# Patient Record
Sex: Female | Born: 1935 | Race: Black or African American | Hispanic: No | Smoking: Former smoker
Health system: Southern US, Community
[De-identification: ages and names within clinical notes are randomized; demographics above are authoritative.]

## PROBLEM LIST (undated history)

## (undated) DIAGNOSIS — E785 Hyperlipidemia, unspecified: Secondary | ICD-10-CM

## (undated) DIAGNOSIS — R0603 Acute respiratory distress: Secondary | ICD-10-CM

## (undated) DIAGNOSIS — Z87891 Personal history of nicotine dependence: Secondary | ICD-10-CM

## (undated) DIAGNOSIS — C50311 Malignant neoplasm of lower-inner quadrant of right female breast: Secondary | ICD-10-CM

## (undated) DIAGNOSIS — I340 Nonrheumatic mitral (valve) insufficiency: Secondary | ICD-10-CM

## (undated) DIAGNOSIS — I10 Essential (primary) hypertension: Secondary | ICD-10-CM

## (undated) DIAGNOSIS — E119 Type 2 diabetes mellitus without complications: Secondary | ICD-10-CM

## (undated) DIAGNOSIS — N189 Chronic kidney disease, unspecified: Secondary | ICD-10-CM

## (undated) DIAGNOSIS — I5042 Chronic combined systolic (congestive) and diastolic (congestive) heart failure: Principal | ICD-10-CM

## (undated) DIAGNOSIS — I272 Pulmonary hypertension, unspecified: Secondary | ICD-10-CM

## (undated) DIAGNOSIS — Z170 Estrogen receptor positive status [ER+]: Secondary | ICD-10-CM

## (undated) DIAGNOSIS — I219 Acute myocardial infarction, unspecified: Secondary | ICD-10-CM

## (undated) DIAGNOSIS — I2510 Atherosclerotic heart disease of native coronary artery without angina pectoris: Secondary | ICD-10-CM

## (undated) DIAGNOSIS — D649 Anemia, unspecified: Secondary | ICD-10-CM

## (undated) HISTORY — DX: Atherosclerotic heart disease of native coronary artery without angina pectoris: I25.10

## (undated) HISTORY — DX: Acute myocardial infarction, unspecified: I21.9

## (undated) HISTORY — PX: COLONOSCOPY: SHX5424

## (undated) HISTORY — DX: Pulmonary hypertension, unspecified: I27.20

## (undated) HISTORY — DX: Chronic combined systolic (congestive) and diastolic (congestive) heart failure: I50.42

## (undated) HISTORY — PX: EYE SURGERY: SHX253

## (undated) HISTORY — DX: Estrogen receptor positive status (ER+): Z17.0

## (undated) HISTORY — DX: Nonrheumatic mitral (valve) insufficiency: I34.0

## (undated) HISTORY — DX: Malignant neoplasm of lower-inner quadrant of right female breast: C50.311

---

## 1998-06-22 ENCOUNTER — Other Ambulatory Visit: Admission: RE | Admit: 1998-06-22 | Discharge: 1998-06-22 | Payer: Self-pay | Admitting: Gastroenterology

## 2000-02-11 ENCOUNTER — Encounter: Admission: RE | Admit: 2000-02-11 | Discharge: 2000-05-11 | Payer: Self-pay | Admitting: Orthopedic Surgery

## 2000-05-18 ENCOUNTER — Encounter: Admission: RE | Admit: 2000-05-18 | Discharge: 2000-08-16 | Payer: Self-pay | Admitting: Orthopedic Surgery

## 2000-08-25 ENCOUNTER — Encounter: Admission: RE | Admit: 2000-08-25 | Discharge: 2000-11-23 | Payer: Self-pay | Admitting: Orthopedic Surgery

## 2000-12-15 ENCOUNTER — Encounter: Admission: RE | Admit: 2000-12-15 | Discharge: 2000-12-21 | Payer: Self-pay | Admitting: Orthopedic Surgery

## 2001-03-23 ENCOUNTER — Encounter: Admission: RE | Admit: 2001-03-23 | Discharge: 2001-03-29 | Payer: Self-pay | Admitting: Orthopedic Surgery

## 2001-07-13 ENCOUNTER — Encounter: Admission: RE | Admit: 2001-07-13 | Discharge: 2001-07-19 | Payer: Self-pay | Admitting: Orthopedic Surgery

## 2001-10-19 ENCOUNTER — Encounter: Admission: RE | Admit: 2001-10-19 | Discharge: 2001-10-25 | Payer: Self-pay | Admitting: Orthopedic Surgery

## 2002-02-06 ENCOUNTER — Encounter: Admission: RE | Admit: 2002-02-06 | Discharge: 2002-02-11 | Payer: Self-pay | Admitting: Orthopedic Surgery

## 2002-05-23 ENCOUNTER — Encounter (HOSPITAL_BASED_OUTPATIENT_CLINIC_OR_DEPARTMENT_OTHER): Admission: RE | Admit: 2002-05-23 | Discharge: 2002-05-27 | Payer: Self-pay | Admitting: Orthopedic Surgery

## 2002-09-05 ENCOUNTER — Encounter (HOSPITAL_BASED_OUTPATIENT_CLINIC_OR_DEPARTMENT_OTHER): Admission: RE | Admit: 2002-09-05 | Discharge: 2002-12-04 | Payer: Self-pay | Admitting: Internal Medicine

## 2002-12-09 ENCOUNTER — Encounter (HOSPITAL_BASED_OUTPATIENT_CLINIC_OR_DEPARTMENT_OTHER): Admission: RE | Admit: 2002-12-09 | Discharge: 2003-03-09 | Payer: Self-pay | Admitting: Internal Medicine

## 2003-06-15 ENCOUNTER — Encounter (HOSPITAL_BASED_OUTPATIENT_CLINIC_OR_DEPARTMENT_OTHER): Admission: RE | Admit: 2003-06-15 | Discharge: 2003-06-20 | Payer: Self-pay | Admitting: Internal Medicine

## 2003-09-21 ENCOUNTER — Encounter (HOSPITAL_BASED_OUTPATIENT_CLINIC_OR_DEPARTMENT_OTHER): Admission: RE | Admit: 2003-09-21 | Discharge: 2003-10-03 | Payer: Self-pay | Admitting: Internal Medicine

## 2004-01-04 ENCOUNTER — Encounter (HOSPITAL_BASED_OUTPATIENT_CLINIC_OR_DEPARTMENT_OTHER): Admission: RE | Admit: 2004-01-04 | Discharge: 2004-01-15 | Payer: Self-pay | Admitting: Internal Medicine

## 2004-04-10 ENCOUNTER — Encounter (HOSPITAL_BASED_OUTPATIENT_CLINIC_OR_DEPARTMENT_OTHER): Admission: RE | Admit: 2004-04-10 | Discharge: 2004-04-24 | Payer: Self-pay | Admitting: Internal Medicine

## 2004-07-17 ENCOUNTER — Encounter (HOSPITAL_BASED_OUTPATIENT_CLINIC_OR_DEPARTMENT_OTHER): Admission: RE | Admit: 2004-07-17 | Discharge: 2004-07-26 | Payer: Self-pay | Admitting: Internal Medicine

## 2004-10-23 ENCOUNTER — Encounter (HOSPITAL_BASED_OUTPATIENT_CLINIC_OR_DEPARTMENT_OTHER): Admission: RE | Admit: 2004-10-23 | Discharge: 2004-11-08 | Payer: Self-pay | Admitting: Internal Medicine

## 2005-05-29 ENCOUNTER — Ambulatory Visit: Payer: Self-pay | Admitting: Gastroenterology

## 2005-06-11 ENCOUNTER — Ambulatory Visit: Payer: Self-pay | Admitting: Gastroenterology

## 2005-06-11 ENCOUNTER — Encounter (INDEPENDENT_AMBULATORY_CARE_PROVIDER_SITE_OTHER): Payer: Self-pay | Admitting: *Deleted

## 2010-02-26 ENCOUNTER — Ambulatory Visit: Payer: Self-pay | Admitting: Vascular Surgery

## 2010-02-26 ENCOUNTER — Encounter (INDEPENDENT_AMBULATORY_CARE_PROVIDER_SITE_OTHER): Payer: Self-pay | Admitting: Internal Medicine

## 2010-02-26 ENCOUNTER — Ambulatory Visit (HOSPITAL_COMMUNITY): Admission: RE | Admit: 2010-02-26 | Discharge: 2010-02-26 | Payer: Self-pay | Admitting: Internal Medicine

## 2012-11-06 ENCOUNTER — Other Ambulatory Visit: Payer: Self-pay

## 2012-11-06 ENCOUNTER — Inpatient Hospital Stay (HOSPITAL_COMMUNITY): Payer: Medicare Other

## 2012-11-06 ENCOUNTER — Inpatient Hospital Stay (HOSPITAL_COMMUNITY)
Admission: EM | Admit: 2012-11-06 | Discharge: 2012-11-12 | DRG: 280 | Disposition: A | Discharge status: Home Health | Payer: Medicare Other | Attending: Internal Medicine | Admitting: Internal Medicine

## 2012-11-06 ENCOUNTER — Emergency Department (HOSPITAL_COMMUNITY): Payer: Medicare Other

## 2012-11-06 ENCOUNTER — Encounter (HOSPITAL_COMMUNITY): Payer: Self-pay | Admitting: *Deleted

## 2012-11-06 DIAGNOSIS — R079 Chest pain, unspecified: Secondary | ICD-10-CM

## 2012-11-06 DIAGNOSIS — I959 Hypotension, unspecified: Secondary | ICD-10-CM | POA: Diagnosis present

## 2012-11-06 DIAGNOSIS — J9600 Acute respiratory failure, unspecified whether with hypoxia or hypercapnia: Secondary | ICD-10-CM | POA: Diagnosis present

## 2012-11-06 DIAGNOSIS — K5900 Constipation, unspecified: Secondary | ICD-10-CM | POA: Diagnosis not present

## 2012-11-06 DIAGNOSIS — I272 Pulmonary hypertension, unspecified: Secondary | ICD-10-CM

## 2012-11-06 DIAGNOSIS — N179 Acute kidney failure, unspecified: Secondary | ICD-10-CM | POA: Diagnosis present

## 2012-11-06 DIAGNOSIS — I130 Hypertensive heart and chronic kidney disease with heart failure and stage 1 through stage 4 chronic kidney disease, or unspecified chronic kidney disease: Secondary | ICD-10-CM | POA: Diagnosis present

## 2012-11-06 DIAGNOSIS — I2510 Atherosclerotic heart disease of native coronary artery without angina pectoris: Secondary | ICD-10-CM | POA: Diagnosis present

## 2012-11-06 DIAGNOSIS — I2589 Other forms of chronic ischemic heart disease: Secondary | ICD-10-CM | POA: Diagnosis present

## 2012-11-06 DIAGNOSIS — N189 Chronic kidney disease, unspecified: Secondary | ICD-10-CM | POA: Diagnosis present

## 2012-11-06 DIAGNOSIS — E785 Hyperlipidemia, unspecified: Secondary | ICD-10-CM | POA: Diagnosis present

## 2012-11-06 DIAGNOSIS — J810 Acute pulmonary edema: Secondary | ICD-10-CM

## 2012-11-06 DIAGNOSIS — I359 Nonrheumatic aortic valve disorder, unspecified: Secondary | ICD-10-CM | POA: Diagnosis present

## 2012-11-06 DIAGNOSIS — D509 Iron deficiency anemia, unspecified: Secondary | ICD-10-CM | POA: Diagnosis present

## 2012-11-06 DIAGNOSIS — E876 Hypokalemia: Secondary | ICD-10-CM | POA: Diagnosis not present

## 2012-11-06 DIAGNOSIS — J811 Chronic pulmonary edema: Secondary | ICD-10-CM

## 2012-11-06 DIAGNOSIS — E119 Type 2 diabetes mellitus without complications: Secondary | ICD-10-CM

## 2012-11-06 DIAGNOSIS — I509 Heart failure, unspecified: Secondary | ICD-10-CM

## 2012-11-06 DIAGNOSIS — I340 Nonrheumatic mitral (valve) insufficiency: Secondary | ICD-10-CM

## 2012-11-06 DIAGNOSIS — I10 Essential (primary) hypertension: Secondary | ICD-10-CM

## 2012-11-06 DIAGNOSIS — I214 Non-ST elevation (NSTEMI) myocardial infarction: Secondary | ICD-10-CM | POA: Diagnosis present

## 2012-11-06 DIAGNOSIS — I5042 Chronic combined systolic (congestive) and diastolic (congestive) heart failure: Secondary | ICD-10-CM | POA: Diagnosis present

## 2012-11-06 DIAGNOSIS — I5031 Acute diastolic (congestive) heart failure: Secondary | ICD-10-CM

## 2012-11-06 DIAGNOSIS — I129 Hypertensive chronic kidney disease with stage 1 through stage 4 chronic kidney disease, or unspecified chronic kidney disease: Secondary | ICD-10-CM | POA: Diagnosis present

## 2012-11-06 DIAGNOSIS — I5023 Acute on chronic systolic (congestive) heart failure: Principal | ICD-10-CM | POA: Diagnosis present

## 2012-11-06 DIAGNOSIS — Z79899 Other long term (current) drug therapy: Secondary | ICD-10-CM

## 2012-11-06 DIAGNOSIS — N289 Disorder of kidney and ureter, unspecified: Secondary | ICD-10-CM

## 2012-11-06 DIAGNOSIS — N184 Chronic kidney disease, stage 4 (severe): Secondary | ICD-10-CM | POA: Diagnosis present

## 2012-11-06 DIAGNOSIS — I5021 Acute systolic (congestive) heart failure: Secondary | ICD-10-CM

## 2012-11-06 DIAGNOSIS — I309 Acute pericarditis, unspecified: Secondary | ICD-10-CM | POA: Diagnosis present

## 2012-11-06 HISTORY — DX: Type 2 diabetes mellitus without complications: E11.9

## 2012-11-06 HISTORY — DX: Anemia, unspecified: D64.9

## 2012-11-06 HISTORY — DX: Hyperlipidemia, unspecified: E78.5

## 2012-11-06 HISTORY — DX: Essential (primary) hypertension: I10

## 2012-11-06 HISTORY — DX: Personal history of nicotine dependence: Z87.891

## 2012-11-06 HISTORY — DX: Chronic kidney disease, unspecified: N18.9

## 2012-11-06 LAB — MRSA PCR SCREENING: MRSA by PCR: NEGATIVE

## 2012-11-06 LAB — POCT I-STAT, CHEM 8
BUN: 43 mg/dL — ABNORMAL HIGH (ref 6–23)
Creatinine, Ser: 1.7 mg/dL — ABNORMAL HIGH (ref 0.50–1.10)
Hemoglobin: 8.5 g/dL — ABNORMAL LOW (ref 12.0–15.0)
Potassium: 5.3 mEq/L — ABNORMAL HIGH (ref 3.5–5.1)
Sodium: 141 mEq/L (ref 135–145)

## 2012-11-06 LAB — CBC WITH DIFFERENTIAL/PLATELET
Basophils Relative: 0 % (ref 0–1)
Hemoglobin: 7 g/dL — ABNORMAL LOW (ref 12.0–15.0)
Lymphocytes Relative: 18 % (ref 12–46)
Lymphs Abs: 1 10*3/uL (ref 0.7–4.0)
Monocytes Relative: 9 % (ref 3–12)
Neutro Abs: 3.8 10*3/uL (ref 1.7–7.7)
Neutrophils Relative %: 71 % (ref 43–77)
RBC: 2.81 MIL/uL — ABNORMAL LOW (ref 3.87–5.11)
WBC: 5.3 10*3/uL (ref 4.0–10.5)

## 2012-11-06 LAB — POCT I-STAT 3, VENOUS BLOOD GAS (G3P V)
O2 Saturation: 49 %
pCO2, Ven: 59 mmHg — ABNORMAL HIGH (ref 45.0–50.0)
pO2, Ven: 30 mmHg (ref 30.0–45.0)

## 2012-11-06 LAB — CREATININE, SERUM
Creatinine, Ser: 1.67 mg/dL — ABNORMAL HIGH (ref 0.50–1.10)
GFR calc non Af Amer: 28 mL/min — ABNORMAL LOW (ref >90)

## 2012-11-06 LAB — CBC
MCH: 24.7 pg — ABNORMAL LOW (ref 26.0–34.0)
MCHC: 31.1 g/dL (ref 30.0–36.0)
Platelets: 172 10*3/uL (ref 150–400)
RBC: 2.95 MIL/uL — ABNORMAL LOW (ref 3.87–5.11)

## 2012-11-06 LAB — GLUCOSE, CAPILLARY
Glucose-Capillary: 157 mg/dL — ABNORMAL HIGH (ref 70–99)
Glucose-Capillary: 148 mg/dL — ABNORMAL HIGH (ref 70–99)

## 2012-11-06 LAB — POCT I-STAT TROPONIN I: Troponin i, poc: 0.05 ng/mL (ref 0.00–0.08)

## 2012-11-06 MED ORDER — ASPIRIN 81 MG PO CHEW
81.0000 mg | CHEWABLE_TABLET | Freq: Every day | ORAL | Status: DC
  Administered 2012-11-06 – 2012-11-09 (×4): 81 mg via ORAL
  Filled 2012-11-06: qty 4
  Filled 2012-11-06 (×4): qty 1

## 2012-11-06 MED ORDER — CHLORTHALIDONE 25 MG PO TABS
25.0000 mg | ORAL_TABLET | Freq: Every day | ORAL | Status: DC
  Administered 2012-11-06 – 2012-11-07 (×2): 25 mg via ORAL
  Filled 2012-11-06 (×2): qty 1

## 2012-11-06 MED ORDER — HEPARIN SODIUM (PORCINE) 5000 UNIT/ML IJ SOLN
5000.0000 [IU] | Freq: Three times a day (TID) | INTRAMUSCULAR | Status: DC
  Filled 2012-11-06 (×6): qty 1

## 2012-11-06 MED ORDER — MORPHINE SULFATE 2 MG/ML IJ SOLN: 1.0000 mg | INTRAMUSCULAR | Status: DC | PRN

## 2012-11-06 MED ORDER — FUROSEMIDE 10 MG/ML IJ SOLN
40.0000 mg | Freq: Once | INTRAMUSCULAR | Status: AC
  Administered 2012-11-06: 40 mg via INTRAVENOUS
  Filled 2012-11-06: qty 4

## 2012-11-06 MED ORDER — SODIUM CHLORIDE 0.9 % IJ SOLN: 3.0000 mL | INTRAMUSCULAR | Status: DC | PRN

## 2012-11-06 MED ORDER — GABAPENTIN 300 MG PO CAPS
300.0000 mg | ORAL_CAPSULE | Freq: Three times a day (TID) | ORAL | Status: DC
  Administered 2012-11-06 – 2012-11-12 (×20): 300 mg via ORAL
  Filled 2012-11-06 (×24): qty 1

## 2012-11-06 MED ORDER — AMLODIPINE BESYLATE 10 MG PO TABS
10.0000 mg | ORAL_TABLET | Freq: Every day | ORAL | Status: DC
  Administered 2012-11-06 – 2012-11-07 (×2): 10 mg via ORAL
  Filled 2012-11-06 (×2): qty 1

## 2012-11-06 MED ORDER — LEVOTHYROXINE SODIUM 50 MCG PO TABS
50.0000 ug | ORAL_TABLET | Freq: Every day | ORAL | Status: DC
  Administered 2012-11-06 – 2012-11-12 (×7): 50 ug via ORAL
  Filled 2012-11-06 (×8): qty 1

## 2012-11-06 MED ORDER — SIMVASTATIN 20 MG PO TABS
20.0000 mg | ORAL_TABLET | Freq: Every evening | ORAL | Status: DC
  Administered 2012-11-06 – 2012-11-07 (×2): 20 mg via ORAL
  Filled 2012-11-06 (×4): qty 1

## 2012-11-06 MED ORDER — OXYCODONE HCL 5 MG PO TABS
5.0000 mg | ORAL_TABLET | ORAL | Status: DC | PRN
  Administered 2012-11-10: 10 mg via ORAL
  Filled 2012-11-06: qty 1
  Filled 2012-11-06: qty 2

## 2012-11-06 MED ORDER — INSULIN ASPART 100 UNIT/ML ~~LOC~~ SOLN
0.0000 [IU] | Freq: Three times a day (TID) | SUBCUTANEOUS | Status: DC
  Administered 2012-11-06: 2 [IU] via SUBCUTANEOUS
  Administered 2012-11-08: 5 [IU] via SUBCUTANEOUS
  Administered 2012-11-09 – 2012-11-10 (×2): 2 [IU] via SUBCUTANEOUS
  Administered 2012-11-10: 3 [IU] via SUBCUTANEOUS
  Administered 2012-11-11: 2 [IU] via SUBCUTANEOUS
  Administered 2012-11-12: 1 [IU] via SUBCUTANEOUS
  Administered 2012-11-12: 2 [IU] via SUBCUTANEOUS

## 2012-11-06 MED ORDER — FUROSEMIDE 10 MG/ML IJ SOLN
60.0000 mg | Freq: Two times a day (BID) | INTRAMUSCULAR | Status: DC
  Administered 2012-11-06 – 2012-11-07 (×2): 60 mg via INTRAVENOUS
  Filled 2012-11-06 (×3): qty 6

## 2012-11-06 MED ORDER — DOXAZOSIN MESYLATE 4 MG PO TABS
4.0000 mg | ORAL_TABLET | Freq: Every day | ORAL | Status: DC
  Administered 2012-11-06: 4 mg via ORAL
  Filled 2012-11-06 (×2): qty 1

## 2012-11-06 MED ORDER — METFORMIN HCL 500 MG PO TABS: 1000.0000 mg | ORAL_TABLET | Freq: Two times a day (BID) | ORAL | Status: DC

## 2012-11-06 MED ORDER — ENOXAPARIN SODIUM 80 MG/0.8ML ~~LOC~~ SOLN
1.0000 mg/kg | SUBCUTANEOUS | Status: DC
  Administered 2012-11-06 – 2012-11-08 (×3): 65 mg via SUBCUTANEOUS
  Filled 2012-11-06 (×3): qty 0.8

## 2012-11-06 MED ORDER — ACETAMINOPHEN 325 MG PO TABS
650.0000 mg | ORAL_TABLET | Freq: Four times a day (QID) | ORAL | Status: DC | PRN
  Filled 2012-11-06: qty 2

## 2012-11-06 MED ORDER — SODIUM CHLORIDE 0.9 % IV SOLN: 250.0000 mL | INTRAVENOUS | Status: DC | PRN

## 2012-11-06 MED ORDER — LISINOPRIL 20 MG PO TABS: 40.0000 mg | ORAL_TABLET | Freq: Every day | ORAL | Status: DC

## 2012-11-06 MED ORDER — SODIUM CHLORIDE 0.9 % IJ SOLN
3.0000 mL | Freq: Two times a day (BID) | INTRAMUSCULAR | Status: DC
  Administered 2012-11-06 – 2012-11-08 (×5): 3 mL via INTRAVENOUS

## 2012-11-06 MED ORDER — SODIUM CHLORIDE 0.9 % IJ SOLN: 3.0000 mL | Freq: Two times a day (BID) | INTRAMUSCULAR | Status: DC

## 2012-11-06 MED ORDER — ATENOLOL 50 MG PO TABS
50.0000 mg | ORAL_TABLET | Freq: Every day | ORAL | Status: DC
  Administered 2012-11-06 – 2012-11-08 (×3): 50 mg via ORAL
  Filled 2012-11-06 (×4): qty 1

## 2012-11-06 NOTE — Progress Notes (Signed)
[facility] Progress Note [place] TEAM 1 - Stepdown/ICU TEAM   Mandy Larson [id number] DOB: [birth year] DOA: 11/06/2012 PCP: No primary provider on file.  Brief narrative: 78 y.o. female who presented with complaint of shortness of breath (SOB) onset over the past month. Her SOB was worse with exertion and better at rest, associated with L sided chest pain that was worse with exertion and better at rest. SOB also worse while lying down and better sitting up. Despite her family urging her for the past month to see a doctor she resisted until they called EMS who found patient in her house in the bathroom struggling to breath. Her initial O2 sat was 70% so they initated CPAP which brought her O2 to 99%. They gave her 1 sublingual nitro.  In the ED they gave her 40 of lasix twice with only minimal to moderate urine output. Her breathing did improve significantly on the BIPAP, but when they tried to wean it off she desaturated back down into the 80s on a NRB.   Assessment/Plan:  Pulmonary edema  Acute hypoxic resp failure  Troponin elevation  Chest pain Resolved at this time  Renal insufficiency  HTN  DM  Code Status: FULL Family Communication: No family present Disposition Plan: Step down unit  Consultants: None  Procedures: None  Antibiotics: None  DVT prophylaxis: Subcutaneous heparin  HPI/Subjective: Patient seen for followup visit   Objective: Blood pressure 113/66, pulse 65, temperature 98.6 F (37 C), temperature source Axillary, resp. rate 17, height 5\' 3"  (1.6 m), weight 64 kg (141 lb 1.5 oz), SpO2 99.00%.  Intake/Output Summary (Last 24 hours) at 11/06/12 1506 Last data filed at 11/06/12 1300  Gross per 24 hour  Intake    310 ml  Output   1450 ml  Net  -1140 ml     Exam: Followup exam completed  Data Reviewed: Basic Metabolic Panel:  Lab [id number] 0607 11/06/12 0205  NA -- 141  K -- 5.3*  CL -- 106  CO2 -- --  GLUCOSE -- 146*    BUN -- 43*  CREATININE 1.67* 1.70*  CALCIUM -- --  MG -- --  PHOS -- --   CBC:  Lab 11/06/12 0607 11/06/12 0205 11/06/12 0203  WBC 4.3 -- 5.3  NEUTROABS -- -- 3.8  HGB 7.3* 8.5* 7.0*  HCT 23.5* 25.0* 22.6*  MCV 79.7 -- 80.4  PLT 172 -- 185   Cardiac Enzymes:  Lab 11/06/12 0607  CKTOTAL --  CKMB --  CKMBINDEX --  TROPONINI 1.10*   BNP (last 3 results)  Basename 11/06/12 0203  PROBNP 6428.0*   CBG:  Lab 11/06/12 1130 11/06/12 0740  GLUCAP 160* 157*    Recent Results (from the past 240 hour(s))  MRSA PCR SCREENING     Status: Normal   Collection Time   11/06/12  8:04 AM      Component Value Range Status Comment   MRSA by PCR NEGATIVE  NEGATIVE Final      Studies:  Recent x-ray studies have been reviewed in detail by the Attending Physician  Scheduled Meds:  Reviewed in detail by the Attending Physician   Cherene Altes, MD [facility] Office  [phone] Pager [phone]  On-Call/Text Page:      [email or web address]      password [email or web address]  If 7PM-7AM, please contact night-coverage [email or web address] Password [email or web address] 11/06/2012, 3:06 PM   LOS: 0 days

## 2012-11-06 NOTE — ED Notes (Signed)
Respiratory at bedside for CPAP

## 2012-11-06 NOTE — ED Notes (Signed)
Admitting MD at bedside.

## 2012-11-06 NOTE — ED Notes (Addendum)
Per EMS: arrived to pt hourse and pt was in the bathroom in tripod position.  SOB x 2 hours.  Diminished in right lower quadrants, clear in left lungs.  O2 70% on nebulizer tx, with CPAP sats 99%. C/o CP, given 1 SL nitro.  RBBB found on EKG.  Pt received 125 mg Solumedrol, 5 mg Albuterol.

## 2012-11-06 NOTE — H&P (Addendum)
[facility] History and Physical  Mandy Larson [id number] DOB: [birth year] DOA: 11/06/2012  Referring physician: ED PCP: No primary provider on file.  Specialists: None  Chief Complaint: Shortness of breath  HPI: Mandy Larson is a 78 y.o. female who presents with complaint of shortness of breath (SOB) onset over the past month.  Her SOB is worse with exertion and better at rest.  Is associated with L sided chest pain that is worse with exertion and better at rest.  SOB is also worse while lying down and better sitting up.  Despite her family urging her for the past month to go see a doctor she resisted until earlier this evening they called EMS who found patient in her house in the bathroom struggling to breath.  Her initial O2 sat was 70% on a nebulizer treatment so they initated CPAP which brought her O2 to 99%.  They gave her 1 sublingual nitro no improvement.  In the ED they gave her 40 of lasix twice with only minimal to moderate urine output.  Her breathing did improve significantly on the BIPAP, but when they tried to wean it off she desaturated back down into the 80s on a NRB.  Hospitalist has been asked to admit for pulmonary edema.  Review of Systems: Patient denies: recent fever, chills, cough, coughing up sputum, body aches, muscle aches, flu like symptoms, admits to: chest pain, dyspnea on exertion, orthopnea, 12 systems reviewed and otherwise negative.  Past Medical History  Diagnosis Date  . Diabetes mellitus without complication    No past surgical history on file. Social History:  does not have a smoking history on file. She does not have any smokeless tobacco history on file. Her alcohol and drug histories not on file. Patient lives at home and performs all ADLs.   Allergies not on file  No family history on file. Husband is alive and well and in the room.  Prior to Admission medications   Medication Sig Start Date End Date Taking? Authorizing Provider    atenolol (TENORMIN) 50 MG tablet Take 50 mg by mouth daily.   Yes Historical Provider, MD  chlorthalidone (HYGROTON) 25 MG tablet Take 25 mg by mouth daily.   Yes Historical Provider, MD  doxazosin (CARDURA) 4 MG tablet Take 4 mg by mouth at bedtime.   Yes Historical Provider, MD  gabapentin (NEURONTIN) 300 MG capsule Take 300 mg by mouth 3 (three) times daily.   Yes Historical Provider, MD  levothyroxine (SYNTHROID, LEVOTHROID) 50 MCG tablet Take 50 mcg by mouth daily.   Yes Historical Provider, MD  lisinopril (PRINIVIL,ZESTRIL) 40 MG tablet Take 40 mg by mouth daily.   Yes Historical Provider, MD  metFORMIN (GLUCOPHAGE) 500 MG tablet Take 1,000 mg by mouth 2 (two) times daily with a meal.   Yes Historical Provider, MD  simvastatin (ZOCOR) 20 MG tablet Take 20 mg by mouth every evening.   Yes Historical Provider, MD  amLODipine (NORVASC) 10 MG tablet Take 10 mg by mouth daily.    Historical Provider, MD   Physical Exam: Filed Vitals:   11/06/12 0215 11/06/12 0245 11/06/12 0315 11/06/12 0330  BP: 129/64 126/62 126/64 126/81  Pulse: 73 72 67 68  Temp:      TempSrc:      Resp: 17 23 13 22   SpO2: 98% 95% 95% 97%     General:  NAD, resting comfortably in bed  Eyes: PEERLA EOMI  ENT: mucous membranes moist  Neck: supple w/o JVD  Cardiovascular: RRR w/o MRG  Respiratory: CTA B  Abdomen: soft, nt, nd, bs+  Skin: no rash nor lesion  Musculoskeletal: MAE, full ROM all 4 extremities  Psychiatric: normal tone and affect  Neurologic: AAOx3, grossly non-focal   Labs on Admission:  Basic Metabolic Panel:  Lab [id number] 0205  NA 141  K 5.3*  CL 106  CO2 --  GLUCOSE 146*  BUN 43*  CREATININE 1.70*  CALCIUM --  MG --  PHOS --   Liver Function Tests: No results found for this basename: AST:5,ALT:5,ALKPHOS:5,BILITOT:5,PROT:5,ALBUMIN:5 in the last 168 hours No results found for this basename: LIPASE:5,AMYLASE:5 in the last 168 hours No results found for this basename:  AMMONIA:5 in the last 168 hours CBC:  Lab 11/06/12 0205 11/06/12 0203  WBC -- 5.3  NEUTROABS -- 3.8  HGB 8.5* 7.0*  HCT 25.0* 22.6*  MCV -- 80.4  PLT -- 185   Cardiac Enzymes: No results found for this basename: CKTOTAL:5,CKMB:5,CKMBINDEX:5,TROPONINI:5 in the last 168 hours  BNP (last 3 results)  Basename 11/06/12 0203  PROBNP 6428.0*   CBG: No results found for this basename: GLUCAP:5 in the last 168 hours  Radiological Exams on Admission: Dg Chest Portable 1 View  11/06/2012  *RADIOLOGY REPORT*  Clinical Data: Respiratory distress.  PORTABLE CHEST - 1 VIEW  Comparison: None.  Findings: The lungs are relatively well expanded.  Bibasilar airspace opacities may reflect pneumonia or possibly pulmonary edema, given underlying vascular congestion and small bilateral pleural effusions.  No pneumothorax is seen.  The cardiomediastinal silhouette is enlarged.  Calcification is noted in the aortic arch.  No acute osseous abnormalities are identified.  IMPRESSION: Bibasilar airspace opacities may reflect pneumonia or possibly pulmonary edema, given underlying vascular congestion, cardiomegaly, and small bilateral pleural effusions.   Original Report Authenticated By: Santa Lighter, M.D.     EKG: Independently reviewed. Demonstrates a RBBB, no prior EKG available to compare with.  Assessment/Plan Principal Problem:  *Pulmonary edema Active Problems:  Chest pain  HTN (hypertension)  DM2 (diabetes mellitus, type 2)  Renal insufficiency   1. Pulmonary edema - new in the last month with episodes of crushing chest pain worse on exertion and better at rest on the left side of her chest.  I am very suspicious the patient may have CAD and even had MIs in the past few weeks since she had no prior history of CHF previously, this could alternatively be a hypertensive cardiomyopathy but it does sound rather sudden in onset by history.  Probably will end up needing to see a cardiologist / get a  heart cath (if they feel the latter is appropriate and indicated).  Lasix 60 q 12h IV ordered.  If BP rises may also benefit from Nitro Gtt 2. Chest pain - as above, continuing to check troponins, dont think she is having an active MI at this time but do think she probably did have one or several in the past couple of weeks. 3. Renal insufficiency - hold lisinopril, hold metformin, unclear wether this is acute or chronic in this patient, she is on metformin at baseline, strict I's and O's, patient is clearly volume overloaded so do not feel that this is due to volume depletion.  Could also be some element of cardio-renal syndrome if the patient does indeed have low cardiac output.  Also evaluating for RAS with renal ultrasound given the fact that she takes 5 HTN meds at home. 4. HTN - patient on multiple home medications, will continue these  as her BP is in the 120s in the ED at this time except for lisinopril as above. 5. DM2 - hold metformin and put on SSI.  No consults called.  Code Status: Full Code (must indicate code status--if unknown or must be presumed, indicate so) Family Communication: Spoke with husband at bedside (indicate person spoken with, if applicable, with phone number if by telephone) Disposition Plan: Admit to stepdown, inpatient (indicate anticipated LOS)  Time spent: 70 min  GARDNER, JARED M. [facility] Pager [phone]  If 7PM-7AM, please contact night-coverage [email or web address] Password [facility] 11/06/2012, 6:14 AM

## 2012-11-06 NOTE — Plan of Care (Signed)
Problem: Phase I Progression Outcomes Goal: Other Phase I Outcomes/Goals Outcome: Progressing Sats maintained >90% off bipap. Tolerating light diet. Pt c/o LE intermittent LE cramping. During cramp event pt found OOB standing to relieve pain. Bed alarm on. Family updated on POC.

## 2012-11-06 NOTE — Progress Notes (Signed)
PT BROUGHT IN ON CPAP AND PLACE4D ON OUR MACHINE AT THIS TIME ON ABOVE SETTINGS.

## 2012-11-06 NOTE — Progress Notes (Signed)
ANTICOAGULATION CONSULT NOTE - Initial Consult  Pharmacy Consult for lovenox Indication: chest pain/ACS  No Known Allergies  Patient Measurements: Height: 5\' 3"  (160 cm) Weight: 141 lb 1.5 oz (64 kg) IBW/kg (Calculated) : 52.4   Vital Signs: Temp: 98.6 F (37 C) (12/28 1230) Temp src: Axillary (12/28 1128) BP: 113/66 mmHg (12/28 1230) Pulse Rate: 65  (12/28 1230)  Labs:  Basename 11/06/12 1216 11/06/12 0607 11/06/12 0205 11/06/12 0203  HGB -- 7.3* 8.5* --  HCT -- 23.5* 25.0* 22.6*  PLT -- 172 -- 185  APTT -- -- -- --  LABPROT -- -- -- --  INR -- -- -- --  HEPARINUNFRC -- -- -- --  CREATININE -- 1.67* 1.70* --  CKTOTAL -- -- -- --  CKMB -- -- -- --  TROPONINI 0.92* 1.10* -- --    Estimated Creatinine Clearance: 24.2 ml/min (by C-G formula based on Cr of 1.67).   Medical History: Past Medical History  Diagnosis Date  . Diabetes mellitus without complication     Medications:  Scheduled:    . amLODipine  10 mg Oral Daily  . aspirin  81 mg Oral Daily  . atenolol  50 mg Oral Daily  . chlorthalidone  25 mg Oral Daily  . doxazosin  4 mg Oral QHS  . [COMPLETED] furosemide  40 mg Intravenous Once  . [COMPLETED] furosemide  40 mg Intravenous Once  . furosemide  60 mg Intravenous Q12H  . gabapentin  300 mg Oral TID  . insulin aspart  0-9 Units Subcutaneous TID WC  . levothyroxine  50 mcg Oral QAC breakfast  . simvastatin  20 mg Oral QPM  . sodium chloride  3 mL Intravenous Q12H  . [DISCONTINUED] heparin  5,000 Units Subcutaneous Q8H  . [DISCONTINUED] lisinopril  40 mg Oral Daily  . [DISCONTINUED] metFORMIN  1,000 mg Oral BID WC  . [DISCONTINUED] sodium chloride  3 mL Intravenous Q12H    Assessment: 78 yo who was admitted for CP. Has an elevated troponin. Lovenox has been ordered to r/o MI.  Goal of Therapy:  Anti-Xa level 0.6-1.2 units/ml 4hrs after LMWH dose given Monitor platelets by anticoagulation protocol: Yes   Plan:  Lovenox 65mg  SQ q24 CBC  q3days  Onnie Boer [place] 11/06/2012,3:40 PM

## 2012-11-06 NOTE — ED Provider Notes (Addendum)
History     CSN: [id number]  Arrival date & time 11/06/12  0146   None     Chief Complaint  Patient presents with  . Respiratory Distress    (Consider location/radiation/quality/duration/timing/severity/associated sxs/prior treatment) HPI Level 5 Caveat: on CPAP, respiratory distress. This is an 78 year old female with about a one-month history of shortness of breath. She had not sought medical attention for this. She is here with an acute worsening of the shortness of breath that began about 2 hours ago. The symptoms were severe. EMS found her to be in the bathroom sitting in a tripod position in obvious distress. Her oxygen saturation was 76% on room air. It came up to 85% on a nonrebreather. Due to continued hypoxia and distress she was placed on CPAP. On CPAP her oxygen came up to 99%. Her dyspnea has improved although she is still tachypneic. She did have some chest pain earlier and was given one sublingual nitroglycerin. She longer has chest pain. She was given Solu-Medrol 125 mg IV and started on an albuterol neb treatment prior to arrival. EKG done prior to arrival showed a right bundle branch block.  Past Medical History  Diagnosis Date  . Diabetes mellitus without complication     No past surgical history on file.  No family history on file.  History  Substance Use Topics  . Smoking status: Not on file  . Smokeless tobacco: Not on file  . Alcohol Use:     OB History    Grav Para Term Preterm Abortions TAB SAB Ect Mult Living                  Review of Systems  Unable to perform ROS   Allergies  Review of patient's allergies indicates not on file.  Home Medications  No current outpatient prescriptions on file.  BP 129/64  Pulse 73  Temp 96.7 F (35.9 C) (Rectal)  Resp 17  SpO2 98%  Physical Exam General: Well-developed, well-nourished female in mild distress; appearance consistent with age of record HENT: normocephalic, atraumatic Eyes: pupils  equal round and reactive to light; extraocular muscles intact; arcus senilis bilaterally Neck: supple Heart: regular rate and rhythm Lungs: On CPAP; diminished sounds on the right, rales left base; tachypnea Abdomen: soft; nondistended; nontender; bowel sounds present Extremities: No deformity; full range of motion; pulses normal Neurologic: Awake, alert; motor function intact in all extremities and symmetric; no facial droop Skin: Warm and dry    ED Course  Procedures (including critical care time)  CRITICAL CARE Performed by: Kiri Hinderliter L   Total critical care time: 30 minutes  Critical care time was exclusive of separately billable procedures and treating other patients.  Critical care was necessary to treat or prevent imminent or life-threatening deterioration.  Critical care was time spent personally by me on the following activities: development of treatment plan with patient and/or surrogate as well as nursing, discussions with consultants, evaluation of patient's response to treatment, examination of patient, obtaining history from patient or surrogate, ordering and performing treatments and interventions, ordering and review of laboratory studies, ordering and review of radiographic studies, pulse oximetry and re-evaluation of patient's condition.    MDM   Nursing notes and vitals signs, including pulse oximetry, reviewed.  Summary of this visit's results, reviewed by myself:  Labs:  Results for orders placed during the hospital encounter of 11/06/12 (from the past 24 hour(s))  CBC WITH DIFFERENTIAL     Status: Abnormal   Collection Time  11/06/12  2:03 AM      Component Value Range   WBC 5.3  4.0 - 10.5 K/uL   RBC 2.81 (*) 3.87 - 5.11 MIL/uL   Hemoglobin 7.0 (*) 12.0 - 15.0 g/dL   HCT 22.6 (*) 36.0 - 46.0 %   MCV 80.4  78.0 - 100.0 fL   MCH 24.9 (*) 26.0 - 34.0 pg   MCHC 31.0  30.0 - 36.0 g/dL   RDW 16.6 (*) 11.5 - 15.5 %   Platelets 185  150 - 400 K/uL    Neutrophils Relative 71  43 - 77 %   Neutro Abs 3.8  1.7 - 7.7 K/uL   Lymphocytes Relative 18  12 - 46 %   Lymphs Abs 1.0  0.7 - 4.0 K/uL   Monocytes Relative 9  3 - 12 %   Monocytes Absolute 0.5  0.1 - 1.0 K/uL   Eosinophils Relative 2  0 - 5 %   Eosinophils Absolute 0.1  0.0 - 0.7 K/uL   Basophils Relative 0  0 - 1 %   Basophils Absolute 0.0  0.0 - 0.1 K/uL  PRO B NATRIURETIC PEPTIDE     Status: Abnormal   Collection Time   11/06/12  2:03 AM      Component Value Range   Pro B Natriuretic peptide (BNP) 6428.0 (*) 0 - 450 pg/mL  POCT I-STAT, CHEM 8     Status: Abnormal   Collection Time   11/06/12  2:05 AM      Component Value Range   Sodium 141  135 - 145 mEq/L   Potassium 5.3 (*) 3.5 - 5.1 mEq/L   Chloride 106  96 - 112 mEq/L   BUN 43 (*) 6 - 23 mg/dL   Creatinine, Ser 1.70 (*) 0.50 - 1.10 mg/dL   Glucose, Bld 146 (*) 70 - 99 mg/dL   Calcium, Ion 1.19  1.13 - 1.30 mmol/L   TCO2 29  0 - 100 mmol/L   Hemoglobin 8.5 (*) 12.0 - 15.0 g/dL   HCT 25.0 (*) 36.0 - 46.0 %  POCT I-STAT 3, BLOOD GAS (G3P V)     Status: Abnormal   Collection Time   11/06/12  2:20 AM      Component Value Range   pH, Ven 7.296  7.250 - 7.300   pCO2, Ven 59.0 (*) 45.0 - 50.0 mmHg   pO2, Ven 30.0  30.0 - 45.0 mmHg   Bicarbonate 28.8 (*) 20.0 - 24.0 mEq/L   TCO2 31  0 - 100 mmol/L   O2 Saturation 49.0     Acid-Base Excess 2.0  0.0 - 2.0 mmol/L   Sample type VENOUS     Comment NOTIFIED PHYSICIAN    POCT I-STAT TROPONIN I     Status: Normal   Collection Time   11/06/12  2:32 AM      Component Value Range   Troponin i, poc 0.05  0.00 - 0.08 ng/mL   Comment 3             Imaging Studies: Dg Chest Portable 1 View  11/06/2012  *RADIOLOGY REPORT*  Clinical Data: Respiratory distress.  PORTABLE CHEST - 1 VIEW  Comparison: None.  Findings: The lungs are relatively well expanded.  Bibasilar airspace opacities may reflect pneumonia or possibly pulmonary edema, given underlying vascular congestion and small  bilateral pleural effusions.  No pneumothorax is seen.  The cardiomediastinal silhouette is enlarged.  Calcification is noted in the aortic arch.  No  acute osseous abnormalities are identified.  IMPRESSION: Bibasilar airspace opacities may reflect pneumonia or possibly pulmonary edema, given underlying vascular congestion, cardiomegaly, and small bilateral pleural effusions.   Original Report Authenticated By: Santa Lighter, M.D.       EKG Interpretation:  Date & Time: 11/06/2012 1:53 AM  Rate: 77  Rhythm: normal sinus rhythm  QRS Axis: normal  Intervals: normal  ST/T Wave abnormalities: normal  Conduction Disutrbances:right bundle branch block  Narrative Interpretation:   Old EKG Reviewed: none available  2:10 AM Patient placed on BiPAP on arrival. Patient has no history of COPD or congestive heart failure. Her chest x-ray shows changes consistent with asymmetric pulmonary edema. Lasix 40 mg IV has been ordered.  3:23 AM No urine output despite Lasix 40 mg IV. Will repeat Lasix. Patient failed trial off BiPAP. We will have her admitted.  5:00 AM Urine output about 375 mL after second dose of IV Lasix.        Wynetta Fines, MD 11/06/12 [id number]  Wynetta Fines, MD 11/06/12 [place], MD 11/06/12 0500

## 2012-11-06 NOTE — Progress Notes (Signed)
Trial on [place] at 6 lpm but sats dropped to 88%, replaced bipap at this time.

## 2012-11-07 DIAGNOSIS — I10 Essential (primary) hypertension: Secondary | ICD-10-CM

## 2012-11-07 LAB — BASIC METABOLIC PANEL
BUN: 43 mg/dL — ABNORMAL HIGH (ref 6–23)
GFR calc Af Amer: 32 mL/min — ABNORMAL LOW (ref >90)
GFR calc non Af Amer: 27 mL/min — ABNORMAL LOW (ref >90)
Potassium: 3.7 mEq/L (ref 3.5–5.1)

## 2012-11-07 LAB — CBC
MCH: 25.2 pg — ABNORMAL LOW (ref 26.0–34.0)
MCHC: 32 g/dL (ref 30.0–36.0)
Platelets: 167 10*3/uL (ref 150–400)
RBC: 2.86 MIL/uL — ABNORMAL LOW (ref 3.87–5.11)

## 2012-11-07 MED ORDER — NITROGLYCERIN 2 % TD OINT
0.5000 [in_us] | TOPICAL_OINTMENT | Freq: Four times a day (QID) | TRANSDERMAL | Status: DC
  Administered 2012-11-07 – 2012-11-08 (×3): 0.5 [in_us] via TOPICAL
  Filled 2012-11-07 (×2): qty 30

## 2012-11-07 MED ORDER — DOXAZOSIN MESYLATE 2 MG PO TABS
2.0000 mg | ORAL_TABLET | Freq: Every day | ORAL | Status: DC
  Administered 2012-11-07: 2 mg via ORAL
  Filled 2012-11-07 (×2): qty 1

## 2012-11-07 MED ORDER — FUROSEMIDE 10 MG/ML IJ SOLN
40.0000 mg | Freq: Two times a day (BID) | INTRAMUSCULAR | Status: DC
  Administered 2012-11-07 – 2012-11-09 (×4): 40 mg via INTRAVENOUS
  Filled 2012-11-07 (×5): qty 4

## 2012-11-07 NOTE — Plan of Care (Signed)
Problem: Phase II Progression Outcomes Goal: IV changed to normal saline lock Outcome: Not Applicable Date Met:  0000000 Pt tolerated OOB in chair for 4 hours; assisted with ADLs. C/o left chest pain, resolved with repositioning back to bed. Will monitor.

## 2012-11-07 NOTE — Progress Notes (Signed)
  Echocardiogram 2D Echocardiogram has been performed.  Esker Dever FRANCES 11/07/2012, 5:10 PM

## 2012-11-07 NOTE — Progress Notes (Signed)
[facility] Progress Note [place] TEAM 1 - Stepdown/ICU TEAM   Maize Lynk [id number] DOB: Feb 09, 1930 DOA: 11/06/2012 PCP: No primary provider on file.  Brief narrative: 78 y.o. female who presented with complaint of shortness of breath (SOB) onset over the past month. Her SOB was worse with exertion and better at rest, associated with L sided chest pain that was worse with exertion and better at rest. SOB also worse while lying down and better sitting up. Despite her family urging her for the past month to see a doctor she resisted until they called EMS who found patient in her house in the bathroom struggling to breath. Her initial O2 sat was 70% so they initated CPAP which brought her O2 to 99%. They gave her 1 sublingual nitro.  In the ED they gave her 40 of lasix twice with only minimal to moderate urine output. Her breathing did improve significantly on the BIPAP, but when they tried to wean it off she desaturated back down into the 80s on a NRB.   Assessment/Plan:  Pulmonary edema Likely the result of newly diagnosed congestive heart failure - clinically patient is stabilizing though slowly - further evaluation will be determined based upon the results of the pending echocardiogram - patient is negative net 3 L thus far  Acute hypoxic resp failure Due to above - patient required BiPAP again last night - we'll attempt to wean to nasal cannula today - continue diuresis  Troponin elevation Mild - is likely due to CHF exacerbation but actual NSTEMI is certainly a possibility - risk stratification for coronary artery disease will likely be indicated if systolic congestive heart failure noted on echocardiogram - empiric medical therapy to continue  Chest pain Resolved at this time - troponin only mildly elevated - EKG without acute ST segment changes - see discussion above concerning eventual risk stratification  Renal insufficiency Creatinine is stable in face of diuresis -  continue to follow  Normocytic anemia No clear evidence of blood loss - guaiac stools - check anemia panel - will need to consider transfusion to keep hemoglobin greater than 8.0 if patient proven to have coronary artery disease  HTN Well-controlled at present time with borderline hypotension  DM Reasonably well controlled at present - no change in treatment plan today  Code Status: FULL Family Communication: No family present Disposition Plan: Step down unit  Consultants: None  Procedures: None  Antibiotics: None  DVT prophylaxis: Lovenox  HPI/Subjective: The patient is alert and conversant.  She complains of intermittent left-sided chest pain but describes as more of a pleuritic sensation which is positional and cough related.  She denies severe abdominal pain nausea or vomiting.  She feels that her shortness of breath is slowly improving.   Objective: Blood pressure 92/53, pulse 57, temperature 98.6 F (37 C), temperature source Axillary, resp. rate 13, height 5\' 3"  (1.6 m), weight 62 kg (136 lb 11 oz), SpO2 98.00%.  Intake/Output Summary (Last 24 hours) at 11/07/12 1356 Last data filed at 11/07/12 1338  Gross per 24 hour  Intake    742 ml  Output   2675 ml  Net  -1933 ml     Exam: General: No acute respiratory distress at rest but still requiring Venturi mask for hypoxia Lungs: Distant breath sounds throughout all fields with mild fine crackles no wheeze Cardiovascular: Regular rate and rhythm with prominent S2 appreciable Abdomen: Nontender, nondistended, soft, bowel sounds positive, no rebound, no ascites, no appreciable mass Extremities: No significant  cyanosis, clubbing, or edema bilateral lower extremities  Data Reviewed: Basic Metabolic Panel:  Lab 0000000 0419 11/06/12 0607 11/06/12 0205  NA 143 -- 141  K 3.7 -- 5.3*  CL 102 -- 106  CO2 29 -- --  GLUCOSE 106* -- 146*  BUN 43* -- 43*  CREATININE 1.70* 1.67* 1.70*  CALCIUM 9.0 -- --  MG -- --  --  PHOS -- -- --   CBC:  Lab 11/07/12 0419 11/06/12 0607 11/06/12 0205 11/06/12 0203  WBC 4.2 4.3 -- 5.3  NEUTROABS -- -- -- 3.8  HGB 7.2* 7.3* 8.5* 7.0*  HCT 22.5* 23.5* 25.0* 22.6*  MCV 78.7 79.7 -- 80.4  PLT 167 172 -- 185   Cardiac Enzymes:  Lab 11/07/12 0419 11/06/12 1750 11/06/12 1216 11/06/12 0607  CKTOTAL -- -- -- --  CKMB -- -- -- --  CKMBINDEX -- -- -- --  TROPONINI 0.71* 0.84* 0.92* 1.10*   BNP (last 3 results)  Basename 11/06/12 0203  PROBNP 6428.0*   CBG:  Lab 11/07/12 1151 11/06/12 2127 11/06/12 1607 11/06/12 1130 11/06/12 0740  GLUCAP 140* 121* 148* 160* 157*    Recent Results (from the past 240 hour(s))  MRSA PCR SCREENING     Status: Normal   Collection Time   11/06/12  8:04 AM      Component Value Range Status Comment   MRSA by PCR NEGATIVE  NEGATIVE Final      Studies:  Recent x-ray studies have been reviewed in detail by the Attending Physician  Scheduled Meds:  Reviewed in detail by the Attending Physician   Cherene Altes, MD [facility] Office  [phone] Pager [phone]  On-Call/Text Page:      [email or web address]      password [email or web address]  If 7PM-7AM, please contact night-coverage [email or web address] Password [email or web address] 11/07/2012, 1:56 PM   LOS: 1 day

## 2012-11-08 ENCOUNTER — Inpatient Hospital Stay (HOSPITAL_COMMUNITY): Payer: Medicare Other

## 2012-11-08 ENCOUNTER — Encounter (HOSPITAL_COMMUNITY): Payer: Self-pay | Admitting: Internal Medicine

## 2012-11-08 DIAGNOSIS — I272 Pulmonary hypertension, unspecified: Secondary | ICD-10-CM

## 2012-11-08 DIAGNOSIS — I5021 Acute systolic (congestive) heart failure: Secondary | ICD-10-CM

## 2012-11-08 DIAGNOSIS — I509 Heart failure, unspecified: Secondary | ICD-10-CM

## 2012-11-08 DIAGNOSIS — I340 Nonrheumatic mitral (valve) insufficiency: Secondary | ICD-10-CM | POA: Diagnosis present

## 2012-11-08 DIAGNOSIS — N289 Disorder of kidney and ureter, unspecified: Secondary | ICD-10-CM

## 2012-11-08 DIAGNOSIS — I309 Acute pericarditis, unspecified: Secondary | ICD-10-CM | POA: Diagnosis present

## 2012-11-08 DIAGNOSIS — I5042 Chronic combined systolic (congestive) and diastolic (congestive) heart failure: Secondary | ICD-10-CM | POA: Diagnosis present

## 2012-11-08 DIAGNOSIS — R079 Chest pain, unspecified: Secondary | ICD-10-CM

## 2012-11-08 DIAGNOSIS — I5031 Acute diastolic (congestive) heart failure: Secondary | ICD-10-CM | POA: Diagnosis present

## 2012-11-08 HISTORY — DX: Nonrheumatic mitral (valve) insufficiency: I34.0

## 2012-11-08 HISTORY — DX: Pulmonary hypertension, unspecified: I27.20

## 2012-11-08 HISTORY — DX: Chronic combined systolic (congestive) and diastolic (congestive) heart failure: I50.42

## 2012-11-08 LAB — GLUCOSE, CAPILLARY
Glucose-Capillary: 108 mg/dL — ABNORMAL HIGH (ref 70–99)
Glucose-Capillary: 79 mg/dL (ref 70–99)
Glucose-Capillary: 161 mg/dL — ABNORMAL HIGH (ref 70–99)

## 2012-11-08 LAB — IRON AND TIBC
Iron: 31 ug/dL — ABNORMAL LOW (ref 42–135)
Saturation Ratios: 10 % — ABNORMAL LOW (ref 20–55)
UIBC: 290 ug/dL (ref 125–400)

## 2012-11-08 LAB — FOLATE: Folate: >20 ng/mL

## 2012-11-08 LAB — BASIC METABOLIC PANEL
CO2: 37 mEq/L — ABNORMAL HIGH (ref 19–32)
Calcium: 9.4 mg/dL (ref 8.4–10.5)
Glucose, Bld: 103 mg/dL — ABNORMAL HIGH (ref 70–99)
Sodium: 143 mEq/L (ref 135–145)

## 2012-11-08 LAB — PREPARE RBC (CROSSMATCH)

## 2012-11-08 LAB — RETICULOCYTES: RBC.: 3.35 MIL/uL — ABNORMAL LOW (ref 3.87–5.11)

## 2012-11-08 MED ORDER — ENOXAPARIN SODIUM 30 MG/0.3ML ~~LOC~~ SOLN
30.0000 mg | SUBCUTANEOUS | Status: DC
  Filled 2012-11-08: qty 0.3

## 2012-11-08 MED ORDER — HYDRALAZINE HCL 10 MG PO TABS
10.0000 mg | ORAL_TABLET | Freq: Three times a day (TID) | ORAL | Status: DC
  Administered 2012-11-08 – 2012-11-09 (×3): 10 mg via ORAL
  Filled 2012-11-08 (×6): qty 1

## 2012-11-08 MED ORDER — ATORVASTATIN CALCIUM 40 MG PO TABS
40.0000 mg | ORAL_TABLET | Freq: Every day | ORAL | Status: DC
  Administered 2012-11-08 – 2012-11-12 (×5): 40 mg via ORAL
  Filled 2012-11-08 (×5): qty 1

## 2012-11-08 MED ORDER — POTASSIUM CHLORIDE CRYS ER 20 MEQ PO TBCR
40.0000 meq | EXTENDED_RELEASE_TABLET | Freq: Once | ORAL | Status: AC
  Administered 2012-11-08: 40 meq via ORAL
  Filled 2012-11-08: qty 2

## 2012-11-08 MED ORDER — CYANOCOBALAMIN 1000 MCG/ML IJ SOLN
1000.0000 ug | Freq: Every day | INTRAMUSCULAR | Status: DC
  Administered 2012-11-08 – 2012-11-12 (×5): 1000 ug via SUBCUTANEOUS
  Filled 2012-11-08 (×5): qty 1

## 2012-11-08 MED ORDER — POTASSIUM CHLORIDE CRYS ER 20 MEQ PO TBCR
20.0000 meq | EXTENDED_RELEASE_TABLET | Freq: Two times a day (BID) | ORAL | Status: DC
  Administered 2012-11-08 – 2012-11-11 (×6): 20 meq via ORAL
  Filled 2012-11-08 (×7): qty 1

## 2012-11-08 MED ORDER — CARVEDILOL 6.25 MG PO TABS
6.2500 mg | ORAL_TABLET | Freq: Two times a day (BID) | ORAL | Status: DC
  Administered 2012-11-09 – 2012-11-11 (×3): 6.25 mg via ORAL
  Filled 2012-11-08 (×7): qty 1

## 2012-11-08 MED ORDER — ISOSORBIDE MONONITRATE ER 30 MG PO TB24
30.0000 mg | ORAL_TABLET | Freq: Every day | ORAL | Status: DC
  Administered 2012-11-08 – 2012-11-12 (×5): 30 mg via ORAL
  Filled 2012-11-08 (×5): qty 1

## 2012-11-08 NOTE — Progress Notes (Signed)
Report called to [facility] 4700 RN.

## 2012-11-08 NOTE — Progress Notes (Addendum)
1500 Transferred from 2600 per wc alert oriented Placed on tele NSR 98.1 57 20 95/46. 1730 labs being drawn for type and screen to receive 1 unit prbc's when ready.

## 2012-11-08 NOTE — Progress Notes (Signed)
Pt transferred to 4710 via wheelchair. No IV, O2 at 2L. Meds in chart. VS stable upon transfer. Family updated on new room number. No current question or complaints at this time. Mandy Larson

## 2012-11-08 NOTE — Clinical Documentation Improvement (Signed)
CHF DOCUMENTATION CLARIFICATION QUERY  THIS DOCUMENT IS NOT A PERMANENT PART OF THE MEDICAL RECORD  TO RESPOND TO THE THIS QUERY, FOLLOW THE INSTRUCTIONS BELOW:  1. If needed, update documentation for the patient's encounter via the notes activity.  2. Access this query again and click edit on the [facility] [employer].  3. After updating, or not, click F2 to complete all highlighted (required) fields concerning your review. Select "additional documentation in the medical record" OR "no additional documentation provided".  4. Click Sign note button.  5. The deficiency will fall out of your In Basket *Please let us know if you are not able to complete this workflow by phone or e-mail (listed below).  Please update your documentation within the medical record to reflect your response to this query.                                                                                     11/08/12  Dear Dr. [facility],  In a better effort to capture your patient's severity of illness, reflect appropriate length of stay and utilization of resources, a review of the patient medical record has revealed the following indicators the diagnosis of Heart Failure.    Based on your clinical judgment, please clarify and document in a progress note and/or discharge summary the clinical condition associated with the following supporting information:  In responding to this query please exercise your independent judgment.  The fact that a query is asked, does not imply that any particular answer is desired or expected.  Possible Clinical Conditions?  Chronic Systolic Congestive Heart Failure Chronic Diastolic Congestive Heart Failure Chronic Systolic & Diastolic Congestive Heart Failure Acute Systolic Congestive Heart Failure Acute Diastolic Congestive Heart Failure Acute Systolic & Diastolic Congestive Heart Failure Acute on Chronic Systolic Congestive Heart Failure Acute on Chronic Diastolic  Congestive Heart Failure Acute on Chronic Systolic & Diastolic  Congestive Heart Failure Other Condition________________________________________ Cannot Clinically Determine  Supporting Information:  Risk Factors: (As per notes) "Pt has CHF" & "Acute respiratory failure"  Signs & Symptoms:(As per notes) "Troponin elevation Mild - is likely due to CHF exacerbation but actual NSTEMI is certainly a possibility - risk stratification for coronary artery disease will likely be indicated if systolic congestive heart failure noted on echocardiogram - empiric medical therapy to continue" "In the ED they gave her 40 of lasix twice with only minimal to moderate urine output. Her breathing did improve significantly on the BIPAP, but when they tried to wean it off she desaturated back down into the 80s on a NRB."     Diagnostics:11-08-12  BNP=7944.0 (H)   Treatment: MEDS:  furosemide (LASIX) injection 40 mg [id number]     Ordered Dose: 40 mg  Route: Intravenous  Frequency: Every 12 hours      Reviewed: additional documentation in the medical record  Thank You,  Wallie Char Delk RN, BSN, CCDS Clinical Documentation Specialist: [phone] [place]

## 2012-11-08 NOTE — Progress Notes (Signed)
[facility] Progress Note Mandy Larson   Mandy Larson [id number] DOB: Apr 04, 1930 DOA: 11/06/2012 PCP: No primary provider on file.  Brief narrative: 78 y.o. female who presented with complaint of shortness of breath (SOB) onset over the past month. Her SOB was worse with exertion and better at rest, associated with L sided chest pain that was worse with exertion and better at rest. SOB also worse while lying down and better sitting up. Despite her family urging her for the past month to see a doctor she resisted until they called EMS who found patient in her house in the bathroom struggling to breath. Her initial O2 sat was 70% so they initated CPAP which brought her O2 to 99%. They gave her 1 sublingual nitro.  In the ED they gave her 40 of lasix twice with only minimal to moderate urine output. Her breathing did improve significantly on the BIPAP, but when they tried to wean it off she desaturated back down into the 80s on a NRB.   Assessment/Plan:  Pulmonary edema DUE TO: A) acute systolic heart failure EF 35-40% B) grade 2 diastolic dysfunction with acute exacerbation Echocardiogram this admission reveals new finding of reduced systolic function in setting of inferior and septal wall motion abnormalities. Cardiology is concerned this may be ischemic in etiology. With renal insufficiency not an appropriate candidate for cardiac catheterization at this time but Myoview planned. In addition she has grade 2 diastolic dysfunction, severe mitral regurgitation and moderate pulmonary hypertension. She also has a very small free-flowing pericardial effusion without features of cardiac tamponade  Acute hypoxic resp failure Due to above - stable on low oxygen Ventimask and likely can transition to nasal cannula today noting patient was sleeping soundly on her back when we entered the room for evaluation and she was not having any respiratory symptoms  Troponin  elevation Mild - is likely due to CHF exacerbation but NSTEMI is certainly a possibility - risk stratification for coronary artery disease planned - empiric medical therapy to continue  Chest pain Resolved at this time - troponin only mildly elevated - EKG without acute ST segment changes - see discussion above concerning risk stratification  Renal insufficiency Creatinine is stable in face of diuresis - continue to follow  Hypokalemia Likely secondary to ongoing use of IV Lasix - oral replete and follow electrolytes  Normocytic anemia No clear evidence of blood loss - guaiac stools - check anemia panel - will need to consider transfusion to keep hemoglobin greater than 8.0 if patient proven to have coronary artery disease  HTN Well-controlled at present time with borderline hypotension  DM Reasonably well controlled at present - no change in treatment plan today  Code Status: FULL Family Communication: No family present Disposition Plan: Transfer to telemetry  Consultants: Cardiology - [place]   Antibiotics: None  DVT prophylaxis: Lovenox  HPI/Subjective: The patient is alert and conversant. States feels much better and is currently denying any chest pain or shortness of breath.   Objective: Blood pressure 109/60, pulse 60, temperature 97.8 F (36.6 C), temperature source Axillary, resp. rate 19, height 5\' 3"  (1.6 m), weight 62 kg (136 lb 11 oz), SpO2 100.00%.  Intake/Output Summary (Last 24 hours) at 11/08/12 1237 Last data filed at 11/08/12 1100  Gross per 24 hour  Intake    363 ml  Output   2975 ml  Net  -2612 ml     Exam: General: No acute respiratory distress at rest  Lungs: Distant breath sounds throughout all fields -previous fine crackles at bases have resolved -no wheeze-30% Ventimask with saturations 100% Cardiovascular: Regular rate and rhythm with prominent S2 appreciable, no peripheral edema, no JVD Abdomen: Nontender, nondistended, soft, bowel  sounds positive, no rebound, no ascites, no appreciable mass Musculoskeletal: No significant cyanosis, clubbing of bilateral lower extremities Neurological: Alert and oriented x3, moves all extremities x4, cranial nerves II through XII grossly intact, exam nonfocal  Data Reviewed: Basic Metabolic Panel:  Lab [id number] 0540 11/07/12 0419 11/06/12 0607 11/06/12 0205  NA 143 143 -- 141  K 3.1* 3.7 -- 5.3*  CL 96 102 -- 106  CO2 37* 29 -- --  GLUCOSE 103* 106* -- 146*  BUN 43* 43* -- 43*  CREATININE 1.66* 1.70* 1.67* 1.70*  CALCIUM 9.4 9.0 -- --  MG -- -- -- --  PHOS -- -- -- --   CBC:  Lab 11/07/12 0419 11/06/12 0607 11/06/12 0205 11/06/12 0203  WBC 4.2 4.3 -- 5.3  NEUTROABS -- -- -- 3.8  HGB 7.2* 7.3* 8.5* 7.0*  HCT 22.5* 23.5* 25.0* 22.6*  MCV 78.7 79.7 -- 80.4  PLT 167 172 -- 185   Cardiac Enzymes:  Lab 11/07/12 0419 11/06/12 1750 11/06/12 1216 11/06/12 0607  CKTOTAL -- -- -- --  CKMB -- -- -- --  CKMBINDEX -- -- -- --  TROPONINI 0.71* 0.84* 0.92* 1.10*   BNP (last 3 results)  Basename 11/08/12 0540 11/06/12 0203  PROBNP 7944.0* 6428.0*   CBG:  Lab 11/08/12 1230 11/08/12 0739 11/07/12 2119 11/07/12 1151 11/06/12 2127  GLUCAP 296* 108* 154* 140* 121*    Recent Results (from the past 240 hour(s))  MRSA PCR SCREENING     Status: Normal   Collection Time   11/06/12  8:04 AM      Component Value Range Status Comment   MRSA by PCR NEGATIVE  NEGATIVE Final      Studies:  Recent x-ray studies have been reviewed in detail by the Attending Physician  Scheduled Meds:  Reviewed in detail by the Attending Physician   Erin Hearing, ANP [facility] Office  [phone] Pager [phone]  On-Call/Text Page:      [email or web address]      password [email or web address]  If 7PM-7AM, please contact night-coverage [email or web address] Password [email or web address] 11/08/2012, 12:37 PM   LOS: 2 days   I have personally examined this patient and reviewed the entire database. I have reviewed the above  note, made any necessary editorial changes, and agree with its content.  Cherene Altes, MD [facility]

## 2012-11-08 NOTE — Consult Note (Signed)
CARDIOLOGY CONSULT NOTE  Patient ID: Mandy Larson MRN: [id number] DOB/AGE: 1929-12-13 78 y.o.  Admit date: 11/06/2012 Primary Physician: [name] Primary Cardiologist: New Reason for Consultation: CHF  HPI: 78 yo with history of HTN, DM2, and CKD presented on 12/28 with dyspnea and chest pain, she was found to be in acute pulmonary edema.  Cardiac enzymes were mildly elevated.  Patient reports that for about a month, she has had dyspnea + chest tightness with minimal exertion.  She has been taking care of her ill husband so had not mentioned this to family.  On 12/28, she developed dyspnea at rest as well as chest tightness so was brought to the ER.  Oxygen saturation was 70% and she was briefly on CPAP.  She had pulmonary edema on CXR.  She was diuresed with IV Lasix and has been doing better.  Now she is on nasal cannula.  She is not short of breath at rest and has not had chest pain since admission day.  Troponin peaked at 1.10 on the day of admission.  She has had stably elevated creatinine since admission.  I do not know her baseline.  She also has had stable normocytic anemia since admission, I do not know her baseline for this either. She apparently has been anemic in the past. Echo showed EF 35-40% and was suggestive of an inferior MI.   Review of systems complete and found to be negative unless listed above in HPI  Past Medical History: 1. HTN 2. DM2 3. Hyperlipidemia 4. CKD 5. Cardiomyopathy: Presumed ischemic.  Admitted 11/06/12 with acute pulmonary edema and chest pain.  Echo (12/13) showed EF 35-40% with inferior and inferoseptal akinesis, severe mitral regurgitation, grade II diastolic dysfunction, mildly dilated RV with normal systolic function, PA systolic pressure 50 mmHg.  6. CAD: ACS at admission 11/06/12.  7. Mitral regurgitation: Severe on 12/13 echo, suspect infarct-related MR.  8. Anemia  FH: No premature CAD that she knows of.  History   Social History    . Marital Status: Married    Spouse Name: N/A    Number of Children: N/A  . Years of Education: N/A   Occupational History  . Not on file.   Social History Main Topics  . Smoking status: Former Smoker    Types: Cigarettes    Quit date: 11/09/1999  . Smokeless tobacco: Not on file  . Alcohol Use: No  . Drug Use: No  . Sexually Active: No   Other Topics Concern  . Not on file   Social History Narrative  . No narrative on file       . aspirin  81 mg Oral Daily  . atorvastatin  40 mg Oral q1800  . carvedilol  6.25 mg Oral BID WC  . enoxaparin (LOVENOX) injection  1 mg/kg Subcutaneous Q24H  . furosemide  40 mg Intravenous Q12H  . gabapentin  300 mg Oral TID  . hydrALAZINE  10 mg Oral Q8H  . insulin aspart  0-9 Units Subcutaneous TID WC  . isosorbide mononitrate  30 mg Oral Daily  . levothyroxine  50 mcg Oral QAC breakfast  . potassium chloride  20 mEq Oral BID  . sodium chloride  3 mL Intravenous Q12H     Physical exam Blood pressure 109/60, pulse 60, temperature 97.8 F (36.6 C), temperature source Axillary, resp. rate 19, height 5\' 3"  (1.6 m), weight 136 lb 11 oz (62 kg), SpO2 100.00%. General: NAD Neck: No JVD, no thyromegaly  or thyroid nodule.  Lungs: Clear to auscultation bilaterally with normal respiratory effort. CV: Lateral PMI.  Heart regular S1/S2, no S3/S4, 2/6 HSM at apex.  No peripheral edema.  No carotid bruit.   Abdomen: Soft, nontender, no hepatosplenomegaly, no distention.  Skin: Intact without lesions or rashes.  Neurologic: Alert and oriented x 3.  Psych: Normal affect. Extremities: No clubbing or cyanosis.  HEENT: Normal.   Labs:   Lab Results  Component Value Date   WBC 4.2 11/07/2012   HGB 7.2* 11/07/2012   HCT 22.5* 11/07/2012   MCV 78.7 11/07/2012   PLT 167 11/07/2012    Lab 11/08/12 0540  NA 143  K 3.1*  CL 96  CO2 37*  BUN 43*  CREATININE 1.66*  CALCIUM 9.4  PROT --  BILITOT --  ALKPHOS --  ALT --  AST --  GLUCOSE  103*   Lab Results  Component Value Date   TROPONINI 0.71* 11/07/2012     Radiology: - CXR (12/29): Improved edema.  EKG: - NSR, RBBB, LAFB  ASSESSMENT AND PLAN:  78 yo with DM, CKD, and HTN presented with acute pulmonary edema and chest pain.  Cardiac enzymes mildly elevated and creatinine high.  Also with significant anemia.  1. CHF: Acute systolic CHF with pulmonary edema at admission.  Suspect ischemic CMP.  She has been diuresed with IV Lasix and is feeling better.  JVP not elevated today and no peripheral edema.  EF 35-40% with evidence for inferior MI as well as severe MR.  - Continue IV Lasix today, probably transition to po tomorrow.  - Stop atenolol (especially with abnormal renal function).  Start her on Coreg beginning tomorrow.  - Stop doxazosin.  Will use hydralazine 10 mg tid and Imdur 30 mg daily.  Hold off on ACEI for now with GFR around 30.  2. CAD: Patient has a likely ischemic CMP.  Presentation was more consistent with CHF exacerbation than with ACS.  Troponin was elevated to 1.1, but she apparently has had an inferior MI in the past based on echo.  She is not having chest pain now and troponin has been trending down from 1.1.  She has GFR around 30 at this time.  Would hold off on cath for now with CKD and severe anemia but arrange for Myoview prior to discharge to assess for ischemia.  I suspect that we will see an inferior infarction.  No cath unless post-discharge myoview shows a large area of ischemia.  Continue ASA 81, change statin to atorvastatin 40 mg daily.  3. Renal: Stably elevated creatinine with GFR around 30.  ? Baseline.  As above, holding off on cath for now unless pre-discharge myoview shows significant ischemia.  4. Anemia: Needs full workup.  Stable.  No FOBT yet.  Would aim for hgb >/= 8 with recent ACS.  Would transfuse 1 unit slowly around the time she gets her pm Lasix.   5. Mitral regurgitation: Severe, likely infarction-related MR (inferior MI).   Afterload reduction with hydralazine/nitrates.    Signed: Loralie Champagne 11/08/2012, 1:52 PM

## 2012-11-09 ENCOUNTER — Encounter (HOSPITAL_COMMUNITY): Payer: Self-pay | Admitting: *Deleted

## 2012-11-09 DIAGNOSIS — I059 Rheumatic mitral valve disease, unspecified: Secondary | ICD-10-CM

## 2012-11-09 LAB — TROPONIN I
Troponin I: <0.3 ng/mL (ref <0.30)
Troponin I: 0.36 ng/mL (ref <0.30)

## 2012-11-09 LAB — CK TOTAL AND CKMB (NOT AT ARMC)
CK, MB: 1.6 ng/mL (ref 0.3–4.0)
Total CK: 51 U/L (ref 7–177)

## 2012-11-09 LAB — CBC
Hemoglobin: 8.9 g/dL — ABNORMAL LOW (ref 12.0–15.0)
MCH: 25.4 pg — ABNORMAL LOW (ref 26.0–34.0)
MCHC: 31.9 g/dL (ref 30.0–36.0)
RDW: 15.9 % — ABNORMAL HIGH (ref 11.5–15.5)

## 2012-11-09 LAB — GLUCOSE, CAPILLARY
Glucose-Capillary: 91 mg/dL (ref 70–99)
Glucose-Capillary: 264 mg/dL — ABNORMAL HIGH (ref 70–99)

## 2012-11-09 LAB — TYPE AND SCREEN

## 2012-11-09 LAB — BASIC METABOLIC PANEL
BUN: 49 mg/dL — ABNORMAL HIGH (ref 6–23)
Creatinine, Ser: 1.81 mg/dL — ABNORMAL HIGH (ref 0.50–1.10)
GFR calc Af Amer: 29 mL/min — ABNORMAL LOW (ref >90)
GFR calc non Af Amer: 25 mL/min — ABNORMAL LOW (ref >90)
Glucose, Bld: 91 mg/dL (ref 70–99)

## 2012-11-09 MED ORDER — SODIUM CHLORIDE 0.9 % IV BOLUS (SEPSIS): 250.0000 mL | Freq: Once | INTRAVENOUS | Status: AC

## 2012-11-09 MED ORDER — ASPIRIN 81 MG PO CHEW
324.0000 mg | CHEWABLE_TABLET | Freq: Every day | ORAL | Status: DC
  Administered 2012-11-09 – 2012-11-11 (×3): 324 mg via ORAL
  Filled 2012-11-09 (×2): qty 4

## 2012-11-09 MED ORDER — NITROGLYCERIN 0.4 MG SL SUBL
SUBLINGUAL_TABLET | SUBLINGUAL | Status: AC
  Filled 2012-11-09: qty 25

## 2012-11-09 MED ORDER — HEPARIN (PORCINE) IN NACL 100-0.45 UNIT/ML-% IJ SOLN
800.0000 [IU]/h | INTRAMUSCULAR | Status: DC
  Administered 2012-11-09: 850 [IU]/h via INTRAVENOUS
  Administered 2012-11-10: 800 [IU]/h via INTRAVENOUS
  Filled 2012-11-09 (×2): qty 250

## 2012-11-09 MED ORDER — SODIUM CHLORIDE 0.9 % IV BOLUS (SEPSIS)
250.0000 mL | Freq: Once | INTRAVENOUS | Status: AC
  Administered 2012-11-09: 250 mL via INTRAVENOUS

## 2012-11-09 NOTE — Progress Notes (Signed)
ANTICOAGULATION CONSULT NOTE - Initial Consult  Pharmacy Consult for Heparin Indication: chest pain/ACS  No Known Allergies  Patient Measurements: Height: 5\' 3"  (160 cm) Weight: 130 lb 3.2 oz (59.058 kg) (a) IBW/kg (Calculated) : 52.4   Labs:  Basename 11/09/12 0530 11/08/12 0540 11/07/12 0419 11/06/12 1750  HGB 8.9* -- 7.2* --  HCT 27.9* -- 22.5* --  PLT 198 -- 167 --  APTT -- -- -- --  LABPROT -- -- -- --  INR -- -- -- --  HEPARINUNFRC -- -- -- --  CREATININE 1.81* 1.66* 1.70* --  CKTOTAL -- -- -- --  CKMB -- -- -- --  TROPONINI -- -- 0.71* 0.84*    Estimated Creatinine Clearance: 20.5 ml/min (by C-G formula based on Cr of 1.81).   Medical History: Past Medical History  Diagnosis Date  . Diabetes mellitus without complication   . Anginal pain   . Hypertension   . Shortness of breath   . Anemia   . Acute systolic congestive heart failure, NYHA class 2 11/08/2012   Assessment: Restarting full dose anti-coagulation for chest pain.  Last dose of treatment Lovenox 12/30 at 5:30 pm  Goal of Therapy:  Heparin level 0.3-0.7 units/ml Monitor platelets by anticoagulation protocol: Yes   Plan:  1) No heparin bolus 2) Begin heparin drip at 850 units / hr 3) Heparin level 8 hours after heparin begins 4) Daily heparin level, CBC  Thank you. Anette Guarneri, PharmD [phone]  11/09/2012,12:59 PM

## 2012-11-09 NOTE — Progress Notes (Signed)
Pt transferred to room 2917. Report given to receiving nurse.

## 2012-11-09 NOTE — Progress Notes (Signed)
Advanced Heart Failure Rounding Note   Subjective:   Mandy Larson is an 78 yo with history of HTN, DM2, and CKD presented on 12/28 with dyspnea and chest pain, she was found to be in acute pulmonary edema. Cardiac enzymes were mildly elevated.   Patient reports that for about a month, she has had dyspnea + chest tightness with minimal exertion. She has been taking care of her ill husband so had not mentioned this to family. On 12/28, she developed dyspnea at rest as well as chest tightness so was brought to the ER. Oxygen saturation was 70% and she was briefly on CPAP. She had pulmonary edema on CXR. Initially diuresed with IV lasix. Troponin peaked at 1.10 on the day of admission.   12 -29-13 Echo showed EF 35-40% and was suggestive of an inferior MI.   Yesterday she was started on hydralazine 10 mg tid and IMDUR 30 mg daily. Overall weight down 11 pounds.   Complaining of constant chest pain and heaviness.  Pain 6/10.         Objective:   Weight Range:  Vital Signs:   Temp:  [98 F (36.7 C)-98.9 F (37.2 C)] 98.1 F (36.7 C) (12/31 0515) Pulse Rate:  [50-60] 50  (12/31 1157) Resp:  [16-22] 16  (12/31 0515) BP: (78-111)/(46-54) 102/54 mmHg (12/31 1321) SpO2:  [97 %-100 %] 98 % (12/31 1157) Weight:  [130 lb 3.2 oz (59.058 kg)-130 lb 11.7 oz (59.3 kg)] 130 lb 3.2 oz (59.058 kg) (12/31 0515) Last BM Date: 11/08/12  Weight change: Filed Weights   11/08/12 0021 11/08/12 1507 11/09/12 0515  Weight: 136 lb 11 oz (62 kg) 130 lb 11.7 oz (59.3 kg) 130 lb 3.2 oz (59.058 kg)    Intake/Output:   Intake/Output Summary (Last 24 hours) at 11/09/12 1330 Last data filed at 11/09/12 0900  Gross per 24 hour  Intake    600 ml  Output   1551 ml  Net   -951 ml     Physical Exam: General:  Chronically ill  No resp difficulty HEENT: normal Neck: supple. JVP flat. Carotids 2+ bilat; no bruits. No lymphadenopathy or thryomegaly appreciated. Cor: PMI nondisplaced. Regular rate & rhythm. No  rubs, gallops or murmurs. Lungs: clear 2 liters [place] Abdomen: soft, nontender, nondistended. No hepatosplenomegaly. No bruits or masses. Good bowel sounds. Extremities: no cyanosis, clubbing, rash, edema Neuro: alert & orientedx3, cranial nerves grossly intact. moves all 4 extremities w/o difficulty. Affect pleasant  Telemetry: Sinus Rhythm   Labs: Basic Metabolic Panel:  Lab [id number] 0530 11/08/12 0540 11/07/12 0419 11/06/12 0607 11/06/12 0205  NA 141 143 143 -- 141  K 3.7 3.1* 3.7 -- 5.3*  CL 95* 96 102 -- 106  CO2 37* 37* 29 -- --  GLUCOSE 91 103* 106* -- 146*  BUN 49* 43* 43* -- 43*  CREATININE 1.81* 1.66* 1.70* 1.67* 1.70*  CALCIUM 9.4 9.4 9.0 -- --  MG -- -- -- -- --  PHOS -- -- -- -- --    Liver Function Tests: No results found for this basename: AST:5,ALT:5,ALKPHOS:5,BILITOT:5,PROT:5,ALBUMIN:5 in the last 168 hours No results found for this basename: LIPASE:5,AMYLASE:5 in the last 168 hours No results found for this basename: AMMONIA:3 in the last 168 hours  CBC:  Lab 11/09/12 0530 11/07/12 0419 11/06/12 0607 11/06/12 0205 11/06/12 0203  WBC 4.7 4.2 4.3 -- 5.3  NEUTROABS -- -- -- -- 3.8  HGB 8.9* 7.2* 7.3* 8.5* 7.0*  HCT 27.9* 22.5* 23.5* 25.0* 22.6*  MCV 79.7 78.7 79.7 -- 80.4  PLT 198 167 172 -- 185    Cardiac Enzymes:  Lab 11/09/12 1211 11/07/12 0419 11/06/12 1750 11/06/12 1216 11/06/12 0607  CKTOTAL 51 -- -- -- --  CKMB 1.6 -- -- -- --  CKMBINDEX -- -- -- -- --  TROPONINI -- 0.71* 0.84* 0.92* 1.10*    BNP: BNP (last 3 results)  Basename 11/08/12 0540 11/06/12 0203  PROBNP 7944.0* 6428.0*     Other results:  EKG:   Imaging: Dg Chest Port 1 View  11/08/2012  *RADIOLOGY REPORT*  Clinical Data: Follow-up edema  PORTABLE CHEST - 1 VIEW  Comparison: 11/06/2012  Findings: Cardiac enlargement is again identified and remains unchanged in degree. Mediastinal contours are unchanged with aortic calcification again noted.  There has been interval  improvement in the interstitial edema pattern noted on the prior exam. There is a right pleural effusion identified which is slightly larger than on the previous exam. The left pleural effusion has resolved.  Improved bibasilar density is noted with persistent alveolar infiltrate seen at the right lung base.  This may represent residual basilar alveolar edema but an area of pneumonia is not excluded. No new areas of infiltrate are seen.  Linear density in the left midlung zone may represent scarring and underlying prominence of the interstitial markings is also likely chronic.  No new bony findings are suggested.  IMPRESSION: Improved interstitial edema pattern with a slightly increased right pleural effusion.  Persistent but improved right basilar density may represent residual alveolar edema with pneumonia not excluded.   Original Report Authenticated By: Ponciano Ort, M.D.      Medications:     Scheduled Medications:    . aspirin  324 mg Oral Daily  . atorvastatin  40 mg Oral q1800  . carvedilol  6.25 mg Oral BID WC  . cyanocobalamin  1,000 mcg Subcutaneous Q1400  . gabapentin  300 mg Oral TID  . insulin aspart  0-9 Units Subcutaneous TID WC  . isosorbide mononitrate  30 mg Oral Daily  . levothyroxine  50 mcg Oral QAC breakfast  . nitroGLYCERIN      . potassium chloride  20 mEq Oral BID    Infusions:    . heparin      PRN Medications: acetaminophen, morphine injection, oxyCODONE, sodium chloride Assessment/Plan 78 yo with DM, CKD, and HTN presented with acute pulmonary edema and chest pain. Cardiac enzymes mildly elevated and creatinine high. Also with significant anemia.   1. A/C systolic heart failure  EF 35-40% with evidence for inferior MI as well as severe MR. Appears euvolemic to dry today. Pulmonary edema resolved.  - Hold lasix. Give 250 cc bolus - Hold hydralazine - Continue Imdur and this evening's Coreg dose if SBP >/= 100.    2. CAD -  Chest pain again today in  setting of SBP in 70s.  Will hold BP active meds and Lasix and given some IV fluid as above.  Repeated ECG in setting of hypotension shows more prominent inferolateral T wave inversions.  Will continue ASA, start heparin gtt.  If hemoglobin stays stable, would treat with Plavix as well.  Will repeat cardiac enzymes, if significant rise and ongoing CP despite improved BP, may need to go to cath lab.  However, as I suspect a prior inferior MI based on echo, lack of marked troponin bump this admission, and low GFR (around 25), would try to manage conservatively.    3. Acute Renal Failure- Creatinine bump  noted . Hold diuretics and give a 250 cc bolus.   4. Anemia - Post transfusion hemoglobin 8.9. Will need full work up, unsure of etiology but does not seem to be actively bleeding.    5. Mitral Regurgitation-Severe, likely infarction-related MR (inferior MI).    Length of Stay: 3   Aalayah Riles Aundra Dubin 11/09/2012, 1:30 PM

## 2012-11-09 NOTE — Progress Notes (Signed)
PROGRESS NOTE  Mandy Larson [id number] DOB: February 17, 1930 DOA: 11/06/2012 PCP: No primary provider on file.  Brief narrative: 78 yr old [name] admitted with SOB + L sided CP-sats 70% initially, improved on Bipap, did not keep sats up on NRB, Nitro didn't ease pain-Troponin peaked at 1.10, latest 0.7, BNP 7944 12.30, Her Echo showed Ef35-40% c ?Inf MI-Creat has climbed to 1.8 c diuresis Hb=7.1 12.29-s/p 1U PRBC 12.30   Past medical history-As per Problem list Chart reviewed as below- None really  Consultants:  Cardiology-Dr. Marigene Ehlers  Procedures:  See below  Antibiotics:  none   Subjective  Having CP but also smiling and eating sandwich No n/v States pain in L chest , non reporducible   Objective    Interim History: nad  Telemetry: Sinus brady, no reds 50-60 rate.  Objective: Filed Vitals:   11/08/12 2129 11/08/12 2207 11/09/12 0515 11/09/12 1157  BP: 105/52 111/52 107/51 78/50  Pulse: 59 52 58 50  Temp: 98 F (36.7 C) 98.9 F (37.2 C) 98.1 F (36.7 C)   TempSrc: Oral Oral Oral   Resp: 17 17 16    Height:      Weight:   59.058 kg (130 lb 3.2 oz)   SpO2: 98% 98% 100% 98%    Intake/Output Summary (Last 24 hours) at 11/09/12 1217 Last data filed at 11/09/12 0900  Gross per 24 hour  Intake    720 ml  Output   1551 ml  Net   -831 ml    Exam:  General: alert asthenic [name]> Cardiovascular: s1 s2 no m Respiratory: clear Abdomen: soft nt/nd Skin no le pitting edema Neurointact  Data Reviewed: Basic Metabolic Panel:  Lab [id number] 0530 11/08/12 0540 11/07/12 0419 11/06/12 0607 11/06/12 0205  NA 141 143 143 -- 141  K 3.7 3.1* -- -- --  CL 95* 96 102 -- 106  CO2 37* 37* 29 -- --  GLUCOSE 91 103* 106* -- 146*  BUN 49* 43* 43* -- 43*  CREATININE 1.81* 1.66* 1.70* 1.67* 1.70*  CALCIUM 9.4 9.4 9.0 -- --  MG -- -- -- -- --  PHOS -- -- -- -- --   Liver Function Tests: No results found for this basename: AST:5,ALT:5,ALKPHOS:5,BILITOT:5,PROT:5,ALBUMIN:5 in  the last 168 hours No results found for this basename: LIPASE:5,AMYLASE:5 in the last 168 hours No results found for this basename: AMMONIA:5 in the last 168 hours CBC:  Lab 11/09/12 0530 11/07/12 0419 11/06/12 0607 11/06/12 0205 11/06/12 0203  WBC 4.7 4.2 4.3 -- 5.3  NEUTROABS -- -- -- -- 3.8  HGB 8.9* 7.2* 7.3* 8.5* 7.0*  HCT 27.9* 22.5* 23.5* 25.0* 22.6*  MCV 79.7 78.7 79.7 -- 80.4  PLT 198 167 172 -- 185   Cardiac Enzymes:  Lab 11/07/12 0419 11/06/12 1750 11/06/12 1216 11/06/12 0607  CKTOTAL -- -- -- --  CKMB -- -- -- --  CKMBINDEX -- -- -- --  TROPONINI 0.71* 0.84* 0.92* 1.10*   BNP: No components found with this basename: POCBNP:5 CBG:  Lab 11/09/12 1127 11/09/12 0639 11/08/12 2140 11/08/12 1609 11/08/12 1230  GLUCAP 190* 102* 161* 79 296*    Recent Results (from the past 240 hour(s))  MRSA PCR SCREENING     Status: Normal   Collection Time   11/06/12  8:04 AM      Component Value Range Status Comment   MRSA by PCR NEGATIVE  NEGATIVE Final      Studies:  All Imaging reviewed and is as per above notation   Scheduled Meds:   . aspirin  81 mg Oral Daily  . atorvastatin  40 mg Oral q1800  . carvedilol  6.25 mg Oral BID WC  . cyanocobalamin  1,000 mcg Subcutaneous Q1400  . enoxaparin (LOVENOX) injection  30 mg Subcutaneous Q24H  . gabapentin  300 mg Oral TID  . insulin aspart  0-9 Units Subcutaneous TID WC  . isosorbide mononitrate  30 mg Oral Daily  . levothyroxine  50 mcg Oral QAC breakfast  . nitroGLYCERIN      . potassium chloride  20 mEq Oral BID  . sodium chloride  250 mL Intravenous Once   Continuous Infusions:    Assessment/Plan:   1. Pulm edema-secondary to acute systolic heart failure, severe mitral regurg-almost none mouth he noted on echo, poor catheterization candidate, unclear if would benefit from CVTS input as significant mitral regurgitation-currently hydralazine on hold Lasix on hold 2. NSTEMI, Inf MI?- EKg shows sinus  bradycardia PR 0.08 QRS axis left anterior fascicular block at -80, incomplete right bundle branch T wave inversions in contiguous V4 through V6 meets as well as T wave inversions in lead 2 and 3- still experiencing the same-2/2 to new infarct ? Inferior + Ant Apical event??vs exacerbation by iatrogenic hypotension  [ccd/diuretic/nitrate]-patient will be transferred back to step down unit. She will be heparinized.  Patient should be started on Plavix. Further work-up per Cardiology with known risks eg [AKI etc]-Given ASA 325 po stat 3. Hypotension-probably secondary to above-poor candidate for pressors as this would increase pain. 250 cc bolus ordered for hypotension by cardiologist-further expectant management per them. 4. Renal insufficiency Creatinine worsened secondary to diuresis. See above. This precludes further if she make workup. 5. Hypokalemia resolved 6. Microcytic anemia, MCV 79, Iron 31-AoCD vs ACD-transfused x 1 12/29. Keep Hemoglobin above 8-8.5, given ACS 7. DM-tight control given ? ACs.    Code Status: none Family Communication: briefly explained to husband daughter and grand-daughter in room potential Cardiac issues.  Mentioned will likely need further diagnostic tests and likely transfer back to SDU for monitoring Disposition Plan: Inpatient-transfer back to SDU  Addend-patient seen again-family in room.  States CP better, less winded and eating chips in NAD We will still transfer patient back to SDU and reassess   Verneita Griffes, MD  [facility] Pager [phone] 11/09/2012, 12:17 PM    LOS: 3 days

## 2012-11-09 NOTE — Progress Notes (Signed)
Pt c/o chest pain 6/10, BP 78/50, HR 50.  Notified PA and MD, orders given to give 250 bolus, stat cardiac enzymes, and EKG.  Will carry out MD orders and continue tom monitor.

## 2012-11-09 NOTE — Progress Notes (Signed)
ANTICOAGULATION CONSULT NOTE - Follow Up Consult  Pharmacy Consult for Heparin Indication: chest pain/ACS  No Known Allergies  Patient Measurements: Height: 5\' 3"  (160 cm) Weight: 130 lb 3.2 oz (59.058 kg) (a) IBW/kg (Calculated) : 52.4  Heparin Dosing Weight: 59kg  Vital Signs: Temp: 98.6 F (37 C) (12/31 1936) Temp src: Oral (12/31 1936) BP: 106/55 mmHg (12/31 1936) Pulse Rate: 58  (12/31 1936)  Labs:  Basename 11/09/12 2054 11/09/12 1756 11/09/12 1324 11/09/12 1211 11/09/12 0530 11/08/12 0540 11/07/12 0419  HGB -- -- -- -- 8.9* -- 7.2*  HCT -- -- -- -- 27.9* -- 22.5*  PLT -- -- -- -- 198 -- 167  APTT -- -- -- -- -- -- --  LABPROT -- -- -- -- -- -- --  INR -- -- -- -- -- -- --  HEPARINUNFRC 0.73* -- -- -- -- -- --  CREATININE -- -- -- -- 1.81* 1.66* 1.70*  CKTOTAL -- -- -- 51 -- -- --  CKMB -- -- -- 1.6 -- -- --  TROPONINI -- 0.36* <0.30 -- -- -- 0.71*    Estimated Creatinine Clearance: 20.5 ml/min (by C-G formula based on Cr of 1.81).   Medications:  Heparin 850 units/hr   Assessment: 82yof on heparin for CP/ACS. Heparin level (0.73) is just above goal level. Patient had been on full-dose Lovenox previously (last dose 12/30 PM) which may be contributing to elevated heparin level especially with patient's reduced renal function (CrCl 75ml/min) - will reduce rate slightly and follow-up AM heparin level. - H/H and Plts improving - No significant bleeding reported per RN  Goal of Therapy:  Heparin level 0.3-0.7 units/ml Monitor platelets by anticoagulation protocol: Yes   Plan:  1. Decrease heparin drip to 800 units/hr (8 ml/hr) 2. Follow-up AM heparin level and CBC  Earleen Newport [id number] 11/09/2012,10:16 PM

## 2012-11-10 DIAGNOSIS — I309 Acute pericarditis, unspecified: Secondary | ICD-10-CM

## 2012-11-10 LAB — GLUCOSE, CAPILLARY
Glucose-Capillary: 103 mg/dL — ABNORMAL HIGH (ref 70–99)
Glucose-Capillary: 163 mg/dL — ABNORMAL HIGH (ref 70–99)
Glucose-Capillary: 154 mg/dL — ABNORMAL HIGH (ref 70–99)

## 2012-11-10 LAB — CBC
Hemoglobin: 10.1 g/dL — ABNORMAL LOW (ref 12.0–15.0)
MCH: 25.2 pg — ABNORMAL LOW (ref 26.0–34.0)
Platelets: 204 10*3/uL (ref 150–400)
RBC: 4.01 MIL/uL (ref 3.87–5.11)
WBC: 5.1 10*3/uL (ref 4.0–10.5)

## 2012-11-10 LAB — TROPONIN I: Troponin I: <0.3 ng/mL (ref <0.30)

## 2012-11-10 MED ORDER — HEPARIN (PORCINE) IN NACL 100-0.45 UNIT/ML-% IJ SOLN
650.0000 [IU]/h | INTRAMUSCULAR | Status: DC
  Administered 2012-11-10: 750 [IU]/h via INTRAVENOUS
  Filled 2012-11-10: qty 250

## 2012-11-10 NOTE — Progress Notes (Signed)
ANTICOAGULATION CONSULT NOTE - Follow Up Consult  Pharmacy Consult:  Heparin Indication: chest pain/ACS  No Known Allergies  Patient Measurements: Height: 5\' 3"  (160 cm) Weight: 130 lb 3.2 oz (59.058 kg) (a) IBW/kg (Calculated) : 52.4  Heparin Dosing Weight: 59kg  Vital Signs: Temp: 98.4 F (36.9 C) (01/01 1130) Temp src: Oral (01/01 1130) BP: 94/48 mmHg (01/01 1130) Pulse Rate: 56  (01/01 1130)  Labs:  Basename 11/10/12 1208 11/10/12 0737 11/10/12 0140 11/09/12 2054 11/09/12 1211 11/09/12 0530 11/08/12 0540  HGB -- 10.1* -- -- -- 8.9* --  HCT -- 32.4* -- -- -- 27.9* --  PLT -- 204 -- -- -- 198 --  APTT -- -- -- -- -- -- --  LABPROT -- -- -- -- -- -- --  INR -- -- -- -- -- -- --  HEPARINUNFRC -- 0.68 -- 0.73* -- -- --  CREATININE -- -- -- -- -- 1.81* 1.66*  CKTOTAL -- -- -- -- 51 -- --  CKMB -- -- -- -- 1.6 -- --  TROPONINI <0.30 <0.30 0.32* -- -- -- --    Estimated Creatinine Clearance: 20.5 ml/min (by C-G formula based on Cr of 1.81).      Assessment: 60 YOF admitted with CP to continue on IV heparin for ACS.  Heparin level therapeutic and toward the high end of normal.  No active bleeding per documentation.   Goal of Therapy:  Heparin level 0.3-0.7 units/ml Monitor platelets by anticoagulation protocol: Yes    Plan:  - Decrease heparin gtt slightly to 750 units/hr - Daily HL / CBC     Mandy Larson D. Mina Marble, PharmD, BCPS Pager:  [phone] 11/10/2012, 3:02 PM

## 2012-11-10 NOTE — Progress Notes (Signed)
Advanced Heart Failure Rounding Note   Subjective:   [name] is an 79 yo with history of HTN, DM2, and CKD presented on 12/28 with dyspnea and chest pain, she was found to be in acute pulmonary edema. Cardiac enzymes were mildly elevated.   Patient reports that for about a month, she has had dyspnea + chest tightness with minimal exertion. She has been taking care of her ill husband so had not mentioned this to family. On 12/28, she developed dyspnea at rest as well as chest tightness so was brought to the ER. Oxygen saturation was 70% and she was briefly on CPAP. She had pulmonary edema on CXR. Initially diuresed with IV lasix. Troponin peaked at 1.10 on the day of admission.   12 -29-13 Echo showed EF 35-40% and was suggestive of an inferior MI.    Objective:   Weight Range:  Vital Signs:   Temp:  [97.7 F (36.5 C)-98.9 F (37.2 C)] 97.7 F (36.5 C) (01/01 0730) Pulse Rate:  [50-65] 61  (01/01 0730) Resp:  [16-18] 17  (01/01 0730) BP: (78-111)/(38-59) 106/57 mmHg (01/01 0730) SpO2:  [98 %-100 %] 99 % (01/01 0730) Last BM Date: 11/08/12  Weight change: Filed Weights   11/08/12 0021 11/08/12 1507 11/09/12 0515  Weight: 136 lb 11 oz (62 kg) 130 lb 11.7 oz (59.3 kg) 130 lb 3.2 oz (59.058 kg)    Intake/Output:   Intake/Output Summary (Last 24 hours) at 11/10/12 [id number] Last data filed at 11/10/12 0708  Gross per 24 hour  Intake  832.5 ml  Output    800 ml  Net   32.5 ml     Physical Exam: General:  Chronically ill  No resp difficulty HEENT: normal Neck: supple. JVP flat. Carotids 2+ bilat; no bruits. No lymphadenopathy or thryomegaly appreciated. Cor: PMI nondisplaced. Regular rate & rhythm. 2/6 systolic murmur Lungs: clear 2 liters [place] Abdomen: soft, nontender, nondistended. No hepatosplenomegaly. No bruits or masses. Good bowel sounds. Extremities: no cyanosis, clubbing, rash, edema, slightly decreased skin turgur Neuro: alert & orientedx3, cranial nerves grossly intact.  moves all 4 extremities w/o difficulty. Affect pleasant  Telemetry: Sinus Rhythm   Labs: Basic Metabolic Panel:  Lab [id number] 0530 11/08/12 0540 11/07/12 0419 11/06/12 0607 11/06/12 0205  NA 141 143 143 -- 141  K 3.7 3.1* 3.7 -- 5.3*  CL 95* 96 102 -- 106  CO2 37* 37* 29 -- --  GLUCOSE 91 103* 106* -- 146*  BUN 49* 43* 43* -- 43*  CREATININE 1.81* 1.66* 1.70* 1.67* 1.70*  CALCIUM 9.4 9.4 9.0 -- --  MG -- -- -- -- --  PHOS -- -- -- -- --    Liver Function Tests: No results found for this basename: AST:5,ALT:5,ALKPHOS:5,BILITOT:5,PROT:5,ALBUMIN:5 in the last 168 hours No results found for this basename: LIPASE:5,AMYLASE:5 in the last 168 hours No results found for this basename: AMMONIA:3 in the last 168 hours  CBC:  Lab 11/09/12 0530 11/07/12 0419 11/06/12 0607 11/06/12 0205 11/06/12 0203  WBC 4.7 4.2 4.3 -- 5.3  NEUTROABS -- -- -- -- 3.8  HGB 8.9* 7.2* 7.3* 8.5* 7.0*  HCT 27.9* 22.5* 23.5* 25.0* 22.6*  MCV 79.7 78.7 79.7 -- 80.4  PLT 198 167 172 -- 185    Cardiac Enzymes:  Lab 11/10/12 0140 11/09/12 1756 11/09/12 1324 11/09/12 1211 11/07/12 0419 11/06/12 1750  CKTOTAL -- -- -- 51 -- --  CKMB -- -- -- 1.6 -- --  CKMBINDEX -- -- -- -- -- --  TROPONINI 0.32* 0.36* <0.30 -- 0.71* 0.84*    BNP: BNP (last 3 results)  Basename 11/08/12 0540 11/06/12 0203  PROBNP 7944.0* 6428.0*     Other results:  EKG:   Imaging: No results found.   Medications:     Scheduled Medications:    . aspirin  324 mg Oral Daily  . atorvastatin  40 mg Oral q1800  . carvedilol  6.25 mg Oral BID WC  . cyanocobalamin  1,000 mcg Subcutaneous Q1400  . gabapentin  300 mg Oral TID  . insulin aspart  0-9 Units Subcutaneous TID WC  . isosorbide mononitrate  30 mg Oral Daily  . levothyroxine  50 mcg Oral QAC breakfast  . potassium chloride  20 mEq Oral BID    Infusions:    . heparin 800 Units/hr (11/09/12 2223)    PRN Medications: acetaminophen, morphine injection,  oxyCODONE, sodium chloride Assessment/Plan 79 yo with DM, CKD, and HTN presented with acute pulmonary edema and chest pain. Cardiac enzymes mildly elevated and creatinine high. Also with significant anemia.   1. A/C systolic heart failure  EF 35-40% with evidence for inferior MI as well as severe MR.  I think she is still a bit volume depleted.   Will hold Lasix again today.    2. CAD -  Chest pain again today in setting of SBP in 70s.  Will continue ASA,  heparin gtt.  If hemoglobin stays stable, would treat with Plavix as well.  Will repeat cardiac enzymes, if significant rise and ongoing CP despite improved BP, may need to go to cath lab.  However, as I suspect a prior inferior MI based on echo, lack of marked troponin bump this admission, and low GFR (around 25), would try to manage conservatively.   3. Acute Renal Failure- Creatinine bump noted .   4. Anemia - Post transfusion hemoglobin 8.9. Will need full work up, unsure of etiology but does not seem to be actively bleeding.    5. Mitral Regurgitation-Severe, likely infarction-related MR (inferior MI).    Length of Stay: [facility]. 11/10/2012, 8:12 AM

## 2012-11-10 NOTE — Progress Notes (Signed)
Patient ID: Mandy Larson, female   DOB: 1930-03-24, 79 y.o.   MRN: [id number]  [facility] PROGRESS NOTE  Mandy Larson [id number] DOB: 11-May-1930 DOA: 11/06/2012 PCP: No primary provider on file.  Brief narrative: Pt is 79 yo female with history of HTN, DM2, and CKD presented on 12/28 with one month dyspnea and chest pain with even minimal exertion, she was found to be in acute pulmonary edema. Cardiac enzymes were mildly elevated. In ED, Oxygen saturation was 70% and she was briefly on CPAP. Pulmonary edema on CXR. Initially diuresed with IV lasix. Troponin peaked at 1.10 on the day of admission. 12 -29-13 Echo showed EF 35-40% and was suggestive of an inferior MI.   ASSESSMENT AND PLAN: 1. Pulmonary edema - secondary to acute systolic heart failure EF 35-40% with evidence of inferior MI as well as sever mitral regurgitation. As pt still volume depleted, hold Lasix today as per cardio recommendations.  2. NSTEMI, Inferior MI - in the setting of hypotension. SBP remain sin 100's, continue heparin gtt and ASA. Continuing to trend CE's and if increase noted may need cardiac cath. For now conservative management.  3. Hypotension-probably secondary to above - poor candidate for pressors as this would increase pain. SBP remains in low 100's.  4. Renal insufficiency - Creatinine up from yesterday, hold Lasix today and check BMP in AM 5. Hypokalemia - resolved 6. Microcytic anemia - Hg is stable post transfusion and at pt's baseline. No active bleeding noted.  7. DM - reasonable inpatient control   Consultants:  Cardiology  Procedures/Studies: 12/29 CXR --> Improved interstitial edema pattern with a slightly increased right pleural effusion. Improved right basilar density, ? residual alveolar edema with PNA not excluded.  Antibiotics:  None  Code Status: Full Family Communication: Pt at bedside Disposition Plan: Pt once pt able to participate   HPI/Subjective: No events overnight.    Objective: Filed Vitals:   11/10/12 0700 11/10/12 0730 11/10/12 0800 11/10/12 1130  BP: 106/57 106/57 110/58 94/48  Pulse: 55 61 60 56  Temp:  97.7 F (36.5 C)  98.4 F (36.9 C)  TempSrc:  Oral  Oral  Resp:  17  18  Height:      Weight:      SpO2:  99%  98%    Intake/Output Summary (Last 24 hours) at 11/10/12 1136 Last data filed at 11/10/12 0708  Gross per 24 hour  Intake  592.5 ml  Output    500 ml  Net   92.5 ml    Exam:   General:  Pt is alert, follows commands appropriately, not in acute distress  Cardiovascular: Regular rhythm, bradycardic, S1/S2, SEM 3/6, no rubs, no gallops  Respiratory: Clear to auscultation bilaterally, no wheezing, no crackles, no rhonchi  Abdomen: Soft, non tender, non distended, bowel sounds present, no guarding  Extremities: No edema, pulses DP and PT palpable bilaterally  Neuro: Grossly nonfocal  Data Reviewed: Basic Metabolic Panel:  Lab [id number] 0530 11/08/12 0540 11/07/12 0419 11/06/12 0607 11/06/12 0205  NA 141 143 143 -- 141  K 3.7 3.1* 3.7 -- 5.3*  CL 95* 96 102 -- 106  CO2 37* 37* 29 -- --  GLUCOSE 91 103* 106* -- 146*  BUN 49* 43* 43* -- 43*  CREATININE 1.81* 1.66* 1.70* 1.67* 1.70*  CALCIUM 9.4 9.4 9.0 -- --  MG -- -- -- -- --  PHOS -- -- -- -- --   CBC:  Lab 11/10/12 0737 11/09/12 0530 11/07/12 0419 11/06/12  [id number] 11/06/12 0205 11/06/12 0203  WBC 5.1 4.7 4.2 4.3 -- 5.3  NEUTROABS -- -- -- -- -- 3.8  HGB 10.1* 8.9* 7.2* 7.3* 8.5* --  HCT 32.4* 27.9* 22.5* 23.5* 25.0* --  MCV 80.8 79.7 78.7 79.7 -- 80.4  PLT 204 198 167 172 -- 185   Cardiac Enzymes:  Lab 11/10/12 0737 11/10/12 0140 11/09/12 1756 11/09/12 1324 11/09/12 1211 11/07/12 0419  CKTOTAL -- -- -- -- 51 --  CKMB -- -- -- -- 1.6 --  CKMBINDEX -- -- -- -- -- --  TROPONINI <0.30 0.32* 0.36* <0.30 -- 0.71*   CBG:  Lab 11/10/12 0731 11/09/12 2203 11/09/12 1618 11/09/12 1127 11/09/12 0639  GLUCAP 103* 264* 91 190* 102*    Recent Results (from the  past 240 hour(s))  MRSA PCR SCREENING     Status: Normal   Collection Time   11/06/12  8:04 AM      Component Value Range Status Comment   MRSA by PCR NEGATIVE  NEGATIVE Final      Scheduled Meds:   . aspirin  324 mg Oral Daily  . atorvastatin  40 mg Oral q1800  . carvedilol  6.25 mg Oral BID WC  . cyanocobalamin  1,000 mcg Subcutaneous Q1400  . gabapentin  300 mg Oral TID  . insulin aspart  0-9 Units Subcutaneous TID WC  . isosorbide mononitrate  30 mg Oral Daily  . levothyroxine  50 mcg Oral QAC breakfast  . potassium chloride  20 mEq Oral BID   Continuous Infusions:   . heparin 800 Units/hr (11/09/12 2223)     Faye Ramsay, MD  [facility] Pager [phone]  If 7PM-7AM, please contact night-coverage [email or web address] Password [email or web address] 11/10/2012, 11:36 AM   LOS: 4 days

## 2012-11-11 DIAGNOSIS — I5031 Acute diastolic (congestive) heart failure: Secondary | ICD-10-CM

## 2012-11-11 LAB — BASIC METABOLIC PANEL
BUN: 50 mg/dL — ABNORMAL HIGH (ref 6–23)
CO2: 35 mEq/L — ABNORMAL HIGH (ref 19–32)
Chloride: 96 mEq/L (ref 96–112)
GFR calc Af Amer: 29 mL/min — ABNORMAL LOW (ref >90)
Potassium: 4.6 mEq/L (ref 3.5–5.1)

## 2012-11-11 LAB — CBC
HCT: 28.8 % — ABNORMAL LOW (ref 36.0–46.0)
Platelets: 181 10*3/uL (ref 150–400)
RBC: 3.51 MIL/uL — ABNORMAL LOW (ref 3.87–5.11)
RDW: 16.2 % — ABNORMAL HIGH (ref 11.5–15.5)
WBC: 5 10*3/uL (ref 4.0–10.5)

## 2012-11-11 LAB — GLUCOSE, CAPILLARY
Glucose-Capillary: 114 mg/dL — ABNORMAL HIGH (ref 70–99)
Glucose-Capillary: 180 mg/dL — ABNORMAL HIGH (ref 70–99)

## 2012-11-11 LAB — HEPARIN LEVEL (UNFRACTIONATED): Heparin Unfractionated: 0.59 IU/mL (ref 0.30–0.70)

## 2012-11-11 MED ORDER — CARVEDILOL 3.125 MG PO TABS
3.1250 mg | ORAL_TABLET | Freq: Two times a day (BID) | ORAL | Status: DC
  Administered 2012-11-11 – 2012-11-12 (×3): 3.125 mg via ORAL
  Filled 2012-11-11 (×4): qty 1

## 2012-11-11 MED ORDER — ASPIRIN 81 MG PO CHEW
81.0000 mg | CHEWABLE_TABLET | Freq: Every day | ORAL | Status: DC
  Administered 2012-11-12: 81 mg via ORAL
  Filled 2012-11-11: qty 1

## 2012-11-11 MED ORDER — SENNA 8.6 MG PO TABS
1.0000 | ORAL_TABLET | Freq: Every day | ORAL | Status: DC | PRN
Start: 2012-11-11 — End: 2012-11-12
  Filled 2012-11-11: qty 1

## 2012-11-11 NOTE — Evaluation (Signed)
Physical Therapy Evaluation Patient Details Name: Mandy Larson MRN: [id number] DOB: 1930-07-01 Today's Date: 11/11/2012 Time: [id number] PT Time Calculation (min): 26 min  PT Assessment / Plan / Recommendation Clinical Impression  Mandy Larson is a pleasant 79 y/o female admitted for chest pain and found to have pulmonary edema in the setting of acute systolic heart failure as well as NSTEMI. Presents to PT today with below impairments as well as hypotension limiting her mobility. Will benefit physical therapy in the acute setting to maximize strength and independence for safe d/c home.     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    Equipment Recommendations  None recommended by PT    Recommendations for Other Services     Frequency Min 3X/week    Precautions / Restrictions Restrictions Weight Bearing Restrictions: No   Pertinent Vitals/Pain Supine BP:   100/51 Sitting BP:     91/49 Standing BP: 84/45 (sat patient back down and BP remained 85/49 so laid her back down per RN request, pt with minimal complaints of dizziness)       Mobility  Bed Mobility Bed Mobility: Supine to Sit;Sit to Supine Supine to Sit: 5: Supervision;HOB elevated (30 degrees) Sit to Supine: 5: Supervision;HOB elevated (30 degrees) Details for Bed Mobility Assistance: verbal sequencing cues and assist to negotiate lines Transfers Transfers: Sit to Stand;Stand to Sit Sit to Stand: 4: Min assist;With upper extremity assist Stand to Sit: 4: Min assist;With upper extremity assist Details for Transfer Assistance: min stability assist especially in end stages of standing Ambulation/Gait Ambulation/Gait Assistance: Not tested (comment) (bp too low)      Shoulder Instructions     Exercises General Exercises - Lower Extremity Ankle Circles/Pumps: AROM;Both;20 reps Toe Raises: AROM;Both;20 reps;Seated Heel Raises: AROM;Both;20 reps;Seated   PT Diagnosis: Difficulty walking;Generalized weakness  PT Problem List: Decreased strength;Decreased activity tolerance;Decreased balance;Decreased mobility;Cardiopulmonary status limiting activity PT Treatment Interventions: DME instruction;Gait training;Stair training;Functional mobility training;Therapeutic activities;Therapeutic exercise;Balance training;Neuromuscular re-education;Patient/family education   PT Goals Acute Rehab PT Goals PT Goal Formulation: With patient Time For Goal Achievement: 11/18/12 Potential to Achieve Goals: Good Pt will go Supine/Side to Sit: with modified independence PT Goal: Supine/Side to Sit - Progress: Goal set today Pt will go Sit to Supine/Side: with modified independence PT Goal: Sit to Supine/Side - Progress: Goal set today Pt will go Sit to Stand: with modified independence PT Goal: Sit to Stand - Progress: Goal set today Pt will go Stand to Sit: with modified independence PT Goal: Stand to Sit - Progress: Goal set today Pt will Transfer Bed to Chair/Chair to Bed: with modified independence PT Transfer Goal: Bed to Chair/Chair to Bed - Progress: Goal set today Pt will Ambulate: >150 feet;Independently PT Goal: Ambulate - Progress: Goal set today Pt will Go Up / Down Stairs: Flight;with modified independence;with rail(s) PT Goal: Up/Down Stairs - Progress: Goal set today Pt will Perform Home Exercise Program: Independently PT Goal: Perform Home Exercise Program - Progress: Goal set today  Visit Information  Last PT Received On: 11/11/12 Assistance Needed: +1    Subjective Data  Subjective: I take care of my sick husband.  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 with intermittent help from 2 sons and a granddaughter but all of them are employed Corporate investment banker: No difficulties    Cognition  Overall Cognitive Status: Appears within functional limits for tasks assessed/performed Arousal/Alertness: Awake/alert Orientation Level: Appears intact for tasks assessed Behavior During Session: [facility] for tasks performed    Extremity/Trunk Assessment Right Upper Extremity Assessment RUE ROM/Strength/Tone: [facility] for tasks assessed Left Upper Extremity Assessment LUE ROM/Strength/Tone: [facility] for tasks assessed Right Lower Extremity Assessment RLE ROM/Strength/Tone: [facility] for tasks assessed Left Lower Extremity Assessment LLE ROM/Strength/Tone: WFL for tasks assessed Trunk Assessment Trunk Assessment: Normal   Balance Balance Balance Assessed: Yes Static Standing Balance Static Standing - Balance Support: Right upper extremity supported Static Standing - Level of Assistance: 4: Min assist  End of Session PT - End of Session Equipment Utilized During Treatment: Gait belt Activity Tolerance: Patient tolerated treatment well Patient left: in bed Nurse Communication: Mobility status;Other (comment) (low BP with mobility)  GP     Mandy Larson 11/11/2012, 9:11 AM

## 2012-11-11 NOTE — Progress Notes (Signed)
ANTICOAGULATION CONSULT NOTE - Follow Up Consult  Pharmacy Consult:  Heparin Indication: chest pain/ACS  No Known Allergies  Patient Measurements: Height: 5\' 3"  (160 cm) Weight: 140 lb 10.5 oz (63.8 kg) IBW/kg (Calculated) : 52.4  Heparin Dosing Weight: 59kg  Vital Signs: Temp: 97.8 F (36.6 C) (01/02 0756) Temp src: Oral (01/02 0756) BP: 85/49 mmHg (01/02 0858) Pulse Rate: 61  (01/02 0858)  Labs:  Flo Shanks 11/11/12 0516 11/11/12 0024 11/10/12 1820 11/10/12 0737 11/09/12 2054 11/09/12 1211 11/09/12 0530  HGB 9.0* -- -- 10.1* -- -- --  HCT 28.8* -- -- 32.4* -- -- 27.9*  PLT 181 -- -- 204 -- -- 198  APTT -- -- -- -- -- -- --  LABPROT -- -- -- -- -- -- --  INR -- -- -- -- -- -- --  HEPARINUNFRC 0.59 -- -- 0.68 0.73* -- --  CREATININE 1.84* -- -- -- -- -- 1.81*  CKTOTAL -- -- -- -- -- 51 --  CKMB -- -- -- -- -- 1.6 --  TROPONINI <0.30 <0.30 <0.30 -- -- -- --    Estimated Creatinine Clearance: 21.9 ml/min (by C-G formula based on Cr of 1.84).  Assessment: 55 YOF admitted with CP to continue on IV heparin for ACS.  Heparin level therapeutic at 0.59 IU/ml and rate of 750 units/hr.  No bleeding complications noted.  She has had a drop in H/H (10.1>>9.0).  Her platelets have dropped slightly as well (204 >> 181) but remains wnl.  Will follow closely for s/s of bleeding complications.  Will decrease her rate a little more and strive to keep her between 0.3-0.5 IU/ml.  Her troponin has trended down.  Goal of Therapy:  Heparin level 0.3-0.7 units/ml Monitor platelets by anticoagulation protocol: Yes   Plan:  - Decrease heparin gtt slightly to 650 units/hr - Daily HL / CBC  Rober Minion, PharmD., MS Clinical Pharmacist Pager:  [phone] Thank you for allowing pharmacy to be part of this patients care team. 11/11/2012, 9:06 AM

## 2012-11-11 NOTE — Progress Notes (Signed)
Transferred to 72 by wheelchair, stable, belongings with pt. Report given to RN

## 2012-11-11 NOTE — Progress Notes (Signed)
Report received from nurse Chat RN on 2900, patient has been transported to 4700, stable vital signs; will continue to monitor patient. Ruben Im RN

## 2012-11-11 NOTE — Progress Notes (Signed)
Patient: Mandy Larson / Admit Date: 11/06/2012 / Date of Encounter: 11/11/2012, 9:15 AM   Subjective  79 yo with DM, CKD, and HTN presented with acute pulmonary edema and chest pain. Cardiac enzymes mildly elevated and creatinine high. Also with significant anemia. As OP for 1 month has had dyspnea + chest tightness with minimal exertion. She has been taking care of her ill husband so had not mentioned this to family. On 12/28, she developed dyspnea at rest as well as chest tightness so was brought to the ER. Oxygen saturation was 70% and she was briefly on CPAP. She had pulmonary edema on CXR. Initially diuresed with IV lasix. Troponin peaked at 1.10 on the day of admission. 12 -29-13 Echo showed EF 35-40% and was suggestive of an inferior MI.   TODAY: Denies pain, SOB, CP. Has not been sleeping completely flat. Has not had BM in several days but she ate some prunes this morning. BP dropped to 84/45 this AM when she went to get out of bed.   Objective   Telemetry: sinus brady with occasional PACs Physical Exam: Filed Vitals:   11/11/12 0900  BP: 100/32  Pulse: 62  Temp: 97.8  Resp: 14   General: Well developed thin elderly [name] in no acute distress. Head: Normocephalic, atraumatic, sclera non-icteric, no xanthomas, nares are without discharge. Neck: Negative for carotid bruits. JVD very minimally elevated. Lungs: Coarse BS L>R. No wheezes, rales, or rhonchi. Breathing is unlabored. Heart: RRR S1 S2 with 2/6 SEM best heard at apex. No rubs or gallops.  Abdomen: Soft, non-tender, non-distended with normoactive bowel sounds. No hepatomegaly. No rebound/guarding. No obvious abdominal masses. Msk:  Strength and tone appear normal for age. Extremities: No clubbing or cyanosis. No edema.  Distal pedal pulses are 2+ and equal bilaterally. Neuro: Alert and oriented X 3. Moves all extremities spontaneously. Psych:  Responds to questions appropriately with a normal affect.    Intake/Output  Summary (Last 24 hours) at 11/11/12 0915 Last data filed at 11/11/12 0900  Gross per 24 hour  Intake 807.09 ml  Output    600 ml  Net 207.09 ml    Inpatient Medications:    . aspirin  324 mg Oral Daily  . atorvastatin  40 mg Oral q1800  . carvedilol  6.25 mg Oral BID WC  . cyanocobalamin  1,000 mcg Subcutaneous Q1400  . gabapentin  300 mg Oral TID  . insulin aspart  0-9 Units Subcutaneous TID WC  . isosorbide mononitrate  30 mg Oral Daily  . levothyroxine  50 mcg Oral QAC breakfast  . potassium chloride  20 mEq Oral BID    Labs:  [facility] 11/11/12 0516 11/09/12 0530  NA 137 141  K 4.6 3.7  CL 96 95*  CO2 35* 37*  GLUCOSE 126* 91  BUN 50* 49*  CREATININE 1.84* 1.81*  CALCIUM 8.2* 9.4  MG -- --  PHOS -- --    Basename 11/11/12 0516 11/10/12 0737  WBC 5.0 5.1  NEUTROABS -- --  HGB 9.0* 10.1*  HCT 28.8* 32.4*  MCV 82.1 80.8  PLT 181 204    Basename 11/11/12 0516 11/11/12 0024 11/10/12 1820 11/10/12 1208 11/09/12 1211  CKTOTAL -- -- -- -- 51  CKMB -- -- -- -- 1.6  TROPONINI <0.30 <0.30 <0.30 <0.30 --    Radiology/Studies:  US Renal  11/06/2012  *RADIOLOGY REPORT*  Clinical Data: Renal insufficiency.  RENAL/URINARY TRACT ULTRASOUND COMPLETE  Comparison:  None.  Findings:  Right Kidney:  9.2 cm in craniocaudal length.  3.3 cm upper parapelvic cyst.  Increased echotexture throughout the kidney.  No hydronephrosis.  Left Kidney:  11.0 cm.  2.9 cm upper pole cyst.  Increased echotexture.  Mild pelvicaliectasis.  Bladder:  Grossly unremarkable.  IMPRESSION: Increased echotexture within the kidneys compatible with chronic medical renal disease.  Mild pelvicaliectasis on the left.  Bilateral renal cysts.   Original Report Authenticated By: Rolm Baptise, M.D.    Dg Chest Port 1 View  11/08/2012  *RADIOLOGY REPORT*  Clinical Data: Follow-up edema  PORTABLE CHEST - 1 VIEW  Comparison: 11/06/2012  Findings: Cardiac enlargement is again identified and remains unchanged in degree.  Mediastinal contours are unchanged with aortic calcification again noted.  There has been interval improvement in the interstitial edema pattern noted on the prior exam. There is a right pleural effusion identified which is slightly larger than on the previous exam. The left pleural effusion has resolved.  Improved bibasilar density is noted with persistent alveolar infiltrate seen at the right lung base.  This may represent residual basilar alveolar edema but an area of pneumonia is not excluded. No new areas of infiltrate are seen.  Linear density in the left midlung zone may represent scarring and underlying prominence of the interstitial markings is also likely chronic.  No new bony findings are suggested.  IMPRESSION: Improved interstitial edema pattern with a slightly increased right pleural effusion.  Persistent but improved right basilar density may represent residual alveolar edema with pneumonia not excluded.   Original Report Authenticated By: Ponciano Ort, M.D.    Dg Chest Portable 1 View  11/06/2012  *RADIOLOGY REPORT*  Clinical Data: Respiratory distress.  PORTABLE CHEST - 1 VIEW  Comparison: None.  Findings: The lungs are relatively well expanded.  Bibasilar airspace opacities may reflect pneumonia or possibly pulmonary edema, given underlying vascular congestion and small bilateral pleural effusions.  No pneumothorax is seen.  The cardiomediastinal silhouette is enlarged.  Calcification is noted in the aortic arch.  No acute osseous abnormalities are identified.  IMPRESSION: Bibasilar airspace opacities may reflect pneumonia or possibly pulmonary edema, given underlying vascular congestion, cardiomegaly, and small bilateral pleural effusions.   Original Report Authenticated By: Santa Lighter, M.D.      Assessment and Plan  1. Acute systolic CHF with pulmonary edema and acute respiratory failure - weight up 10lbs from 2 days ago, i/o -47 yesterday but clinically feels OK. Use of diuretics is  limited by BP and renal function, and have been on hold. Will discuss with MD. No ACEI due to renal status. BP too soft for addition of hydralazine.  2. CAD/NSTEMI with evidence for inferior MI - on ASA, heparin gtt, statin. Did not receive BB yesterday due to hypotension - will decrease dose to 3.125mg  BID as tolerated for more consistent dosing. Prior notes suggest possibly starting Plavix when Hgb stable (still variable). Enzymes negative now and pain free. ?DC heparin or decrease ASA to 81mg .  3. Hypotension - see above.  4. Mitral regurgitation, severe, likely infarction-related MR (inferior MI).  5. Acute on chronic renal insuffiency - BUN/Cr relatively stable from 12/31 with unclear baseline. Diuretics remain on hold. DC KCl.   6. Anemia - s/p 1u on 12/30. Hgb down 1g from yesterday but weight is also up so difficult to know if comparatively dilutional. Appreciate IM workup.  7. DM - per IM. 8. Constipation - try senna PRN if no BM with prunes.  Will discuss further plan with MD.  Signed,  Melina Copa PA-C   Attending Note:   The patient was seen and examined.  Agree with assessment and plan as noted above.  Changes to note were made as needed.   She has remained stable and is pain free.  OK to transfer to tele from our standpoint.  Dr. Aundra Dubin has stated that he would like to avoid cath if possible.  It is likely that she has had a previous Inferior MI that had caused MR and CHF but I agree that it probably was not recent - the Troponin elevation was only minimal.  DC heparin.  continue CHF meds as tolerated.     Thayer Headings, Brooke Bonito., MD, [facility] 11/11/2012, 10:25 AM

## 2012-11-11 NOTE — Progress Notes (Signed)
Patient ID: Mandy Larson, female   DOB: Aug 22, 1930, 79 y.o.   MRN: [id number] [facility] PROGRESS NOTE  Mandy Larson [id number] DOB: [birth year] DOA: 11/06/2012 PCP: No primary provider on file.  Brief narrative:  Pt is 79 yo female with history of HTN, DM2, and CKD presented on 12/28 with one month dyspnea and chest pain with even minimal exertion, she was found to be in acute pulmonary edema. Cardiac enzymes were mildly elevated. In ED, Oxygen saturation was 70% and she was briefly on CPAP. Pumonary edema on CXR. Initially diuresed with IV lasix. Troponin peaked at 1.10 on the day of admission. 12 -29-13 Echo showed EF 35-40% and was suggestive of an inferior MI.   ASSESSMENT AND PLAN:  1. Pulmonary edema - secondary to acute systolic heart failure EF 35-40% with evidence of inferior MI as well as severe mitral regurgitation. Holding off on Lasix given relatively low BP and pt not clinically volume overloaded even though weight is up 130 --> 140 lbs this AM, will ask RN to recheck the weight 2. NSTEMI, Inferior MI - in the setting of hypotension. SBP remains in 100's, continue ASA, pt off heparin per cardiology. Cardiac enzymes last 4 sets all normal. Conservative management at this time, avoiding cath for now given poor renal function. 3. Hypotension-probably secondary to above - poor candidate for pressors as this would increase pain. SBP remains in low 100's. Dose of coreg lowered per caridiology. 4. Renal insufficiency - Creatinine up from yesterday, hold Lasix today and check BMP in AM 5. Hypokalemia - resolved 6. Microcytic anemia - Hg drop since yesterday, no active bleeding. Continue ASA for now. CBC in AM 7. DM - reasonable inpatient control, A1C 12/28 = 5.9, continue SSI sensitive coverage for now   Consultants:  Cardiology Procedures/Studies:  US Renal 11/06/2012 --> Increased echotexture within the kidneys compatible with chronic medical renal disease. Mild pelvicaliectasis  on the left. Bilateral renal cysts.  Dg Chest Port 1 View 11/08/2012 --> mproved interstitial edema pattern with a slightly increased right pleural effusion. Persistent but improved right basilar density may represent residual alveolar edema with pneumonia not excluded.  12/29 CXR --> Improved interstitial edema pattern with a slightly increased right pleural effusion. Improved right basilar density, ? residual alveolar edema with PNA not excluded.  Antibiotics:  None  Code Status: Full  Family Communication: Pt at bedside  Disposition Plan: Transfer to telemetry  HPI/Subjective: No events overnight.  Pt denies chest pain or shortness of breath.  Objective: Filed Vitals:   11/11/12 0800 11/11/12 0858 11/11/12 0900 11/11/12 1000  BP: 113/52 85/49 100/32 97/50  Pulse:  61 62 58  Temp:      TempSrc:      Resp:      Height:      Weight:      SpO2:  99% 99% 100%    Intake/Output Summary (Last 24 hours) at 11/11/12 1110 Last data filed at 11/11/12 1038  Gross per 24 hour  Intake 780.09 ml  Output    550 ml  Net 230.09 ml    Exam:   General:  Pt is alert, follows commands appropriately, not in acute distress  Cardiovascular: Regular rate and rhythm, S1/S2, SEM 2/6, no rubs, no gallops  Respiratory: Course breath sounds, crackles R> L  Abdomen: Soft, non tender, non distended, bowel sounds present, no guarding  Extremities: No edema, pulses DP and PT palpable bilaterally  Neuro: Grossly nonfocal  Data Reviewed: Basic Metabolic Panel:  Lab  11/11/12 0516 11/09/12 0530 11/08/12 0540 11/07/12 0419 11/06/12 0607 11/06/12 0205  NA 137 141 143 143 -- 141  K 4.6 3.7 3.1* 3.7 -- 5.3*  CL 96 95* 96 102 -- 106  CO2 35* 37* 37* 29 -- --  GLUCOSE 126* 91 103* 106* -- 146*  BUN 50* 49* 43* 43* -- 43*  CREATININE 1.84* 1.81* 1.66* 1.70* 1.67* --  CALCIUM 8.2* 9.4 9.4 9.0 -- --  MG -- -- -- -- -- --  PHOS -- -- -- -- -- --   CBC:  Lab 11/11/12 0516 11/10/12 0737 11/09/12  0530 11/07/12 0419 11/06/12 0607 11/06/12 0203  WBC 5.0 5.1 4.7 4.2 4.3 --  NEUTROABS -- -- -- -- -- 3.8  HGB 9.0* 10.1* 8.9* 7.2* 7.3* --  HCT 28.8* 32.4* 27.9* 22.5* 23.5* --  MCV 82.1 80.8 79.7 78.7 79.7 --  PLT 181 204 198 167 172 --   Cardiac Enzymes:  Lab 11/11/12 0516 11/11/12 0024 11/10/12 1820 11/10/12 1208 11/10/12 0737 11/09/12 1211  CKTOTAL -- -- -- -- -- 51  CKMB -- -- -- -- -- 1.6  CKMBINDEX -- -- -- -- -- --  TROPONINI <0.30 <0.30 <0.30 <0.30 <0.30 --   CBG:  Lab 11/11/12 0754 11/10/12 2104 11/10/12 1627 11/10/12 1132 11/10/12 0731  GLUCAP 114* 154* 163* 249* 103*    Recent Results (from the past 240 hour(s))  MRSA PCR SCREENING     Status: Normal   Collection Time   11/06/12  8:04 AM      Component Value Range Status Comment   MRSA by PCR NEGATIVE  NEGATIVE Final      Scheduled Meds:   . aspirin  81 mg Oral Daily  . atorvastatin  40 mg Oral q1800  . carvedilol  3.125 mg Oral BID WC  . cyanocobalamin  1,000 mcg Subcutaneous Q1400  . gabapentin  300 mg Oral TID  . insulin aspart  0-9 Units Subcutaneous TID WC  . isosorbide mononitrate  30 mg Oral Daily  . levothyroxine  50 mcg Oral QAC breakfast   Continuous Infusions:    Mandy Ramsay, MD  [facility] Pager [phone]  If 7PM-7AM, please contact night-coverage [email or web address] Password [email or web address] 11/11/2012, 11:10 AM   LOS: 5 days

## 2012-11-11 NOTE — Care Management Note (Signed)
    Page 1 of 1   11/11/2012     2:01:13 PM   CARE MANAGEMENT NOTE 11/11/2012  Patient:  Mandy Larson, Mandy Larson   Account Number:  [id number]  Date Initiated:  11/11/2012  Documentation initiated by:  Elissa Hefty  Subjective/Objective Assessment:   adm w mi , chf     Action/Plan:   lives w husband   Anticipated DC Date:     Anticipated DC Plan:  [place]  CM consult      [facility] Choice  HOME HEALTH   Choice offered to / List presented to:  C-1 Patient        [place] arranged  [place].   Status of service:   Medicare Important Message given?   (If response is "NO", the following Medicare IM given date fields will be blank) Date Medicare IM given:   Date Additional Medicare IM given:    Discharge Disposition:  [place]  Per UR Regulation:  Reviewed for med. necessity/level of care/duration of stay  If discussed at [place] of Stay Meetings, dates discussed:    Comments:  11/11/12 1349 debbie Mandy Vanderpol rn,bsn phy ther rec hhpt and aid. will assist as md orders.spoke w pt and she is familiar w [facility] as they see her husb and would like them for hhpt and aid. made tentative ref. will ask md for orders and face to face.

## 2012-11-12 ENCOUNTER — Inpatient Hospital Stay (HOSPITAL_COMMUNITY): Payer: Medicare Other

## 2012-11-12 DIAGNOSIS — J811 Chronic pulmonary edema: Secondary | ICD-10-CM

## 2012-11-12 LAB — BASIC METABOLIC PANEL
BUN: 50 mg/dL — ABNORMAL HIGH (ref 6–23)
Calcium: 8.4 mg/dL (ref 8.4–10.5)
Creatinine, Ser: 1.72 mg/dL — ABNORMAL HIGH (ref 0.50–1.10)
GFR calc non Af Amer: 27 mL/min — ABNORMAL LOW (ref >90)
Glucose, Bld: 109 mg/dL — ABNORMAL HIGH (ref 70–99)

## 2012-11-12 LAB — CBC
HCT: 27.8 % — ABNORMAL LOW (ref 36.0–46.0)
Hemoglobin: 8.7 g/dL — ABNORMAL LOW (ref 12.0–15.0)
MCH: 25.7 pg — ABNORMAL LOW (ref 26.0–34.0)
MCHC: 31.3 g/dL (ref 30.0–36.0)
RDW: 16.3 % — ABNORMAL HIGH (ref 11.5–15.5)

## 2012-11-12 LAB — GLUCOSE, CAPILLARY: Glucose-Capillary: 143 mg/dL — ABNORMAL HIGH (ref 70–99)

## 2012-11-12 MED ORDER — FUROSEMIDE 20 MG PO TABS: 20.0000 mg | ORAL_TABLET | Freq: Two times a day (BID) | ORAL | Status: DC

## 2012-11-12 MED ORDER — GLIPIZIDE 5 MG PO TABS: 5.0000 mg | ORAL_TABLET | Freq: Every day | ORAL | Status: DC

## 2012-11-12 MED ORDER — ISOSORBIDE MONONITRATE ER 30 MG PO TB24: 30.0000 mg | ORAL_TABLET | Freq: Every day | ORAL | Status: DC

## 2012-11-12 MED ORDER — CARVEDILOL 3.125 MG PO TABS: 3.1250 mg | ORAL_TABLET | Freq: Two times a day (BID) | ORAL | Status: DC

## 2012-11-12 NOTE — Progress Notes (Signed)
Physical Therapy Treatment Patient Details Name: Mandy Larson MRN: [id number] DOB: 1930-10-18 Today's Date: 11/12/2012 Time: 0823-0900 PT Time Calculation (min): 37 min  PT Assessment / Plan / Recommendation Comments on Treatment Session  Admitted 12/28 with dyspnea and chest pain, she was found to be in acute pulmonary edema. Cardiac enzymes were mildly elevated with suspected MI. Still weak today and BP low limiting activity. Antalgic gait with right ankle pain and balance impaired. Will likely need to use RW with ambulation, will try next visit. To be clear I recommend a [place] aide for her husband given she will have limited activity tolerance when she goes home and I doubt she can manage all of his care independently.    Follow Up Recommendations  Home health PT [facility] aide for husband NOT for Mandy Larson, SW may need to be involved to get this arranged)     Does the patient have the potential to tolerate intense rehabilitation     Barriers to Discharge        Equipment Recommendations  Rolling walker with 5" wheels    Recommendations for Other Services    Frequency Min 3X/week   Plan Discharge plan remains appropriate;Frequency remains appropriate    Precautions / Restrictions Precautions Precautions: Fall Restrictions Weight Bearing Restrictions: No   Pertinent Vitals/Pain BP Sitting: 101/41 Standing: 97/45 SaO2: Pt on 3 liters SaO2 at 100% and pt able to maintain 96% on RA at rest, Ambulated on RA 100% initially but steady decline to 88% after about 100 ft and pt unable to recover without supplemental O2, left her on 2 liters of O2 and her SaO2 following session was 98% at rest, RN made aware    Mobility  Bed Mobility Bed Mobility: Supine to Sit Supine to Sit: 6: Modified independent (Device/Increase time) Transfers Transfers: Sit to Stand;Stand to Sit Sit to Stand: 5: Supervision;From chair/3-in-1;With upper extremity assist;With armrests;From bed;4: Min assist Stand to Sit:  5: Supervision;To chair/3-in-1;With upper extremity assist;With armrests Details for Transfer Assistance: supervision for pre-lift off and initial lift off but needing minA for stability upon release of upper extremity support at end stages of standing Ambulation/Gait Ambulation/Gait Assistance: 4: Min assist Ambulation Distance (Feet): 200 Feet Assistive device: 1 person hand held assist;Other (Comment) (pt holding onto wall railing ) Ambulation/Gait Assistance Details: pt fluctuated between HHA on the right and grabbing for the wall rail to improve stability, several LOB needing min-modA to correct and prevent a fall (more specifically in stance phase on the left during swing on the right pt fell to the left) Gait Pattern: Step-through pattern;Antalgic General Gait Details: antalgia ont he right (c/o right ankle pain, MD aware), slow and cautious type gait with several LOB laterally with decreased trunk control Stairs: No    Exercises General Exercises - Lower Extremity Ankle Circles/Pumps: AROM;Both;Other reps (comment) (30 reps) Toe Raises: AROM;Both;Seated Heel Raises: AROM;Both;20 reps;Seated     PT Goals Acute Rehab PT Goals PT Goal: Supine/Side to Sit - Progress: Met PT Goal: Sit to Stand - Progress: Progressing toward goal PT Goal: Stand to Sit - Progress: Progressing toward goal PT Transfer Goal: Bed to Chair/Chair to Bed - Progress: Progressing toward goal PT Goal: Ambulate - Progress: Progressing toward goal PT Goal: Perform Home Exercise Program - Progress: Progressing toward goal  Visit Information  Last PT Received On: 11/12/12 Assistance Needed: +1    Subjective Data  Subjective: I feel fine.    Cognition  Overall Cognitive Status: Appears within functional limits for  tasks assessed/performed Arousal/Alertness: Awake/alert Orientation Level: Appears intact for tasks assessed Behavior During Session: [facility] for tasks performed    Balance     End of Session PT -  End of Session Equipment Utilized During Treatment: Gait belt Activity Tolerance: Patient tolerated treatment well Patient left: in chair;with call bell/phone within reach Nurse Communication: Mobility status;Other (comment) (BP)   GP     WHITLOW,Mandy Larson 11/12/2012, 10:17 AM

## 2012-11-12 NOTE — Progress Notes (Signed)
Patient evaluated for community based chronic disease management services with [facility] as a benefit of patient's [employer]. Patient will receive a post discharge transition of care call and will be evaluated for monthly home visits for assessments and disease process education. Spoke with patient at bedside to explain [place] [facility].  Written consents obtained.  Left contact information and [facility] literature at bedside.  PCP is [name].  Called PCP office to verify and notified them of patients hospitalization.  Patient has a new heart failure diagnosis and would benefit from ongoing education and management at home until stable.  Her husband is currently being followed by [facility] for PT and she would like a [facility] RN for education with them. Of note, [facility] Care Management services does not replace or interfere with any services that are arranged by inpatient case management or social work.  For additional questions or referrals please contact Corliss Blacker BSN RN [facility] Liaison at [phone].

## 2012-11-12 NOTE — Progress Notes (Signed)
Advanced Heart Failure Rounding Note   Subjective:   Mandy Larson is an 79 yo with history of HTN, DM2, and CKD presented on 12/28 with dyspnea and chest pain, she was found to be in acute pulmonary edema. Cardiac enzymes were mildly elevated.   Patient reports that for about a month, she has had dyspnea + chest tightness with minimal exertion. She has been taking care of her ill husband so had not mentioned this to family. On 12/28, she developed dyspnea at rest as well as chest tightness so was brought to the ER. Oxygen saturation was 70% and she was briefly on CPAP. She had pulmonary edema on CXR. Initially diuresed with IV lasix. Troponin peaked at 1.10 on the day of admission.   12 -29-13 Echo showed EF 35-40% and was suggestive of an inferior MI.   Difficult time with BP yesterday, systolic 84.  No chest pain.  No orthopnea/PND.  Ambulating with PT  Objective:   Weight Range:  Vital Signs:   Temp:  [97.3 F (36.3 C)-98 F (36.7 C)] 97.3 F (36.3 C) (01/03 0431) Pulse Rate:  [58-66] 66  (01/03 0431) Resp:  [16-18] 18  (01/03 0431) BP: (85-115)/(32-63) 115/54 mmHg (01/03 0431) SpO2:  [98 %-100 %] 98 % (01/03 0431) Weight:  [136 lb 14.4 oz (62.097 kg)-137 lb 5.6 oz (62.3 kg)] 136 lb 14.4 oz (62.097 kg) (01/03 0431) Last BM Date: 11/08/12  Weight change: Filed Weights   11/11/12 0600 11/11/12 1539 11/12/12 0431  Weight: 140 lb 10.5 oz (63.8 kg) 137 lb 5.6 oz (62.3 kg) 136 lb 14.4 oz (62.097 kg)    Intake/Output:   Intake/Output Summary (Last 24 hours) at 11/12/12 0844 Last data filed at 11/12/12 0829  Gross per 24 hour  Intake 1080.5 ml  Output    850 ml  Net  230.5 ml     Physical Exam: General:  Chronically ill  No resp difficulty HEENT: normal Neck: supple. JVP flat. Carotids 2+ bilat; no bruits. No lymphadenopathy or thryomegaly appreciated. Cor: PMI nondisplaced. Regular rate & rhythm. No rubs, gallops or murmurs. Lungs: clear 2 liters [place] Abdomen: soft, nontender,  nondistended. No hepatosplenomegaly. No bruits or masses. Good bowel sounds. Extremities: no cyanosis, clubbing, rash, edema Neuro: alert & orientedx3, cranial nerves grossly intact. moves all 4 extremities w/o difficulty. Affect pleasant  Telemetry: Sinus Rhythm   Labs: Basic Metabolic Panel:  Lab [id number] 11/11/12 0516 11/09/12 0530 11/08/12 0540 11/07/12 0419  NA 141 137 141 143 143  K 4.9 4.6 3.7 3.1* 3.7  CL 100 96 95* 96 102  CO2 34* 35* 37* 37* 29  GLUCOSE 109* 126* 91 103* 106*  BUN 50* 50* 49* 43* 43*  CREATININE 1.72* 1.84* 1.81* 1.66* 1.70*  CALCIUM 8.4 8.2* 9.4 -- --  MG -- -- -- -- --  PHOS -- -- -- -- --    Liver Function Tests: No results found for this basename: AST:5,ALT:5,ALKPHOS:5,BILITOT:5,PROT:5,ALBUMIN:5 in the last 168 hours No results found for this basename: LIPASE:5,AMYLASE:5 in the last 168 hours No results found for this basename: AMMONIA:3 in the last 168 hours  CBC:  Lab 11/12/12 0538 11/11/12 0516 11/10/12 0737 11/09/12 0530 11/07/12 0419 11/06/12 0203  WBC 4.8 5.0 5.1 4.7 4.2 --  NEUTROABS -- -- -- -- -- 3.8  HGB 8.7* 9.0* 10.1* 8.9* 7.2* --  HCT 27.8* 28.8* 32.4* 27.9* 22.5* --  MCV 82.0 82.1 80.8 79.7 78.7 --  PLT 168 181 204 198 167 --  Cardiac Enzymes:  Lab 11/11/12 0516 11/11/12 0024 11/10/12 1820 11/10/12 1208 11/10/12 0737 11/09/12 1211  CKTOTAL -- -- -- -- -- 51  CKMB -- -- -- -- -- 1.6  CKMBINDEX -- -- -- -- -- --  TROPONINI <0.30 <0.30 <0.30 <0.30 <0.30 --    BNP: BNP (last 3 results)  Basename 11/08/12 0540 11/06/12 0203  PROBNP 7944.0* 6428.0*     Imaging: No results found.   Medications:     Scheduled Medications:    . aspirin  81 mg Oral Daily  . atorvastatin  40 mg Oral q1800  . carvedilol  3.125 mg Oral BID WC  . cyanocobalamin  1,000 mcg Subcutaneous Q1400  . gabapentin  300 mg Oral TID  . insulin aspart  0-9 Units Subcutaneous TID WC  . isosorbide mononitrate  30 mg Oral Daily  .  levothyroxine  50 mcg Oral QAC breakfast    Infusions:    PRN Medications: acetaminophen, morphine injection, oxyCODONE, senna, sodium chloride   Assessment/Plan 79 yo with DM, CKD, and HTN presented with acute pulmonary edema and chest pain. Cardiac enzymes mildly elevated and creatinine high. Also with significant anemia.   1. A/C systolic heart failure:  EF 35-40% with evidence for inferior MI as well as severe MR.  Pulmonary edema resolved. JVD not elevated and she is not short of breath ambulating.  Will hold off on Lasix today with elevated creatinine.  She does not have much BP room for adding afterload reduction.    2. CAD: No chest pain.  Suspect a prior inferior MI based on echo (maybe a couple of weeks prior to admission given symptoms).  With lack of marked troponin bump this admission and low GFR (around 25), would try to manage conservatively. Will continue ASA, statin.  Would hold off on Plavix for now anemia and downtrend in hemoglobin.     3. CKD: Renal function has been stably impaired.      4. Mitral Regurgitation: Severe, likely infarction-related MR (inferior MI).   5. Anemia: Microcytic.  Required transfusion.  Slow downtrend. Workup per [facility].    Length of Stay: 6  Loralie Champagne 11/12/2012 8:49 AM

## 2012-11-12 NOTE — Progress Notes (Signed)
SATURATION QUALIFICATIONS: (This note is used to comply with regulatory documentation for home oxygen)  Patient Saturations on Room Air at Rest = %98  Patient Saturations on Room Air while Ambulating = %91  Patient Saturations on 2 Liters of oxygen while Ambulating = %100  Please briefly explain why patient needs home oxygen: n/a

## 2012-11-12 NOTE — Discharge Summary (Signed)
Physician Discharge Summary  Namita Debold HH:[id number] DOB: 01/21/1930 DOA: 11/06/2012  PCP: No primary provider on file.  Admit date: 11/06/2012 Discharge date: 11/12/2012  Recommendations for Outpatient Follow-up:  1. Pt will need to follow up with PCP in 2-3 weeks post discharge 2. Please obtain BMP to evaluate electrolytes and kidney function 3. Please also check CBC to evaluate Hg and Hct levels 4. Please note that following medications were stopped due to low BP: Norvasc, Doxazosin, Lisinopril, Atenolol 5. Lasix 20 mg PO QD prescribed along with Coreg and Imdur 6. Pt will be called by cardiology 01/06 for appointment time and date 72. Please note that Metformin was discontinued due to renal failure and pt started on Glipizide 8. Dose of Glipizide can be increased based on CBG control 9. PATIENT'S WEIGHT ON DISCHARGE 136 LBS  Discharge Diagnoses: Acute respiratory failure secondary to pulmonary edema  Principal Problem:  *Pulmonary edema Active Problems:  Chest pain  HTN (hypertension)  DM2 (diabetes mellitus, type 2)  Renal insufficiency  Acute systolic congestive heart failure, NYHA class 2  Acute diastolic congestive heart failure, NYHA class 2  Pulmonary hypertension  Severe mitral regurgitation  Acute pericardial effusion-small  Discharge Condition: Stable  Diet recommendation: Heart healthy diet discussed in details   Brief narrative:  Pt is 79 yo female with history of HTN, DM2, and CKD presented on 12/28 with one month dyspnea and chest pain with even minimal exertion, she was found to be in acute pulmonary edema. Cardiac enzymes were mildly elevated. In ED, Oxygen saturation was 70% and she was briefly on CPAP. Pumonary edema on CXR. Initially diuresed with IV lasix. Troponin peaked at 1.10 on the day of admission. 12 -29-13 Echo showed EF 35-40% and was suggestive of an inferior MI.   ASSESSMENT AND PLAN:  1. Pulmonary edema - secondary to acute systolic heart  failure EF 35-40% with evidence of inferior MI as well as severe mitral regurgitation. Holding off on Lasix given relatively low BP and pt not clinically volume overloaded. Cardiology has cleared pt for discharge and have recommended low dose lasix to be started one day post discharge 20 mg PO daily. They will call pt with the appointment time and date. Pt will need BMP checked next week. Pt's weigh on discharge 136 lbs 2. NSTEMI, Inferior MI - in the setting of hypotension. SBP remains in 100's, continue ASA, pt off heparin per cardiology. Cardiac enzymes last 4 sets all normal. Conservative management at this time, avoiding cath for now given poor renal function. 3. Hypotension-probably secondary to above - poor candidate for pressors as this would increase pain. SBP improved and in 110's. Advised pt to start lasix tomorrow AM 20 mg daily until seen by cardiologist.. 4. Renal insufficiency - Creatinine down from yesterday, hold Lasix today and start tomorrow AM 5. Hypokalemia - resolved 6. Microcytic anemia - Hg drop since yesterday, no active bleeding. Continue ASA for now. CBC in AM 7. DM - reasonable inpatient control, A1C 12/28 = 5.9, continued SSI sensitive coverage while inpatient and metformin discontinued due to renal failure. Pt started on low dose Glipizide.  Consultants:  Cardiology Procedures/Studies:  US Renal 11/06/2012 --> Increased echotexture within the kidneys compatible with chronic medical renal disease. Mild pelvicaliectasis on the left. Bilateral renal cysts.  Dg Chest Port 1 View 11/08/2012 --> mproved interstitial edema pattern with a slightly increased right pleural effusion. Persistent but improved right basilar density may represent residual alveolar edema with pneumonia not excluded.  12/29  CXR --> Improved interstitial edema pattern with a slightly increased right pleural effusion. Improved right basilar density, ? residual alveolar edema with PNA not excluded.    Antibiotics:  None  Code Status: Full  Family Communication: Pt at bedside   Discharge Exam: Filed Vitals:   11/12/12 1335  BP: 111/50  Pulse: 60  Temp: 97 F (36.1 C)  Resp: 18   Filed Vitals:   11/12/12 0431 11/12/12 0845 11/12/12 1217 11/12/12 1335  BP: 115/54 97/45 112/52 111/50  Pulse: 66  60 60  Temp: 97.3 F (36.3 C)  97.5 F (36.4 C) 97 F (36.1 C)  TempSrc: Oral  Oral Oral  Resp: 18   18  Height:      Weight: 62.097 kg (136 lb 14.4 oz)     SpO2: 98% 88%  100%    General: Pt is alert, follows commands appropriately, not in acute distress Cardiovascular: Regular rate and rhythm, S1/S2 +, no murmurs, no rubs, no gallops Respiratory: Clear to auscultation bilaterally, no wheezing, no crackles, no rhonchi Abdominal: Soft, non tender, non distended, bowel sounds +, no guarding Extremities: no edema, no cyanosis, pulses palpable bilaterally DP and PT Neuro: Grossly nonfocal  Discharge Instructions  Discharge Orders    Future Orders Please Complete By Expires   Diet - low sodium heart healthy      Increase activity slowly          Medication List     As of 11/12/2012  3:30 PM    STOP taking these medications         amLODipine 10 MG tablet   Commonly known as: NORVASC      atenolol 50 MG tablet   Commonly known as: TENORMIN      doxazosin 4 MG tablet   Commonly known as: CARDURA      lisinopril 40 MG tablet   Commonly known as: PRINIVIL,ZESTRIL      metFORMIN 500 MG tablet   Commonly known as: GLUCOPHAGE      TAKE these medications         aspirin EC 81 MG tablet   Take 81 mg by mouth daily.      carvedilol 3.125 MG tablet   Commonly known as: COREG   Take 1 tablet (3.125 mg total) by mouth 2 (two) times daily with a meal.      chlorthalidone 25 MG tablet   Commonly known as: HYGROTON   Take 25 mg by mouth daily.      furosemide 20 MG tablet   Commonly known as: LASIX   Take 1 tablet (20 mg total) by mouth 2 (two) times daily.       gabapentin 300 MG capsule     Commonly known as: NEURONTIN    Take 300 mg by mouth 3 (three) times daily.     Imdur 30 mg tablet once daily       glipiZIDE 5 MG tablet   Commonly known as: GLUCOTROL   Take 1 tablet (5 mg total) by mouth daily.      levothyroxine 50 MCG tablet   Commonly known as: SYNTHROID, LEVOTHROID   Take 50 mcg by mouth daily.      simvastatin 20 MG tablet   Commonly known as: ZOCOR   Take 20 mg by mouth every evening.           Follow-up Information    Follow up with Darden Amber., MD. (you will be called from cardiology office on  Monday 11/15/2012)    Contact information:   [place] [id number] [phone]           The results of significant diagnostics from this hospitalization (including imaging, microbiology, ancillary and laboratory) are listed below for reference.     Microbiology: Recent Results (from the past 240 hour(s))  MRSA PCR SCREENING     Status: Normal   Collection Time   11/06/12  8:04 AM      Component Value Range Status Comment   MRSA by PCR NEGATIVE  NEGATIVE Final      Labs: Basic Metabolic Panel:  Lab [id number] 11/11/12 0516 11/09/12 0530 11/08/12 0540 11/07/12 0419  NA 141 137 141 143 143  K 4.9 4.6 3.7 3.1* 3.7  CL 100 96 95* 96 102  CO2 34* 35* 37* 37* 29  GLUCOSE 109* 126* 91 103* 106*  BUN 50* 50* 49* 43* 43*  CREATININE 1.72* 1.84* 1.81* 1.66* 1.70*  CALCIUM 8.4 8.2* 9.4 9.4 9.0  MG -- -- -- -- --  PHOS -- -- -- -- --   CBC:  Lab 11/12/12 0538 11/11/12 0516 11/10/12 0737 11/09/12 0530 11/07/12 0419 11/06/12 0203  WBC 4.8 5.0 5.1 4.7 4.2 --  NEUTROABS -- -- -- -- -- 3.8  HGB 8.7* 9.0* 10.1* 8.9* 7.2* --  HCT 27.8* 28.8* 32.4* 27.9* 22.5* --  MCV 82.0 82.1 80.8 79.7 78.7 --  PLT 168 181 204 198 167 --   Cardiac Enzymes:  Lab 11/11/12 0516 11/11/12 0024 11/10/12 1820 11/10/12 1208 11/10/12 0737 11/09/12 1211  CKTOTAL -- -- -- -- -- 51  CKMB -- -- -- -- -- 1.6    CKMBINDEX -- -- -- -- -- --  TROPONINI <0.30 <0.30 <0.30 <0.30 <0.30 --   BNP: BNP (last 3 results)  Basename 11/08/12 0540 11/06/12 0203  PROBNP 7944.0* 6428.0*   CBG:  Lab 11/12/12 1119 11/12/12 0615 11/11/12 2107 11/11/12 1610 11/11/12 1223  GLUCAP 156* 105* 157* 73 180*     SIGNED: Time coordinating discharge: Over 30 minutes  Faye Ramsay, MD  [facility] 11/12/2012, 3:30 PM Pager [phone]  If 7PM-7AM, please contact night-coverage [email or web address] Password [email or web address]

## 2012-11-18 ENCOUNTER — Telehealth: Payer: Self-pay | Admitting: Cardiovascular Disease

## 2012-11-18 NOTE — Telephone Encounter (Signed)
Called home to talk with pt regarding salt intake and medications, nurse was there for intro visit, I spoke with her and she will address salt and review med's and I gave ok to set her up for CHF teaching with wt/ BP tele/ equipment. Informed her of ov date/time.

## 2012-11-18 NOTE — Telephone Encounter (Signed)
New Problem:     Called in wanting to speak with you about a finding related to medication compliance.  Please call back.

## 2012-11-18 NOTE — Telephone Encounter (Signed)
Physical therapist/ heather states that the pt has a great knowledge deficit, she does not know her meds, She is not taking her meds as prescribed,  she is eating pizza during visit. Skilled nursing has an evaluation today.

## 2012-11-22 ENCOUNTER — Telehealth: Payer: Self-pay | Admitting: Cardiovascular Disease

## 2012-11-22 NOTE — Telephone Encounter (Signed)
Pt asymptomatic per nurse, told her to call with symptoms so medications can be adjusted, pt will have home monitoring system in home soon and will have more consistent bp monitoring pt to call with further questions,  nurse agreed to plan,

## 2012-11-22 NOTE — Telephone Encounter (Signed)
Mandy Larson went to see pt on Thursday of last week and her b/p was low and today it was 92/60 and when she stood up it was 98/55 and she is not symptomatic with it but she still wanted to let you know

## 2012-11-30 ENCOUNTER — Encounter: Payer: Self-pay | Admitting: Cardiovascular Disease

## 2012-11-30 ENCOUNTER — Ambulatory Visit (INDEPENDENT_AMBULATORY_CARE_PROVIDER_SITE_OTHER): Payer: Medicare Other | Admitting: Cardiovascular Disease

## 2012-11-30 VITALS — BP 124/72 | HR 79 | Ht 63.0 in | Wt 131.8 lb

## 2012-11-30 DIAGNOSIS — I509 Heart failure, unspecified: Secondary | ICD-10-CM

## 2012-11-30 DIAGNOSIS — I5021 Acute systolic (congestive) heart failure: Secondary | ICD-10-CM

## 2012-11-30 DIAGNOSIS — I10 Essential (primary) hypertension: Secondary | ICD-10-CM

## 2012-11-30 LAB — BASIC METABOLIC PANEL
CO2: 33 mEq/L — ABNORMAL HIGH (ref 19–32)
Chloride: 94 mEq/L — ABNORMAL LOW (ref 96–112)
Glucose, Bld: 83 mg/dL (ref 70–99)
Potassium: 4.3 mEq/L (ref 3.5–5.1)
Sodium: 135 mEq/L (ref 135–145)

## 2012-11-30 NOTE — Progress Notes (Signed)
Trayce Hillegass Date of Birth  04-19-1930       [facility]    [employer] [address], [address], [place], [place]  [address]   [place], [place]  [address] [phone]     [phone]   Fax  [phone]    Fax [phone]  Problem List: 1. Coronary artery disease-status post myocardial infarction/ACS 2. Acute on Chronic Systolic CHF - though he has a no Left ventricle:  mildly dilated, EF 35% to 40%. akinesis of the entire inferior and inferoseptal myocardium. Grade 2  diastolic dysfunction. - Mitral valve: Calcified annulus. There was likely dysfunction/tethering of the posterior papillary muscle as a result of inferior infarct. Severe regurgitation directed centrally and posteriorly. - Right ventricle: The cavity size was mildly dilated. Wall thickness was normal. - Pulmonic valve: Moderate regurgitation. - Pulmonary arteries: Systolic pressure was moderately increased. PA peak pressure: 54mm Hg (S). 3. Hypertension 4. Mitral regurgitation-severe-likely cause of her inferior wall myocardial infarction 5. Type 2 diabetes mellitus 6.  Hyperlipidemia 7. Chronic kidney disease - baseline Creatinine = 1.72  History of Present Illness: Ms. Januszewski is an 79 yo female who was admitted to the hospital in December, 2013.  She was originally seen in consultation by Dr. Aundra Dubin. She presented with an acute coronary syndrome. She was found to have mildly positive cardiac enzymes. An echocardiogram revealed inferior akinesis. She has an ejection fraction of around 35%. She has severe mitral regurgitation. She also has moderate pulmonary hypertension.  We did not perform a cardiac catheterization because of her chronic kidney disease. She did very well in that local therapy and was discharged in satisfactory condition.   She's done very well on medical therapy. She's not had any further episodes of  chest pain or shortness of breath.   Current  Outpatient Prescriptions on File Prior to Visit  Medication Sig Dispense Refill  . aspirin EC 81 MG tablet Take 81 mg by mouth daily.      . carvedilol (COREG) 3.125 MG tablet Take 1 tablet (3.125 mg total) by mouth 2 (two) times daily with a meal.  60 tablet  0  . chlorthalidone (HYGROTON) 25 MG tablet Take 25 mg by mouth daily.      . furosemide (LASIX) 20 MG tablet Take 1 tablet (20 mg total) by mouth 2 (two) times daily.  30 tablet  0  . gabapentin (NEURONTIN) 300 MG capsule Take 300 mg by mouth 3 (three) times daily.      Marland Kitchen glipiZIDE (GLUCOTROL) 5 MG tablet Take 1 tablet (5 mg total) by mouth daily.  30 tablet  1  . isosorbide mononitrate (IMDUR) 30 MG 24 hr tablet Take 1 tablet (30 mg total) by mouth daily.  30 tablet  1  . levothyroxine (SYNTHROID, LEVOTHROID) 50 MCG tablet Take 50 mcg by mouth daily.      . simvastatin (ZOCOR) 20 MG tablet Take 20 mg by mouth every evening.        No Known Allergies  Past Medical History  Diagnosis Date  . Diabetes mellitus without complication   . Anginal pain   . Hypertension   . CKD (chronic kidney disease)   . Anemia   . Acute systolic congestive heart failure, NYHA class 2 11/08/2012  . Former smoker   . Hyperlipidemia     Past Surgical History  Procedure Date  . Eye surgery     History  Smoking status  . Former  Smoker  . Types: Cigarettes  . Quit date: 11/09/1999  Smokeless tobacco  . Not on file    History  Alcohol Use No    No family history on file.  Reviw of Systems:  Reviewed in the HPI.  All other systems are negative.  Physical Exam: Blood pressure 124/72, pulse 79, height 5\' 3"  (1.6 m), weight 131 lb 12.8 oz (59.784 kg), SpO2 93.00%. General: Well developed, well nourished, in no acute distress.  Head: Normocephalic, atraumatic, sclera non-icteric, mucus membranes are moist,   Neck: Supple. Carotids are 2 + without bruits. No JVD   Lungs: Clear   Heart: RR, normal S1, S2, soft systolic  murmur  Abdomen: Soft, non-tender, non-distended with normal bowel sounds.  Msk:  Strength and tone are normal   Extremities: No clubbing or cyanosis. No edema.  Distal pedal pulses are 2+ and equal    Neuro: CN II - XII intact.  Alert and oriented X 3.   Psych:  Normal   ECG:   Assessment / Plan:  By your has

## 2012-11-30 NOTE — Patient Instructions (Addendum)
Your physician recommends that you return for lab work in: today bmet  Your physician has recommended you make the following change in your medication:   [place] / HYGROTON-   Your physician recommends that you schedule a follow-up appointment in: [place]

## 2012-11-30 NOTE — Assessment & Plan Note (Signed)
Mandy Larson seems to be doing quite well. We'll continue with her Lasix , carvedilol, and Imdur. We have not started her on an ACE inhibitor or ARB because of her renal insufficiency.  She's feeling quite well. We will anticipate getting a followup echocardiogram later this year after she's had some time to recover.  We have not been inclined to do a heart catheterization because of her renal insufficiency and because she is doing so well clinically.

## 2012-12-04 ENCOUNTER — Telehealth: Payer: Self-pay | Admitting: Physician Assistant

## 2012-12-04 DIAGNOSIS — I5022 Chronic systolic (congestive) heart failure: Secondary | ICD-10-CM

## 2012-12-04 MED ORDER — FUROSEMIDE 20 MG PO TABS: 20.0000 mg | ORAL_TABLET | Freq: Two times a day (BID) | ORAL | Status: DC

## 2012-12-04 NOTE — Telephone Encounter (Signed)
[facility] PT called. Patient was only given 30 day supply of Lasix at d/c.  She is now out and has not gotten refills from office. Lasix sent to her pharmacy. Richardson Dopp, PA-C  7:09 PM 12/04/2012

## 2012-12-07 NOTE — Telephone Encounter (Signed)
noted 

## 2012-12-14 ENCOUNTER — Telehealth: Payer: Self-pay | Admitting: Cardiovascular Disease

## 2012-12-14 ENCOUNTER — Telehealth: Payer: Self-pay | Admitting: *Deleted

## 2012-12-14 DIAGNOSIS — I5022 Chronic systolic (congestive) heart failure: Secondary | ICD-10-CM

## 2012-12-14 MED ORDER — FUROSEMIDE 20 MG PO TABS: 20.0000 mg | ORAL_TABLET | Freq: Every day | ORAL | Status: DC

## 2012-12-14 MED ORDER — CARVEDILOL 3.125 MG PO TABS: 3.1250 mg | ORAL_TABLET | Freq: Two times a day (BID) | ORAL | Status: DC

## 2012-12-14 NOTE — Telephone Encounter (Signed)
Pt contacted and agreed to med change and lab date.

## 2012-12-14 NOTE — Telephone Encounter (Signed)
Message copied by Jonathon Jordan on Tue Dec 14, 2012  2:21 PM ------      Message from: Thayer Headings      Created: Tue Nov 30, 2012  6:40 PM       Creatinine is a bit higher.  Decrease lasix to 20 daily.  Recheck BMP in 1 month.

## 2012-12-14 NOTE — Telephone Encounter (Signed)
Called pt and left msg w/ family that rx was sent,

## 2012-12-14 NOTE — Telephone Encounter (Signed)
New Problem     Pt was seen in the hospital and was prescribed Coreg. Nurse is requesting pt be prescribed Coreg again from Dr. Acie Fredrickson.

## 2013-01-05 ENCOUNTER — Telehealth: Payer: Self-pay | Admitting: *Deleted

## 2013-01-05 NOTE — Telephone Encounter (Signed)
2+ pitting edema,  left leg worse than right. No other symptoms. Pt has eaten at [employer]/ chicken and biscuits, told nurse to make sure she is aware of increased sodium correlation with holding fluids and worsening chf/ renal insuff. Nurse will council further, per dr Acie Fredrickson, increase lasix to 40 mg for three days, needs bmet 01/10/13, April will draw labs then, see prior note from today and nurse will continue to teach. To call with further questions or concerns.

## 2013-01-05 NOTE — Telephone Encounter (Signed)
Nurse April called prior to pt visit for today to inform Dr Acie Fredrickson that pt was experiencing lower extremity edema since reducing lasix. Denies SOB, no weight gain. Pt is up visiting husband in hospital all day long. Lasix has been reduced due to worsening renal insufficiency and explained that to April RN so she could convey that to the pt,. Told nurse to review her diet, auscultate lungs, assess leg edema, inform her to elevate legs 3 x day, wear TED hose, and call with further questions after assessment. Nurse agreed to plan.

## 2013-01-10 ENCOUNTER — Other Ambulatory Visit (INDEPENDENT_AMBULATORY_CARE_PROVIDER_SITE_OTHER): Payer: Medicare Other

## 2013-01-10 DIAGNOSIS — I5022 Chronic systolic (congestive) heart failure: Secondary | ICD-10-CM

## 2013-01-10 LAB — BASIC METABOLIC PANEL
CO2: 32 mEq/L (ref 19–32)
Calcium: 9.2 mg/dL (ref 8.4–10.5)
Glucose, Bld: 91 mg/dL (ref 70–99)
Sodium: 141 mEq/L (ref 135–145)

## 2013-01-13 ENCOUNTER — Telehealth: Payer: Self-pay | Admitting: Internal Medicine

## 2013-01-13 NOTE — Telephone Encounter (Signed)
New problem    New a verbal order for continue home health physical therapy services. H/o heart failure.

## 2013-01-13 NOTE — Telephone Encounter (Signed)
I will froward to Dr Acie Fredrickson for review. Please advise.

## 2013-01-17 NOTE — Telephone Encounter (Signed)
msg left and asked to call with any additional information.

## 2013-01-17 NOTE — Telephone Encounter (Signed)
I think Mandy Larson is ambulatory and we can DC home health.  She may participate in OP PT if desired.

## 2013-02-07 ENCOUNTER — Other Ambulatory Visit: Payer: Self-pay | Admitting: Internal Medicine

## 2013-02-07 DIAGNOSIS — R7989 Other specified abnormal findings of blood chemistry: Secondary | ICD-10-CM

## 2013-02-09 ENCOUNTER — Ambulatory Visit
Admission: RE | Admit: 2013-02-09 | Discharge: 2013-02-09 | Disposition: A | Discharge status: Home / Self Care | Payer: Medicare Other | Source: Ambulatory Visit | Attending: Internal Medicine | Admitting: Internal Medicine

## 2013-02-09 DIAGNOSIS — R7989 Other specified abnormal findings of blood chemistry: Secondary | ICD-10-CM

## 2013-02-28 ENCOUNTER — Ambulatory Visit (INDEPENDENT_AMBULATORY_CARE_PROVIDER_SITE_OTHER): Payer: Medicare Other | Admitting: Cardiovascular Disease

## 2013-02-28 ENCOUNTER — Encounter: Payer: Self-pay | Admitting: Cardiovascular Disease

## 2013-02-28 ENCOUNTER — Encounter: Payer: Self-pay | Admitting: *Deleted

## 2013-02-28 VITALS — BP 142/76 | HR 56 | Ht 63.0 in | Wt 142.4 lb

## 2013-02-28 DIAGNOSIS — I10 Essential (primary) hypertension: Secondary | ICD-10-CM

## 2013-02-28 LAB — BASIC METABOLIC PANEL
BUN: 31 mg/dL — ABNORMAL HIGH (ref 6–23)
CO2: 31 mEq/L (ref 19–32)
Chloride: 100 mEq/L (ref 96–112)
Creatinine, Ser: 1.7 mg/dL — ABNORMAL HIGH (ref 0.4–1.2)

## 2013-02-28 MED ORDER — ISOSORBIDE MONONITRATE ER 30 MG PO TB24: 30.0000 mg | ORAL_TABLET | Freq: Every day | ORAL | Status: DC

## 2013-02-28 MED ORDER — CARVEDILOL 6.25 MG PO TABS: 6.2500 mg | ORAL_TABLET | Freq: Two times a day (BID) | ORAL | Status: DC

## 2013-02-28 NOTE — Assessment & Plan Note (Signed)
Mandy Larson continues to have some shortness of breath. It became clear that she has not been paying attention to her diet and in fact is eating a lot of salt. We will give her information on the  DASH diet.  She has not been taking her isosorbide. We will be prescribed at. We'll also tolerate the atenolol and carvedilol in different carvedilol 6.25 mg twice a day.  We will call him another prescription for isosorbide.  I will see her in 3 months for ov , BMP

## 2013-02-28 NOTE — Progress Notes (Signed)
Tahjae Turnage Date of Birth  1930-08-08       [facility]    [employer] [address], [address], [place], [place]  [address]   [place], [place]  [address] [phone]     [phone]   Fax  [phone]    Fax [phone]  Problem List: 1. Coronary artery disease-status post myocardial infarction/ACS 2. Acute on Chronic Systolic CHF - though he has a no Left ventricle:  mildly dilated, EF 35% to 40%. akinesis of the entire inferior and inferoseptal myocardium. Grade 2  diastolic dysfunction. - Mitral valve: Calcified annulus. There was likely dysfunction/tethering of the posterior papillary muscle as a result of inferior infarct. Severe regurgitation directed centrally and posteriorly. - Right ventricle: The cavity size was mildly dilated. Wall thickness was normal. - Pulmonic valve: Moderate regurgitation. - Pulmonary arteries: Systolic pressure was moderately increased. PA peak pressure: 22mm Hg (S). 3. Hypertension 4. Mitral regurgitation-severe-likely cause of her inferior wall myocardial infarction 5. Type 2 diabetes mellitus 6.  Hyperlipidemia 7. Chronic kidney disease - baseline Creatinine = 1.72  History of Present Illness: Ms. Seamans is an 79 yo female who was admitted to the hospital in December, 2013.  She was originally seen in consultation by Dr. Aundra Dubin. She presented with an acute coronary syndrome. She was found to have mildly positive cardiac enzymes. An echocardiogram revealed inferior akinesis. She has an ejection fraction of around 35%. She has severe mitral regurgitation. She also has moderate pulmonary hypertension.  We did not perform a cardiac catheterization because of her chronic kidney disease. She did very well in that local therapy and was discharged in satisfactory condition.   She's done very well on medical therapy. She's not had any further episodes of  chest pain or shortness of breath.  February 28, 2013:  She gets short of breath with climbing stairs and other exertion.   She is not avoiding salt    Current Outpatient Prescriptions on File Prior to Visit  Medication Sig Dispense Refill  . aspirin EC 81 MG tablet Take 81 mg by mouth daily.      . carvedilol (COREG) 3.125 MG tablet Take 1 tablet (3.125 mg total) by mouth 2 (two) times daily with a meal.  60 tablet  5  . furosemide (LASIX) 20 MG tablet Take 1 tablet (20 mg total) by mouth daily.  30 tablet  6  . gabapentin (NEURONTIN) 300 MG capsule Take 300 mg by mouth 3 (three) times daily.      Marland Kitchen glipiZIDE (GLUCOTROL) 5 MG tablet Take 1 tablet (5 mg total) by mouth daily.  30 tablet  1  . levothyroxine (SYNTHROID, LEVOTHROID) 50 MCG tablet Take 50 mcg by mouth daily.      . simvastatin (ZOCOR) 20 MG tablet Take 20 mg by mouth every evening.      . isosorbide mononitrate (IMDUR) 30 MG 24 hr tablet Take 1 tablet (30 mg total) by mouth daily.  30 tablet  1   No current facility-administered medications on file prior to visit.    No Known Allergies  Past Medical History  Diagnosis Date  . Diabetes mellitus without complication   . Anginal pain   . Hypertension   . CKD (chronic kidney disease)   . Anemia   . Acute systolic congestive heart failure, NYHA class 2 11/08/2012  . Former smoker   . Hyperlipidemia     Past Surgical History  Procedure Laterality  Date  . Eye surgery      History  Smoking status  . Former Smoker  . Types: Cigarettes  . Quit date: 11/09/1999  Smokeless tobacco  . Not on file    History  Alcohol Use No    No family history on file.  Reviw of Systems:  Reviewed in the HPI.  All other systems are negative.  Physical Exam: Blood pressure 142/76, pulse 56, height 5\' 3"  (1.6 m), weight 142 lb 6.4 oz (64.592 kg), SpO2 96.00%. General: Well developed, well nourished, in no acute distress.  Head: Normocephalic, atraumatic, sclera non-icteric, mucus membranes are moist,  Neck: Supple.  Carotids are 2 + without bruits. Neck veins are flat.  Lungs: Clear   Heart: RR, normal S1, S2, soft systolic murmur  Abdomen: Soft, non-tender, non-distended with normal bowel sounds.  Msk:  Strength and tone are normal   Extremities: No clubbing or cyanosis. No edema.  Distal pedal pulses are 2+ and equal    Neuro: CN II - XII intact.  Alert and oriented X 3.   Psych:  Normal   ECG:   Assessment / Plan:  By your has

## 2013-02-28 NOTE — Patient Instructions (Addendum)
Your physician has recommended you make the following change in your medication:   STOP ATENOLOL INCREASE COREG/ CARVEDILOL FROM 3. 125 MG TO 6.25 MG TWICE DAILY RE-START TAKING IMDUR/ ISOSORBIDE 30 MG  Your physician recommends that you return for lab work in: [place] physician recommends that you schedule a follow-up appointment in: [place] BMET LAB DRAW   [facility] Low Glycemic Diet (Source: [facility], 2006) Low Glycemic Foods (20-49) (Decrease risk of developing heart disease) Breakfast Cereals: All-Bran All-Bran Fruit 'n Oats Fiber One Oatmeal (not instant) Oat bran Fruits and fruit juices: (Limit to 1-2 servings per day) Apples Apricots (fresh & dried) Blackberries Blueberries Cherries Cranberries Peaches Pears Plums Prunes Grapefruit Raspberries Strawberries Tangerine Apple juice Grapefruit juice Tomato juice Beans and legumes (fresh-cooked): Black-eyed peas Butter beans Chick peas Lentils  Green beans Lima beans Kidney beans Navy beans Pinto beans Snow peas Non-starchy vegetables: Asparagus, avocado, broccoli, cabbage, cauliflower, celery, cucumber, greens, lettuce, mushrooms, peppers, tomatoes, okra, onions, spinach, summer squash Grains: Barley Bulgur Rye Wild rice Nuts and oils : Almonds Peanuts Sunflower seeds Hazelnuts Pecans Walnuts Oils that are liquid at room temperature Dairy, fish, meat, soy, and eggs: Milk, skim Lowfat cheese Yogurt, lowfat, fruit sugar sweetened Lean red meat Fish  Skinless chicken & [place] Shellfish Egg whites (up to 3 daily) Soy products  Egg yolks (up to 7 or _____ per week) Moderate Glycemic Foods (50-69) Breakfast Cereals: Bran Buds Bran Chex Just Right Mini-Wheats  Special K Swiss muesli Fruits: Banana (under-ripe) Dates Figs Grapes Kiwi Mango Oranges Raisins Fruit Juices: Cranberry juice Orange juice Beans and legumes: Boston-type baked beans Canned pinto, kidney, or  navy beans Green peas Vegetables: Beets Carrots  Sweet potato Yam Corn on the cob Breads: Pita (pocket) bread Oat bran bread Pumpernickel bread Rye bread Wheat bread, high fiber  Grains: Cornmeal Rice, brown Rice, white Couscous Pasta: Macaroni Pizza, cheese Ravioli, meat filled Spaghetti, white  Nuts: Cashews Macadamia Snacks: Chocolate Ice cream, lowfat Muffin Popcorn High Glycemic Foods (70-100)  Breakfast Cereals: Cheerios Corn Chex Corn Flakes Cream of Wheat Grape Nuts Grape Nut Flakes Grits Nutri-Grain Puffed Rice Puffed Wheat Rice Chex Rice Krispies Shredded Wheat Team Total Fruits: Pineapple Watermelon Banana (over-ripe) Beverages: Sodas, sweet tea, pineapple juice Vegetables: Potato, baked, boiled, fried, mashed [place] fries Canned or frozen corn Parsnips Winter squash Breads: Most breads (white and whole grain) Bagels Bread sticks Bread stuffing Kaiser roll Dinner rolls Grains: Rice, instant Tapioca, with milk Candy and most cookies Snacks: Donuts Corn chips Jelly beans Pretzels Pastries

## 2013-05-23 ENCOUNTER — Encounter: Payer: Self-pay | Admitting: Cardiovascular Disease

## 2013-05-23 ENCOUNTER — Ambulatory Visit (INDEPENDENT_AMBULATORY_CARE_PROVIDER_SITE_OTHER): Payer: Medicare Other | Admitting: Cardiovascular Disease

## 2013-05-23 VITALS — BP 130/66 | HR 64 | Ht 63.0 in | Wt 132.0 lb

## 2013-05-23 DIAGNOSIS — I10 Essential (primary) hypertension: Secondary | ICD-10-CM

## 2013-05-23 DIAGNOSIS — I509 Heart failure, unspecified: Secondary | ICD-10-CM

## 2013-05-23 DIAGNOSIS — I5043 Acute on chronic combined systolic (congestive) and diastolic (congestive) heart failure: Secondary | ICD-10-CM

## 2013-05-23 DIAGNOSIS — I5031 Acute diastolic (congestive) heart failure: Secondary | ICD-10-CM

## 2013-05-23 LAB — BASIC METABOLIC PANEL
BUN: 35 mg/dL — ABNORMAL HIGH (ref 6–23)
Calcium: 9.8 mg/dL (ref 8.4–10.5)
Creatinine, Ser: 1.9 mg/dL — ABNORMAL HIGH (ref 0.4–1.2)
GFR: 32.79 mL/min — ABNORMAL LOW (ref >60.00)
Glucose, Bld: 67 mg/dL — ABNORMAL LOW (ref 70–99)
Sodium: 140 mEq/L (ref 135–145)

## 2013-05-23 NOTE — Progress Notes (Signed)
Mandy Larson Date of Birth  1930/08/08       [facility]    [employer] [address], [place], [place]  [address]   [place],   [address] [phone]     [phone]   Fax  [phone]    Fax [phone]  Problem List: 1. Coronary artery disease-status post myocardial infarction/ACS 2. Acute on Chronic Systolic CHF - though he has a no Left ventricle:  mildly dilated, EF 35% to 40%. akinesis of the entire inferior and inferoseptal myocardium. Grade 2  diastolic dysfunction. - Mitral valve: Calcified annulus. There was likely dysfunction/tethering of the posterior papillary muscle as a result of inferior infarct. Severe regurgitation directed centrally and posteriorly. - Right ventricle: The cavity size was mildly dilated. Wall thickness was normal. - Pulmonic valve: Moderate regurgitation. - Pulmonary arteries: Systolic pressure was moderately increased. PA peak pressure: 109mm Hg (S). 3. Hypertension 4. Mitral regurgitation-severe-likely cause of her inferior wall myocardial infarction 5. Type 2 diabetes mellitus 6.  Hyperlipidemia 7. Chronic kidney disease - baseline Creatinine = 1.72  History of Present Illness: Mandy Larson is an 79 yo female who was admitted to the hospital in December, 2013.  She was originally seen in consultation by Dr. Aundra Dubin. She presented with an acute coronary syndrome. She was found to have mildly positive cardiac enzymes. An echocardiogram revealed inferior akinesis. She has an ejection fraction of around 35%. She has severe mitral regurgitation. She also has moderate pulmonary hypertension.  We did not perform a cardiac catheterization because of her chronic kidney disease. She did very well in that local therapy and was discharged in satisfactory condition.   She's done very well on medical therapy. She's not had any further episodes of  chest pain or shortness of breath.  February 28, 2013:  She gets short of breath with climbing stairs and other exertion.   She is not avoiding salt   May 23, 2013:  Mandy Larson is doing better - trying to avoid in the extra salt.   Current Outpatient Prescriptions on File Prior to Visit  Medication Sig Dispense Refill  . amLODipine (NORVASC) 10 MG tablet Take 10 mg by mouth daily.      Marland Kitchen aspirin EC 81 MG tablet Take 81 mg by mouth daily.      . carvedilol (COREG) 6.25 MG tablet Take 1 tablet (6.25 mg total) by mouth 2 (two) times daily with a meal.  60 tablet  5  . furosemide (LASIX) 20 MG tablet Take 1 tablet (20 mg total) by mouth daily.  30 tablet  6  . gabapentin (NEURONTIN) 300 MG capsule Take 300 mg by mouth 3 (three) times daily.      Marland Kitchen glipiZIDE (GLUCOTROL) 5 MG tablet Take 1 tablet (5 mg total) by mouth daily.  30 tablet  1  . isosorbide mononitrate (IMDUR) 30 MG 24 hr tablet Take 1 tablet (30 mg total) by mouth daily.  30 tablet  5  . levothyroxine (SYNTHROID, LEVOTHROID) 50 MCG tablet Take 50 mcg by mouth daily.      . simvastatin (ZOCOR) 20 MG tablet Take 20 mg by mouth every evening.       No current facility-administered medications on file prior to visit.    No Known Allergies  Past Medical History  Diagnosis Date  . Diabetes mellitus without complication   . Anginal pain   . Hypertension   . CKD (chronic kidney  disease)   . Anemia   . Acute systolic congestive heart failure, NYHA class 2 11/08/2012  . Former smoker   . Hyperlipidemia     Past Surgical History  Procedure Laterality Date  . Eye surgery      History  Smoking status  . Former Smoker  . Types: Cigarettes  . Quit date: 11/09/1999  Smokeless tobacco  . Not on file    History  Alcohol Use No    No family history on file.  Reviw of Systems:  Reviewed in the HPI.  All other systems are negative.  Physical Exam: Blood pressure 130/66, pulse 64, height 5\' 3"  (1.6 m), weight 132 lb (59.875 kg). General: Well developed, well  nourished, in no acute distress.  Head: Normocephalic, atraumatic, sclera non-icteric, mucus membranes are moist,  Neck: Supple. Carotids are 2 + without bruits. Neck veins are flat.  Lungs: Clear   Heart: RR, normal S1, S2, soft systolic murmur  Abdomen: Soft, non-tender, non-distended with normal bowel sounds.  Msk:  Strength and tone are normal   Extremities: No clubbing or cyanosis. No edema.  Distal pedal pulses are 2+ and equal    Neuro: CN II - XII intact.  Alert and oriented X 3.   Psych:  Normal   ECG: 05/23/2013: Sinus bradycardia at 59. She has a right bundle branch block with left anterior fascicular block. She has nonspecific T-wave abnormality.  Assessment / Plan:

## 2013-05-23 NOTE — Patient Instructions (Addendum)
Your physician recommends that you return for lab work in: today bmet  Your physician wants you to follow-up in: 6 months  You will receive a reminder letter in the mail two months in advance. If you don't receive a letter, please call our office to schedule the follow-up appointment.

## 2013-05-23 NOTE — Assessment & Plan Note (Signed)
Mandy Larson is doing well from a cardiac standpoint.  She is breathing OK.  She needs to continue to avoid salt.   Will check a BMP today.

## 2013-11-01 ENCOUNTER — Ambulatory Visit (INDEPENDENT_AMBULATORY_CARE_PROVIDER_SITE_OTHER): Payer: Medicare Other | Admitting: Podiatry

## 2013-11-01 ENCOUNTER — Encounter: Payer: Self-pay | Admitting: Podiatry

## 2013-11-01 VITALS — BP 122/65 | HR 65 | Resp 16 | Ht 63.0 in | Wt 157.0 lb

## 2013-11-01 DIAGNOSIS — B351 Tinea unguium: Secondary | ICD-10-CM

## 2013-11-01 DIAGNOSIS — M79609 Pain in unspecified limb: Secondary | ICD-10-CM

## 2013-11-01 DIAGNOSIS — M79603 Pain in arm, unspecified: Secondary | ICD-10-CM

## 2013-11-01 NOTE — Progress Notes (Signed)
She presents today with a chief complaint of painful elongated toenails with sharp incurvated nail margins.  Objective: Pulses are strongly palpable bilateral. Nails are thick yellow dystrophic onychomycotic a sharp incurvated nail margins.  Assessment: Pain in limb secondary to onychomycosis 1 through 5 bilateral.  Plan: Debridement of nails 1 through 5 bilateral.

## 2013-12-22 ENCOUNTER — Encounter: Payer: Self-pay | Admitting: Cardiovascular Disease

## 2013-12-22 ENCOUNTER — Encounter (INDEPENDENT_AMBULATORY_CARE_PROVIDER_SITE_OTHER): Payer: Self-pay

## 2013-12-22 ENCOUNTER — Ambulatory Visit (INDEPENDENT_AMBULATORY_CARE_PROVIDER_SITE_OTHER): Payer: Medicare HMO | Admitting: Cardiovascular Disease

## 2013-12-22 VITALS — BP 118/60 | HR 64 | Ht 63.0 in | Wt 149.8 lb

## 2013-12-22 DIAGNOSIS — N289 Disorder of kidney and ureter, unspecified: Secondary | ICD-10-CM

## 2013-12-22 DIAGNOSIS — I509 Heart failure, unspecified: Secondary | ICD-10-CM

## 2013-12-22 DIAGNOSIS — I10 Essential (primary) hypertension: Secondary | ICD-10-CM

## 2013-12-22 DIAGNOSIS — I5031 Acute diastolic (congestive) heart failure: Secondary | ICD-10-CM

## 2013-12-22 DIAGNOSIS — I5043 Acute on chronic combined systolic (congestive) and diastolic (congestive) heart failure: Secondary | ICD-10-CM

## 2013-12-22 LAB — LIPID PANEL
Cholesterol: 126 mg/dL (ref 0–200)
HDL: 79.6 mg/dL (ref >39.00)
LDL Cholesterol: 42 mg/dL (ref 0–99)
TRIGLYCERIDES: 24 mg/dL (ref 0.0–149.0)
Total CHOL/HDL Ratio: 2
VLDL: 4.8 mg/dL (ref 0.0–40.0)

## 2013-12-22 LAB — HEPATIC FUNCTION PANEL
ALK PHOS: 51 U/L (ref 39–117)
ALT: 17 U/L (ref 0–35)
AST: 32 U/L (ref 0–37)
Albumin: 4 g/dL (ref 3.5–5.2)
BILIRUBIN TOTAL: 0.7 mg/dL (ref 0.3–1.2)
Bilirubin, Direct: 0.1 mg/dL (ref 0.0–0.3)
Total Protein: 7.8 g/dL (ref 6.0–8.3)

## 2013-12-22 LAB — BASIC METABOLIC PANEL
BUN: 36 mg/dL — ABNORMAL HIGH (ref 6–23)
CO2: 31 meq/L (ref 19–32)
CREATININE: 2.3 mg/dL — AB (ref 0.4–1.2)
Calcium: 10.3 mg/dL (ref 8.4–10.5)
Chloride: 98 mEq/L (ref 96–112)
GFR: 25.59 mL/min — AB (ref >60.00)
Glucose, Bld: 145 mg/dL — ABNORMAL HIGH (ref 70–99)
Potassium: 4.1 mEq/L (ref 3.5–5.1)
Sodium: 138 mEq/L (ref 135–145)

## 2013-12-22 NOTE — Progress Notes (Signed)
Mandy Larson Date of Birth  02/26/1930       [facility]    [employer] [address], [address], [place], [place]  [address]   [place], [place]  [address] [phone]     [phone]   Fax  [phone]    Fax [phone]  Problem List: 1. Coronary artery disease-status post myocardial infarction/ACS 2. Acute on Chronic Systolic CHF -   Left ventricle:  mildly dilated, EF 35% to 40%. akinesis of the entire inferior and inferoseptal myocardium. Grade 2  diastolic dysfunction. - Mitral valve: Calcified annulus. There was likely dysfunction/tethering of the posterior papillary muscle as a result of inferior infarct. Severe regurgitation directed centrally and posteriorly. - Right ventricle: The cavity size was mildly dilated. Wall thickness was normal. - Pulmonic valve: Moderate regurgitation. - Pulmonary arteries: Systolic pressure was moderately increased. PA peak pressure: 64mm Hg (S). 3. Hypertension 4. Mitral regurgitation-severe-likely due to  inferior wall myocardial infarction 5. Type 2 diabetes mellitus 6.  Hyperlipidemia 7. Chronic kidney disease - baseline Creatinine = 1.72  History of Present Illness: Mandy Larson is an 80 yo female who was admitted to the hospital in December, 2013.  She was originally seen in consultation by Dr. Aundra Dubin. She presented with an acute coronary syndrome. She was found to have mildly positive cardiac enzymes. An echocardiogram revealed inferior akinesis. She has an ejection fraction of around 35%. She has severe mitral regurgitation. She also has moderate pulmonary hypertension.  We did not perform a cardiac catheterization because of her chronic kidney disease. She did very well in that local therapy and was discharged in satisfactory condition. She's done very well on medical therapy. She's not had any further episodes of  chest pain or shortness of breath.  February 28, 2013: She gets short of  breath with climbing stairs and other exertion.   She is not avoiding salt   May 23, 2013: Mandy Larson is doing better - trying to avoid in the extra salt.  Feb. 12, 2015:  Mandy Larson is doing great.  She has been able to do all of her normal activities without much difficulty.  No CP , no dyspnea.  She walks around in her 3 story house.  She is taking care of her sick husband which takes up most of her time.  She's able to go grocery shopping without too much difficulty.  Current Outpatient Prescriptions on File Prior to Visit  Medication Sig Dispense Refill  . amLODipine (NORVASC) 10 MG tablet Take 10 mg by mouth daily.      Marland Kitchen aspirin EC 81 MG tablet Take 81 mg by mouth daily.      . carvedilol (COREG) 6.25 MG tablet Take 1 tablet (6.25 mg total) by mouth 2 (two) times daily with a meal.  60 tablet  5  . furosemide (LASIX) 20 MG tablet Take 1 tablet (20 mg total) by mouth daily.  30 tablet  6  . gabapentin (NEURONTIN) 300 MG capsule Take 300 mg by mouth 3 (three) times daily.      Marland Kitchen glipiZIDE (GLUCOTROL) 5 MG tablet Take 1 tablet (5 mg total) by mouth daily.  30 tablet  1  . isosorbide mononitrate (IMDUR) 30 MG 24 hr tablet Take 1 tablet (30 mg total) by mouth daily.  30 tablet  5  . levothyroxine (SYNTHROID, LEVOTHROID) 50 MCG tablet Take 50 mcg by mouth daily.      . simvastatin (ZOCOR)  20 MG tablet Take 20 mg by mouth every evening.       No current facility-administered medications on file prior to visit.    No Known Allergies  Past Medical History  Diagnosis Date  . Diabetes mellitus without complication   . Anginal pain   . Hypertension   . CKD (chronic kidney disease)   . Anemia   . Acute systolic congestive heart failure, NYHA class 2 11/08/2012  . Former smoker   . Hyperlipidemia     Past Surgical History  Procedure Laterality Date  . Eye surgery      History  Smoking status  . Former Smoker  . Types: Cigarettes  . Quit date: 11/09/1999  Smokeless tobacco  .  Not on file    History  Alcohol Use No    No family history on file.  Reviw of Systems:  Reviewed in the HPI.  All other systems are negative.  Physical Exam: Blood pressure 118/60, pulse 64, height 5\' 3"  (1.6 m), weight 149 lb 12.8 oz (67.949 kg). General: Well developed, well nourished, in no acute distress.  Head: Normocephalic, atraumatic, sclera non-icteric, mucus membranes are moist,  Neck: Supple. Carotids are 2 + without bruits. Neck veins are flat.  Lungs: Clear   Heart: RR, normal S1, S2, soft systolic murmur  Abdomen: Soft, non-tender, non-distended with normal bowel sounds.  Msk:  Strength and tone are normal   Extremities: No clubbing or cyanosis. No edema.  Distal pedal pulses are 2+ and equal    Neuro: CN II - XII intact.  Alert and oriented X 3.   Psych:  Normal   ECG:  Assessment / Plan:

## 2013-12-22 NOTE — Patient Instructions (Signed)
Your physician wants you to follow-up in: Mandy Larson will receive a reminder letter in the mail two months in advance. If you don't receive a letter, please call our office to schedule the follow-up appointment.   Your physician recommends that you HAVE LAB WORK TODAY

## 2013-12-22 NOTE — Assessment & Plan Note (Addendum)
Mandy Larson is doing well.  She is asymptomatic .  Will continue with her current meds.  I have encouraged her to watch her salt.  Will check lags ( lipids, liver, bMP) today.  Her BMP reveals a creatinine of 2.3 .  This is slightly higher that her previous levels.  She will need to follow up with her medical doctor.   I'll see her in 6 months.

## 2013-12-22 NOTE — Assessment & Plan Note (Signed)
Will have her watch her salt. Cont. Same meds

## 2014-01-31 ENCOUNTER — Ambulatory Visit: Payer: Medicare Other | Admitting: Podiatry

## 2014-02-16 ENCOUNTER — Ambulatory Visit: Payer: Medicare Other | Admitting: Podiatry

## 2014-02-17 ENCOUNTER — Encounter: Payer: Self-pay | Admitting: Podiatry

## 2014-02-17 ENCOUNTER — Ambulatory Visit (INDEPENDENT_AMBULATORY_CARE_PROVIDER_SITE_OTHER): Payer: Medicare HMO | Admitting: Podiatry

## 2014-02-17 VITALS — BP 154/86 | HR 69 | Resp 12

## 2014-02-17 DIAGNOSIS — B351 Tinea unguium: Secondary | ICD-10-CM

## 2014-02-17 DIAGNOSIS — M79609 Pain in unspecified limb: Secondary | ICD-10-CM

## 2014-02-17 NOTE — Progress Notes (Signed)
She presents today chief complaint of painful toenails bilateral.  Objective: Pulses are palpable nails are thick yellow dystrophic onychomycotic and painful palpation as well as debridement.  Assessment: Pain in limb secondary to onychomycosis 1-5 bilateral.  Plan: Debridement of nails 1-5 bilateral.

## 2014-05-25 ENCOUNTER — Ambulatory Visit (INDEPENDENT_AMBULATORY_CARE_PROVIDER_SITE_OTHER): Payer: Medicare HMO | Admitting: Podiatry

## 2014-05-25 ENCOUNTER — Encounter: Payer: Self-pay | Admitting: Podiatry

## 2014-05-25 DIAGNOSIS — B351 Tinea unguium: Secondary | ICD-10-CM

## 2014-05-25 DIAGNOSIS — M79676 Pain in unspecified toe(s): Secondary | ICD-10-CM

## 2014-05-25 DIAGNOSIS — M79609 Pain in unspecified limb: Secondary | ICD-10-CM

## 2014-05-25 NOTE — Progress Notes (Signed)
She presents today with a chief complaint of painful elongated toenails.  Objective: Pulses are palpable bilateral. Nails are thick yellow dystrophic onychomycotic and painful palpation.  Assessment: Pain in limb secondary to onychomycosis.  Plan: Debridement of nails in thickness and length as cover service secondary to pain.

## 2014-06-26 ENCOUNTER — Ambulatory Visit (INDEPENDENT_AMBULATORY_CARE_PROVIDER_SITE_OTHER): Payer: Medicare HMO | Admitting: Cardiovascular Disease

## 2014-06-26 ENCOUNTER — Encounter: Payer: Self-pay | Admitting: Cardiovascular Disease

## 2014-06-26 VITALS — BP 128/63 | HR 65 | Ht 63.0 in | Wt 153.8 lb

## 2014-06-26 DIAGNOSIS — I5043 Acute on chronic combined systolic (congestive) and diastolic (congestive) heart failure: Secondary | ICD-10-CM

## 2014-06-26 DIAGNOSIS — I509 Heart failure, unspecified: Secondary | ICD-10-CM

## 2014-06-26 DIAGNOSIS — I2510 Atherosclerotic heart disease of native coronary artery without angina pectoris: Secondary | ICD-10-CM | POA: Insufficient documentation

## 2014-06-26 DIAGNOSIS — N289 Disorder of kidney and ureter, unspecified: Secondary | ICD-10-CM

## 2014-06-26 DIAGNOSIS — I10 Essential (primary) hypertension: Secondary | ICD-10-CM

## 2014-06-26 HISTORY — DX: Atherosclerotic heart disease of native coronary artery without angina pectoris: I25.10

## 2014-06-26 LAB — BASIC METABOLIC PANEL
BUN: 37 mg/dL — ABNORMAL HIGH (ref 6–23)
CO2: 30 meq/L (ref 19–32)
CREATININE: 2.5 mg/dL — AB (ref 0.4–1.2)
Calcium: 9.3 mg/dL (ref 8.4–10.5)
Chloride: 101 mEq/L (ref 96–112)
GFR: 23.36 mL/min — ABNORMAL LOW (ref >60.00)
GLUCOSE: 108 mg/dL — AB (ref 70–99)
Potassium: 4.4 mEq/L (ref 3.5–5.1)
Sodium: 140 mEq/L (ref 135–145)

## 2014-06-26 NOTE — Progress Notes (Addendum)
Mandy Larson Date of Birth  January 19, 1930       [facility]    [employer] [address], [place], [place]  [address]   [place], [place]  [address] [phone]     [phone]   Fax  [phone]    Fax [phone]  Problem List: 1. Coronary artery disease-status post myocardial infarction/ACS 2. Acute on Chronic Systolic CHF -   Left ventricle:  mildly dilated, EF 35% to 40%. akinesis of the entire inferior and inferoseptal myocardium. Grade 2  diastolic dysfunction. - Mitral valve: Calcified annulus. There was likely dysfunction/tethering of the posterior papillary muscle as a result of inferior infarct. Severe regurgitation directed centrally and posteriorly. - Right ventricle: The cavity size was mildly dilated. Wall thickness was normal. - Pulmonic valve: Moderate regurgitation. - Pulmonary arteries: Systolic pressure was moderately increased. PA peak pressure: 48mm Hg (S). 3. Hypertension 4. Mitral regurgitation-severe-likely due to  inferior wall myocardial infarction 5. Type 2 diabetes mellitus 6.  Hyperlipidemia 7. Chronic kidney disease - baseline Creatinine = 2.0-2.5  History of Present Illness: Mandy Larson is an 80 yo female who was admitted to the hospital in December, 2013.  She was originally seen in consultation by Dr. Aundra Dubin. She presented with an acute coronary syndrome. She was found to have mildly positive cardiac enzymes. An echocardiogram revealed inferior akinesis. She has an ejection fraction of around 35%. She has severe mitral regurgitation. She also has moderate pulmonary hypertension.  We did not perform a cardiac catheterization because of her chronic kidney disease. She did very well in that local therapy and was discharged in satisfactory condition. She's done very well on medical therapy. She's not had any further episodes of  chest pain or shortness of breath.  February 28, 2013: She gets short of  breath with climbing stairs and other exertion.   She is not avoiding salt   May 23, 2013: Mandy Larson is doing better - trying to avoid in the extra salt.  Feb. 12, 2015:  Mandy Larson is doing great.  She has been able to do all of her normal activities without much difficulty.  No CP , no dyspnea.  She walks around in her 3 story house.  She is taking care of her sick husband which takes up most of her time.  She's able to go grocery shopping without too much difficulty.  June 26, 2014: No complaints. No real exercise, busy keeping house     Current Outpatient Prescriptions on File Prior to Visit  Medication Sig Dispense Refill  . amLODipine (NORVASC) 10 MG tablet Take 10 mg by mouth daily.      Marland Kitchen aspirin EC 81 MG tablet Take 81 mg by mouth daily.      . carvedilol (COREG) 6.25 MG tablet Take 1 tablet (6.25 mg total) by mouth 2 (two) times daily with a meal.  60 tablet  5  . furosemide (LASIX) 20 MG tablet Take 1 tablet (20 mg total) by mouth daily.  30 tablet  6  . glipiZIDE (GLUCOTROL) 5 MG tablet Take 1 tablet (5 mg total) by mouth daily.  30 tablet  1  . isosorbide mononitrate (IMDUR) 30 MG 24 hr tablet Take 1 tablet (30 mg total) by mouth daily.  30 tablet  5  . levothyroxine (SYNTHROID, LEVOTHROID) 50 MCG tablet Take 50 mcg by mouth daily.      . simvastatin (ZOCOR) 20 MG tablet Take 20  mg by mouth every evening.      . traMADol (ULTRAM) 50 MG tablet        No current facility-administered medications on file prior to visit.    No Known Allergies  Past Medical History  Diagnosis Date  . Diabetes mellitus without complication   . Anginal pain   . Hypertension   . CKD (chronic kidney disease)   . Anemia   . Acute systolic congestive heart failure, NYHA class 2 11/08/2012  . Former smoker   . Hyperlipidemia     Past Surgical History  Procedure Laterality Date  . Eye surgery      History  Smoking status  . Former Smoker  . Types: Cigarettes  . Quit date:  11/09/1999  Smokeless tobacco  . Not on file    History  Alcohol Use No    History reviewed. No pertinent family history.  Reviw of Systems:  Reviewed in the HPI.  All other systems are negative.  Physical Exam: Blood pressure 128/63, pulse 65, height 5\' 3"  (1.6 m), weight 153 lb 12.8 oz (69.763 kg). General: Well developed, well nourished, in no acute distress.  Head: Normocephalic, atraumatic, sclera non-icteric, mucus membranes are moist,  Neck: Supple. Carotids are 2 + without bruits. Neck veins are flat.  Lungs: Clear   Heart: RR, normal S1, S2, soft systolic murmur  Abdomen: Soft, non-tender, non-distended with normal bowel sounds.  Msk:  Strength and tone are normal  Extremities: No clubbing or cyanosis. No edema.  Distal pedal pulses are 2+ and equal   Neuro: CN II - XII intact.  Alert and oriented X 3.   Psych:  Normal   ECG: 06/26/2014: Normal sinus rhythm with occasional PACs. She has a right bundle branch block.  No significant changes from previous EKG. Assessment / Plan:

## 2014-06-26 NOTE — Patient Instructions (Addendum)
Your physician recommends that you continue on your current medications as directed. Please refer to the Current Medication list given to you today.  Your physician recommends that you go to the lab today for a BMET  Your physician wants you to follow-up in: 1 year with Dr. Vilinda Boehringer will receive a reminder letter in the mail two months in advance. If you don't receive a letter, please call our office to schedule the follow-up appointment.

## 2014-06-26 NOTE — Assessment & Plan Note (Addendum)
She's doing fairly well from a CHF standpoint. She does have some mouth sores but is able to do all of her normal duties without significant problems but she does not get a lot of right exercise.  She complains of having to go to the bathroom frequently at night she admits to drinking lots of sodas with caffiene  especially in the evening. Will have her cut out her soft drinks.  Will check BMP today.  I will see her  in one year for office visit, basic metabolic profile, fasting lipids, liver enzymes.

## 2014-06-26 NOTE — Assessment & Plan Note (Signed)
Mandy Larson is doing well. She's not having episodes of chest pain. Continue with same meds

## 2014-09-05 ENCOUNTER — Encounter: Payer: Self-pay | Admitting: Podiatry

## 2014-09-05 ENCOUNTER — Ambulatory Visit (INDEPENDENT_AMBULATORY_CARE_PROVIDER_SITE_OTHER): Payer: Medicare HMO | Admitting: Podiatry

## 2014-09-05 DIAGNOSIS — M79676 Pain in unspecified toe(s): Secondary | ICD-10-CM

## 2014-09-05 DIAGNOSIS — B351 Tinea unguium: Secondary | ICD-10-CM

## 2014-09-05 NOTE — Progress Notes (Signed)
Presents today chief complaint of painful elongated toenails.  Objective: Pulses are palpable bilateral nails are thick, yellow dystrophic onychomycosis and painful palpation.   Assessment: Onychomycosis with pain in limb.  Plan: Treatment of nails in thickness and length as covered service secondary to pain.  

## 2014-09-28 ENCOUNTER — Other Ambulatory Visit (INDEPENDENT_AMBULATORY_CARE_PROVIDER_SITE_OTHER): Payer: Medicare HMO | Admitting: *Deleted

## 2014-09-28 DIAGNOSIS — N289 Disorder of kidney and ureter, unspecified: Secondary | ICD-10-CM

## 2014-09-28 DIAGNOSIS — I5043 Acute on chronic combined systolic (congestive) and diastolic (congestive) heart failure: Secondary | ICD-10-CM

## 2014-09-28 LAB — BASIC METABOLIC PANEL
BUN: 40 mg/dL — AB (ref 6–23)
CO2: 30 mEq/L (ref 19–32)
CREATININE: 2.1 mg/dL — AB (ref 0.4–1.2)
Calcium: 9.3 mg/dL (ref 8.4–10.5)
Chloride: 102 mEq/L (ref 96–112)
GFR: 29.26 mL/min — AB (ref >60.00)
Glucose, Bld: 120 mg/dL — ABNORMAL HIGH (ref 70–99)
Potassium: 4 mEq/L (ref 3.5–5.1)
Sodium: 140 mEq/L (ref 135–145)

## 2014-09-28 LAB — HEPATIC FUNCTION PANEL
ALK PHOS: 52 U/L (ref 39–117)
ALT: 15 U/L (ref 0–35)
AST: 33 U/L (ref 0–37)
Albumin: 4 g/dL (ref 3.5–5.2)
Bilirubin, Direct: 0 mg/dL (ref 0.0–0.3)
TOTAL PROTEIN: 7.5 g/dL (ref 6.0–8.3)
Total Bilirubin: 0.5 mg/dL (ref 0.2–1.2)

## 2014-09-28 LAB — LIPID PANEL
Cholesterol: 149 mg/dL (ref 0–200)
HDL: 90.3 mg/dL (ref >39.00)
LDL CALC: 53 mg/dL (ref 0–99)
NonHDL: 58.7
TRIGLYCERIDES: 29 mg/dL (ref 0.0–149.0)
Total CHOL/HDL Ratio: 2
VLDL: 5.8 mg/dL (ref 0.0–40.0)

## 2014-12-12 ENCOUNTER — Ambulatory Visit: Payer: Medicare HMO | Admitting: Podiatry

## 2014-12-14 ENCOUNTER — Telehealth: Payer: Self-pay | Admitting: Cardiovascular Disease

## 2014-12-14 NOTE — Telephone Encounter (Signed)
Received request from Nurse fax box:   To: [facility] [employer] Fax number: [phone]

## 2014-12-29 ENCOUNTER — Encounter: Payer: Self-pay | Admitting: Podiatrist

## 2014-12-29 ENCOUNTER — Ambulatory Visit (INDEPENDENT_AMBULATORY_CARE_PROVIDER_SITE_OTHER): Payer: Medicare Other | Admitting: Podiatrist

## 2014-12-29 DIAGNOSIS — M79676 Pain in unspecified toe(s): Secondary | ICD-10-CM

## 2014-12-29 DIAGNOSIS — B351 Tinea unguium: Secondary | ICD-10-CM

## 2014-12-29 NOTE — Progress Notes (Signed)
HPI: Patient presents today for follow up of diabetic foot and nail care. Past medical history, meds, and allergies reviewed. Patient states blood sugar is under good  control.   Objective:   Objective:  Patients chart is reviewed.  Vascular status reveals pedal pulses noted at  1 out of 4 dp and pt bilateral .  Neurological sensation is unchanged. Toenails are elongated, incurvated, discolored, dystrophic with ingrown deformity present.    Assessment: Diabetes , symptomatic mycotic toenails.  Plan: Discussed treatment options and alternatives. Debrided nails without complication.  Return appointment recommended at routine intervals of 3 months.

## 2014-12-29 NOTE — Patient Instructions (Signed)
Diabetes and Foot Care Diabetes may cause you to have problems because of poor blood supply (circulation) to your feet and legs. This may cause the skin on your feet to become thinner, break easier, and heal more slowly. Your skin may become dry, and the skin may peel and crack. You may also have nerve damage in your legs and feet causing decreased feeling in them. You may not notice minor injuries to your feet that could lead to infections or more serious problems. Taking care of your feet is one of the most important things you can do for yourself.  HOME CARE INSTRUCTIONS  Wear shoes at all times, even in the house. Do not go barefoot. Bare feet are easily injured.  Check your feet daily for blisters, cuts, and redness. If you cannot see the bottom of your feet, use a mirror or ask someone for help.  Wash your feet with warm water (do not use hot water) and mild soap. Then pat your feet and the areas between your toes until they are completely dry. Do not soak your feet as this can dry your skin.  Apply a moisturizing lotion or petroleum jelly (that does not contain alcohol and is unscented) to the skin on your feet and to dry, brittle toenails. Do not apply lotion between your toes.  Trim your toenails straight across. Do not dig under them or around the cuticle. File the edges of your nails with an emery board or nail file.  Do not cut corns or calluses or try to remove them with medicine.  Wear clean socks or stockings every day. Make sure they are not too tight. Do not wear knee-high stockings since they may decrease blood flow to your legs.  Wear shoes that fit properly and have enough cushioning. To break in new shoes, wear them for just a few hours a day. This prevents you from injuring your feet. Always look in your shoes before you put them on to be sure there are no objects inside.  Do not cross your legs. This may decrease the blood flow to your feet.  If you find a minor scrape,  cut, or break in the skin on your feet, keep it and the skin around it clean and dry. These areas may be cleansed with mild soap and water. Do not cleanse the area with peroxide, alcohol, or iodine.  When you remove an adhesive bandage, be sure not to damage the skin around it.  If you have a wound, look at it several times a day to make sure it is healing.  Do not use heating pads or hot water bottles. They may burn your skin. If you have lost feeling in your feet or legs, you may not know it is happening until it is too late.  Make sure your health care provider performs a complete foot exam at least annually or more often if you have foot problems. Report any cuts, sores, or bruises to your health care provider immediately. SEEK MEDICAL CARE IF:   You have an injury that is not healing.  You have cuts or breaks in the skin.  You have an ingrown nail.  You notice redness on your legs or feet.  You feel burning or tingling in your legs or feet.  You have pain or cramps in your legs and feet.  Your legs or feet are numb.  Your feet always feel cold. SEEK IMMEDIATE MEDICAL CARE IF:   There is increasing redness,   swelling, or pain in or around a wound.  There is a red line that goes up your leg.  Pus is coming from a wound.  You develop a fever or as directed by your health care provider.  You notice a bad smell coming from an ulcer or wound. Document Released: 10/24/2000 Document Revised: 06/29/2013 Document Reviewed: 04/05/2013 [employer] Patient Information 2015 [employer]. This information is not intended to replace advice given to you by your health care provider. Make sure you discuss any questions you have with your health care provider.  

## 2015-04-05 ENCOUNTER — Ambulatory Visit (INDEPENDENT_AMBULATORY_CARE_PROVIDER_SITE_OTHER): Payer: Medicare Other

## 2015-04-05 DIAGNOSIS — M79676 Pain in unspecified toe(s): Secondary | ICD-10-CM

## 2015-04-05 DIAGNOSIS — B351 Tinea unguium: Secondary | ICD-10-CM | POA: Diagnosis not present

## 2015-04-05 DIAGNOSIS — L84 Corns and callosities: Secondary | ICD-10-CM

## 2015-04-05 DIAGNOSIS — Q828 Other specified congenital malformations of skin: Secondary | ICD-10-CM

## 2015-04-05 NOTE — Progress Notes (Signed)
Patient ID: Mandy Larson, female   DOB: [birth year], 81 y.o.   MRN: [id number] Complaint:  Visit Type: Patient returns to my office for continued preventative foot care services. Complaint: Patient states" my nails have grown long and thick and become painful to walk and wear shoes" Patient has been diagnosed with DM with no complications. He presents for preventative foot care services. No changes to ROS.  Painful callus under left forefoot.  Podiatric Exam: Vascular: dorsalis pedis and posterior tibial pulses are palpable bilateral. Capillary return is immediate. Temperature gradient is WNL. Skin turgor WNL  Sensorium: Normal Semmes Weinstein monofilament test. Normal tactile sensation bilaterally. Nail Exam: Pt has thick disfigured discolored nails with subungual debris noted bilateral entire nail hallux through fifth toenails Ulcer Exam: There is no evidence of ulcer or pre-ulcerative changes or infection. Orthopedic Exam: Muscle tone and strength are WNL. No limitations in general ROM. No crepitus or effusions noted. Foot type and digits show no abnormalities. Bony prominences are unremarkable. Skin:    Porokeratosis sub2 left foot No infection or ulcers  Diagnosis:  Tinea unguium, Pain in right toe, pain in left toes  Treatment & Plan Procedures and Treatment: Consent by patient was obtained for treatment procedures. The patient understood the discussion of treatment and procedures well. All questions were answered thoroughly reviewed. Debridement of mycotic and hypertrophic toenails, 1 through 5 bilateral and clearing of subungual debris. No ulceration, no infection noted.  Return Visit-Office Procedure: Patient instructed to return to the office for a follow up visit 3 months for continued evaluation and treatment.

## 2015-06-19 ENCOUNTER — Encounter: Payer: Self-pay | Admitting: *Deleted

## 2015-06-29 ENCOUNTER — Ambulatory Visit: Payer: Self-pay | Admitting: Cardiovascular Disease

## 2015-07-02 ENCOUNTER — Encounter: Payer: Self-pay | Admitting: Cardiovascular Disease

## 2015-07-02 ENCOUNTER — Ambulatory Visit (INDEPENDENT_AMBULATORY_CARE_PROVIDER_SITE_OTHER): Payer: Medicare Other | Admitting: Cardiovascular Disease

## 2015-07-02 VITALS — BP 126/76 | HR 59 | Ht 63.0 in | Wt 160.0 lb

## 2015-07-02 DIAGNOSIS — I2510 Atherosclerotic heart disease of native coronary artery without angina pectoris: Secondary | ICD-10-CM

## 2015-07-02 DIAGNOSIS — I5031 Acute diastolic (congestive) heart failure: Secondary | ICD-10-CM

## 2015-07-02 DIAGNOSIS — I5043 Acute on chronic combined systolic (congestive) and diastolic (congestive) heart failure: Secondary | ICD-10-CM | POA: Diagnosis not present

## 2015-07-02 DIAGNOSIS — I10 Essential (primary) hypertension: Secondary | ICD-10-CM | POA: Diagnosis not present

## 2015-07-02 NOTE — Patient Instructions (Signed)
Medication Instructions:  Your physician recommends that you continue on your current medications as directed. Please refer to the Current Medication list given to you today.   Labwork: TODAY - cholesterol, liver, basic metabolic panel   Testing/Procedures: None Ordered   Follow-Up: Your physician wants you to follow-up in: 1 year with Dr. Acie Fredrickson.  You will receive a reminder letter in the mail two months in advance. If you don't receive a letter, please call our office to schedule the follow-up appointment.

## 2015-07-02 NOTE — Progress Notes (Signed)
Mandy Larson Date of Birth  22-Mar-1930       [facility]    [employer] [address], [address], [place], [place]  [address]   [place], [place]  [address] [phone]     [phone]   Fax  [phone]    Fax [phone]  Problem List: 1. Coronary artery disease-status post myocardial infarction/ACS 2. Acute on Chronic Systolic CHF -   Left ventricle:  mildly dilated, EF 35% to 40%. akinesis of the entire inferior and inferoseptal myocardium. Grade 2  diastolic dysfunction. - Mitral valve: Calcified annulus. There was likely dysfunction/tethering of the posterior papillary muscle as a result of inferior infarct. Severe regurgitation directed centrally and posteriorly. - Right ventricle: The cavity size was mildly dilated. Wall thickness was normal. - Pulmonic valve: Moderate regurgitation. - Pulmonary arteries: Systolic pressure was moderately increased. PA peak pressure: 81mm Hg (S). 3. Hypertension 4. Mitral regurgitation-severe-likely due to  inferior wall myocardial infarction 5. Type 2 diabetes mellitus 6.  Hyperlipidemia 7. Chronic kidney disease - baseline Creatinine = 2.0-2.5  History of Present Illness: Mandy Larson is an 81 yo female who was admitted to the hospital in 11-25-2012.  She was originally seen in consultation by Dr. Aundra Dubin. She presented with an acute coronary syndrome. She was found to have mildly positive cardiac enzymes. An echocardiogram revealed inferior akinesis. She has an ejection fraction of around 35%. She has severe mitral regurgitation. She also has moderate pulmonary hypertension.  We did not perform a cardiac catheterization because of her chronic kidney disease. She did very well in that local therapy and was discharged in satisfactory condition. She's done very well on medical therapy. She's not had any further episodes of  chest pain or shortness of breath.  February 28, 2013: She gets short of  breath with climbing stairs and other exertion.   She is not avoiding salt   May 23, 2013: [name] is doing better - trying to avoid in the extra salt.  Feb. 12, 2015:  Mandy Larson is doing great.  She has been able to do all of her normal activities without much difficulty.  No CP , no dyspnea.  She walks around in her 3 story house.  She is taking care of her sick husband which takes up most of her time.  She's able to go grocery shopping without too much difficulty.  June 26, 2014: No complaints. No real exercise, busy keeping house   Aug. 22, 2016: Doing well, stays busy. Her husband passed away in 11/26/2023.  No CP or dyspnea.     Current Outpatient Prescriptions on File Prior to Visit  Medication Sig Dispense Refill  . amLODipine (NORVASC) 10 MG tablet Take 10 mg by mouth daily.    Marland Kitchen aspirin EC 81 MG tablet Take 81 mg by mouth daily.    . Calcium Carbonate (CALTRATE 600 PO) Take 600 mg by mouth daily.     . carvedilol (COREG) 6.25 MG tablet Take 1 tablet (6.25 mg total) by mouth 2 (two) times daily with a meal. 60 tablet 5  . furosemide (LASIX) 20 MG tablet Take 1 tablet (20 mg total) by mouth daily. 30 tablet 6  . glipiZIDE (GLUCOTROL) 5 MG tablet Take 1 tablet (5 mg total) by mouth daily. 30 tablet 1  . isosorbide mononitrate (IMDUR) 30 MG 24 hr tablet Take 1 tablet (30 mg total) by mouth daily. 30 tablet 5  . levothyroxine (  SYNTHROID, LEVOTHROID) 50 MCG tablet Take 50 mcg by mouth daily.    . Multiple Vitamins-Minerals (CENTRUM SILVER PO) Take 1 tablet by mouth daily. Dose Unknown    . simvastatin (ZOCOR) 20 MG tablet Take 20 mg by mouth every evening.    . traMADol (ULTRAM) 50 MG tablet Take 50 mg by mouth every 6 (six) hours as needed for moderate pain.      No current facility-administered medications on file prior to visit.    No Known Allergies  Past Medical History  Diagnosis Date  . Diabetes mellitus without complication   . Anginal pain   . Hypertension   . CKD  (chronic kidney disease)   . Anemia   . Acute systolic congestive heart failure, NYHA class 2 11/08/2012  . Former smoker   . Hyperlipidemia   . CAD (coronary artery disease) 06/26/2014    Past Surgical History  Procedure Laterality Date  . Eye surgery      History  Smoking status  . Former Smoker  . Types: Cigarettes  . Quit date: 11/09/1999  Smokeless tobacco  . Not on file    History  Alcohol Use No    Family History  Problem Relation Age of Onset  . Family history unknown: Yes    Reviw of Systems:  Reviewed in the HPI.  All other systems are negative.  Physical Exam: Blood pressure 126/76, pulse 59, height 5\' 3"  (1.6 m), weight 72.576 kg (160 lb). General: Well developed, well nourished, in no acute distress.  Head: Normocephalic, atraumatic, sclera non-icteric, mucus membranes are moist,  Neck: Supple. Carotids are 2 + without bruits. Neck veins are flat.  Lungs: Clear   Heart: RR, normal S1, S2, soft systolic murmur  Abdomen: Soft, non-tender, non-distended with normal bowel sounds.  Msk:  Strength and tone are normal  Extremities: No clubbing or cyanosis. No edema.  Distal pedal pulses are 2+ and equal   Neuro: CN II - XII intact.  Alert and oriented X 3.   Psych:  Normal   ECG: Aug. 22, 2016:  Sinus brady at 59.  RBBB , no signifcant changes from previous   Assessment / Plan:   1. Coronary artery disease-status post myocardial infarction/ACS 2. Chronic  Combined Systolic adn diastolic CHF -   Left ventricle:  mildly dilated, EF 35% to 40%. akinesis of the entire inferior and inferoseptal myocardium. Grade 2  diastolic dysfunction. - Mitral valve: Calcified annulus. There was likely dysfunction/tethering of the posterior papillary muscle as a result of inferior infarct. Severe regurgitation directed centrally and posteriorly. - Right ventricle: The cavity size was mildly dilated. Wall thickness was normal. - Pulmonic valve: Moderate  regurgitation. - Pulmonary arteries: Systolic pressure was moderately increased. PA peak pressure: 57mm Hg (S).  3. Hypertension:   blood pressure is well-controlled. Continue current medications. We'll check labs today.  4. Mitral regurgitation-severe-likely due to  inferior wall myocardial infarction - seems to be stable   5. Type 2 diabetes mellitus 6.  Hyperlipidemia - will check labs today   7. Chronic kidney disease - baseline Creatinine = 2.0-2.5    Nahser, Wonda Cheng, MD  07/02/2015 3:45 PM    [address] [place],  [place], [place]  [address] Pager [phone] Phone: [phone]; Fax: [phone]   [facility]  [address] [place], [place]  [address] [phone]   Fax [phone]

## 2015-07-03 LAB — LIPID PANEL
CHOL/HDL RATIO: 2
Cholesterol: 150 mg/dL (ref 0–200)
HDL: 78.4 mg/dL (ref >39.00)
LDL Cholesterol: 61 mg/dL (ref 0–99)
NONHDL: 71.51
Triglycerides: 53 mg/dL (ref 0.0–149.0)
VLDL: 10.6 mg/dL (ref 0.0–40.0)

## 2015-07-03 LAB — BASIC METABOLIC PANEL
BUN: 41 mg/dL — ABNORMAL HIGH (ref 6–23)
CALCIUM: 10.1 mg/dL (ref 8.4–10.5)
CO2: 31 mEq/L (ref 19–32)
CREATININE: 2.96 mg/dL — AB (ref 0.40–1.20)
Chloride: 98 mEq/L (ref 96–112)
GFR: 19.44 mL/min — ABNORMAL LOW (ref >60.00)
Glucose, Bld: 112 mg/dL — ABNORMAL HIGH (ref 70–99)
Potassium: 3.9 mEq/L (ref 3.5–5.1)
SODIUM: 139 meq/L (ref 135–145)

## 2015-07-03 LAB — HEPATIC FUNCTION PANEL
ALK PHOS: 46 U/L (ref 39–117)
ALT: 12 U/L (ref 0–35)
AST: 23 U/L (ref 0–37)
Albumin: 4.1 g/dL (ref 3.5–5.2)
BILIRUBIN DIRECT: 0.1 mg/dL (ref 0.0–0.3)
BILIRUBIN TOTAL: 0.4 mg/dL (ref 0.2–1.2)
TOTAL PROTEIN: 7.6 g/dL (ref 6.0–8.3)

## 2015-07-04 ENCOUNTER — Telehealth: Payer: Self-pay | Admitting: Cardiovascular Disease

## 2015-07-04 NOTE — Telephone Encounter (Signed)
New Message   Pt returning call  Please call pt

## 2015-07-04 NOTE — Telephone Encounter (Signed)
Reviewed lab results with patient and advised her of worsening kidney function.  She states she does not have a nephrologist.  I advised her that I am routing results to her PCP, Dr. [name] and to call his office for follow-up.  She verbalized understanding and agreement.

## 2015-07-12 ENCOUNTER — Ambulatory Visit: Payer: Medicare Other | Admitting: Podiatry

## 2015-07-18 ENCOUNTER — Encounter: Payer: Self-pay | Admitting: Podiatry

## 2015-07-18 ENCOUNTER — Ambulatory Visit (INDEPENDENT_AMBULATORY_CARE_PROVIDER_SITE_OTHER): Payer: Medicare Other | Admitting: Podiatry

## 2015-07-18 VITALS — BP 102/49 | HR 53 | Resp 16

## 2015-07-18 DIAGNOSIS — B351 Tinea unguium: Secondary | ICD-10-CM | POA: Diagnosis not present

## 2015-07-18 DIAGNOSIS — M79676 Pain in unspecified toe(s): Secondary | ICD-10-CM

## 2015-07-18 NOTE — Progress Notes (Signed)
Patient ID: Mandy Larson, female   DOB: Jun 18, 1930, 81 y.o.   MRN: [id number] Complaint:  Visit Type: Patient returns to my office for continued preventative foot care services. Complaint: Patient states" my nails have grown long and thick and become painful to walk and wear shoes" Patient has been diagnosed with DM with no complications. He presents for preventative foot care services. No changes to ROS.  Painful callus under left forefoot.  Podiatric Exam: Vascular: dorsalis pedis and posterior tibial pulses are palpable bilateral. Capillary return is immediate. Temperature gradient is WNL. Skin turgor WNL  Sensorium: Normal Semmes Weinstein monofilament test. Normal tactile sensation bilaterally. Nail Exam: Pt has thick disfigured discolored nails with subungual debris noted bilateral entire nail hallux through fifth toenails Ulcer Exam: There is no evidence of ulcer or pre-ulcerative changes or infection. Orthopedic Exam: Muscle tone and strength are WNL. No limitations in general ROM. No crepitus or effusions noted. Foot type and digits show no abnormalities. Bony prominences are unremarkable. Skin:    No porokeratosis. No infection or ulcers  Diagnosis:  Tinea unguium, Pain in right toe, pain in left toes  Treatment & Plan Procedures and Treatment: Consent by patient was obtained for treatment procedures. The patient understood the discussion of treatment and procedures well. All questions were answered thoroughly reviewed. Debridement of mycotic and hypertrophic toenails, 1 through 5 bilateral and clearing of subungual debris. No ulceration, no infection noted.  Return Visit-Office Procedure: Patient instructed to return to the office for a follow up visit 3 months for continued evaluation and treatment.

## 2015-10-10 ENCOUNTER — Encounter: Payer: Self-pay | Admitting: Podiatry

## 2015-10-10 ENCOUNTER — Ambulatory Visit: Payer: Medicare Other | Admitting: Podiatry

## 2015-10-10 ENCOUNTER — Ambulatory Visit (INDEPENDENT_AMBULATORY_CARE_PROVIDER_SITE_OTHER): Payer: Medicare Other | Admitting: Podiatry

## 2015-10-10 DIAGNOSIS — B351 Tinea unguium: Secondary | ICD-10-CM

## 2015-10-10 DIAGNOSIS — M79676 Pain in unspecified toe(s): Secondary | ICD-10-CM

## 2015-10-10 NOTE — Patient Instructions (Signed)
Diabetes and Foot Care Diabetes may cause you to have problems because of poor blood supply (circulation) to your feet and legs. This may cause the skin on your feet to become thinner, break easier, and heal more slowly. Your skin may become dry, and the skin may peel and crack. You may also have nerve damage in your legs and feet causing decreased feeling in them. You may not notice minor injuries to your feet that could lead to infections or more serious problems. Taking care of your feet is one of the most important things you can do for yourself.  HOME CARE INSTRUCTIONS  Wear shoes at all times, even in the house. Do not go barefoot. Bare feet are easily injured.  Check your feet daily for blisters, cuts, and redness. If you cannot see the bottom of your feet, use a mirror or ask someone for help.  Wash your feet with warm water (do not use hot water) and mild soap. Then pat your feet and the areas between your toes until they are completely dry. Do not soak your feet as this can dry your skin.  Apply a moisturizing lotion or petroleum jelly (that does not contain alcohol and is unscented) to the skin on your feet and to dry, brittle toenails. Do not apply lotion between your toes.  Trim your toenails straight across. Do not dig under them or around the cuticle. File the edges of your nails with an emery board or nail file.  Do not cut corns or calluses or try to remove them with medicine.  Wear clean socks or stockings every day. Make sure they are not too tight. Do not wear knee-high stockings since they may decrease blood flow to your legs.  Wear shoes that fit properly and have enough cushioning. To break in new shoes, wear them for just a few hours a day. This prevents you from injuring your feet. Always look in your shoes before you put them on to be sure there are no objects inside.  Do not cross your legs. This may decrease the blood flow to your feet.  If you find a minor scrape,  cut, or break in the skin on your feet, keep it and the skin around it clean and dry. These areas may be cleansed with mild soap and water. Do not cleanse the area with peroxide, alcohol, or iodine.  When you remove an adhesive bandage, be sure not to damage the skin around it.  If you have a wound, look at it several times a day to make sure it is healing.  Do not use heating pads or hot water bottles. They may burn your skin. If you have lost feeling in your feet or legs, you may not know it is happening until it is too late.  Make sure your health care provider performs a complete foot exam at least annually or more often if you have foot problems. Report any cuts, sores, or bruises to your health care provider immediately. SEEK MEDICAL CARE IF:   You have an injury that is not healing.  You have cuts or breaks in the skin.  You have an ingrown nail.  You notice redness on your legs or feet.  You feel burning or tingling in your legs or feet.  You have pain or cramps in your legs and feet.  Your legs or feet are numb.  Your feet always feel cold. SEEK IMMEDIATE MEDICAL CARE IF:   There is increasing redness,   swelling, or pain in or around a wound.  There is a red line that goes up your leg.  Pus is coming from a wound.  You develop a fever or as directed by your health care provider.  You notice a bad smell coming from an ulcer or wound.   This information is not intended to replace advice given to you by your health care provider. Make sure you discuss any questions you have with your health care provider.   Document Released: 10/24/2000 Document Revised: 06/29/2013 Document Reviewed: 04/05/2013 Elsevier Interactive Patient Education 2016 [employer]  

## 2015-10-11 NOTE — Progress Notes (Signed)
Patient ID: Mandy Larson, female   DOB: 1929/12/13, 81 y.o.   MRN: [id number]  Subjective: This patient presents again today complaining of painful toenails which are thick and uncomfortable walking wearing shoes as requesting debridement. The last visit for this similar service was on 07/18/2015  Objective: No open skin lesions bilaterally The toenails are elongated, hypertrophic, discolored, deformed and tender to direct palpation 6-10  Assessment: Symptomatic onychomycoses 6-10 Type II diabetic  Plan: Debridement toenails 10 mechanically and electrically without any bleeding  Reappoint 3 months

## 2016-01-15 ENCOUNTER — Encounter: Payer: Self-pay | Admitting: Podiatry

## 2016-01-15 ENCOUNTER — Ambulatory Visit (INDEPENDENT_AMBULATORY_CARE_PROVIDER_SITE_OTHER): Payer: Medicare HMO | Admitting: Podiatry

## 2016-01-15 DIAGNOSIS — M79676 Pain in unspecified toe(s): Secondary | ICD-10-CM

## 2016-01-15 DIAGNOSIS — B351 Tinea unguium: Secondary | ICD-10-CM | POA: Diagnosis not present

## 2016-01-15 NOTE — Progress Notes (Signed)
Patient ID: Mandy Larson, female   DOB: [birth year], 82 y.o.   MRN: [id number]  Subjective: This patient presents again today complaining of painful toenails which are thick and uncomfortable walking wearing shoes as requesting debridement. The last visit for this similar service was 10/11/2015  Objective: No open skin lesions bilaterally The toenails are elongated, hypertrophic, discolored, deformed and tender to direct palpation 6-10  Assessment: Symptomatic onychomycoses 6-10 Type II diabetic  Plan: Debridement toenails 10 mechanically and electrically without any bleeding  Reappoint 3 months

## 2016-01-15 NOTE — Patient Instructions (Signed)
Diabetes and Foot Care Diabetes may cause you to have problems because of poor blood supply (circulation) to your feet and legs. This may cause the skin on your feet to become thinner, break easier, and heal more slowly. Your skin may become dry, and the skin may peel and crack. You may also have nerve damage in your legs and feet causing decreased feeling in them. You may not notice minor injuries to your feet that could lead to infections or more serious problems. Taking care of your feet is one of the most important things you can do for yourself.  HOME CARE INSTRUCTIONS  Wear shoes at all times, even in the house. Do not go barefoot. Bare feet are easily injured.  Check your feet daily for blisters, cuts, and redness. If you cannot see the bottom of your feet, use a mirror or ask someone for help.  Wash your feet with warm water (do not use hot water) and mild soap. Then pat your feet and the areas between your toes until they are completely dry. Do not soak your feet as this can dry your skin.  Apply a moisturizing lotion or petroleum jelly (that does not contain alcohol and is unscented) to the skin on your feet and to dry, brittle toenails. Do not apply lotion between your toes.  Trim your toenails straight across. Do not dig under them or around the cuticle. File the edges of your nails with an emery board or nail file.  Do not cut corns or calluses or try to remove them with medicine.  Wear clean socks or stockings every day. Make sure they are not too tight. Do not wear knee-high stockings since they may decrease blood flow to your legs.  Wear shoes that fit properly and have enough cushioning. To break in new shoes, wear them for just a few hours a day. This prevents you from injuring your feet. Always look in your shoes before you put them on to be sure there are no objects inside.  Do not cross your legs. This may decrease the blood flow to your feet.  If you find a minor scrape,  cut, or break in the skin on your feet, keep it and the skin around it clean and dry. These areas may be cleansed with mild soap and water. Do not cleanse the area with peroxide, alcohol, or iodine.  When you remove an adhesive bandage, be sure not to damage the skin around it.  If you have a wound, look at it several times a day to make sure it is healing.  Do not use heating pads or hot water bottles. They may burn your skin. If you have lost feeling in your feet or legs, you may not know it is happening until it is too late.  Make sure your health care provider performs a complete foot exam at least annually or more often if you have foot problems. Report any cuts, sores, or bruises to your health care provider immediately. SEEK MEDICAL CARE IF:   You have an injury that is not healing.  You have cuts or breaks in the skin.  You have an ingrown nail.  You notice redness on your legs or feet.  You feel burning or tingling in your legs or feet.  You have pain or cramps in your legs and feet.  Your legs or feet are numb.  Your feet always feel cold. SEEK IMMEDIATE MEDICAL CARE IF:   There is increasing redness,   swelling, or pain in or around a wound.  There is a red line that goes up your leg.  Pus is coming from a wound.  You develop a fever or as directed by your health care provider.  You notice a bad smell coming from an ulcer or wound.   This information is not intended to replace advice given to you by your health care provider. Make sure you discuss any questions you have with your health care provider.   Document Released: 10/24/2000 Document Revised: 06/29/2013 Document Reviewed: 04/05/2013 Elsevier Interactive Patient Education 2016 [employer]  

## 2016-04-22 ENCOUNTER — Ambulatory Visit (INDEPENDENT_AMBULATORY_CARE_PROVIDER_SITE_OTHER): Payer: Medicare HMO | Admitting: Podiatry

## 2016-04-22 ENCOUNTER — Encounter: Payer: Self-pay | Admitting: Podiatry

## 2016-04-22 DIAGNOSIS — M79676 Pain in unspecified toe(s): Secondary | ICD-10-CM

## 2016-04-22 DIAGNOSIS — B351 Tinea unguium: Secondary | ICD-10-CM

## 2016-04-22 NOTE — Patient Instructions (Signed)
Diabetes and Foot Care Diabetes may cause you to have problems because of poor blood supply (circulation) to your feet and legs. This may cause the skin on your feet to become thinner, break easier, and heal more slowly. Your skin may become dry, and the skin may peel and crack. You may also have nerve damage in your legs and feet causing decreased feeling in them. You may not notice minor injuries to your feet that could lead to infections or more serious problems. Taking care of your feet is one of the most important things you can do for yourself.  HOME CARE INSTRUCTIONS  Wear shoes at all times, even in the house. Do not go barefoot. Bare feet are easily injured.  Check your feet daily for blisters, cuts, and redness. If you cannot see the bottom of your feet, use a mirror or ask someone for help.  Wash your feet with warm water (do not use hot water) and mild soap. Then pat your feet and the areas between your toes until they are completely dry. Do not soak your feet as this can dry your skin.  Apply a moisturizing lotion or petroleum jelly (that does not contain alcohol and is unscented) to the skin on your feet and to dry, brittle toenails. Do not apply lotion between your toes.  Trim your toenails straight across. Do not dig under them or around the cuticle. File the edges of your nails with an emery board or nail file.  Do not cut corns or calluses or try to remove them with medicine.  Wear clean socks or stockings every day. Make sure they are not too tight. Do not wear knee-high stockings since they may decrease blood flow to your legs.  Wear shoes that fit properly and have enough cushioning. To break in new shoes, wear them for just a few hours a day. This prevents you from injuring your feet. Always look in your shoes before you put them on to be sure there are no objects inside.  Do not cross your legs. This may decrease the blood flow to your feet.  If you find a minor scrape,  cut, or break in the skin on your feet, keep it and the skin around it clean and dry. These areas may be cleansed with mild soap and water. Do not cleanse the area with peroxide, alcohol, or iodine.  When you remove an adhesive bandage, be sure not to damage the skin around it.  If you have a wound, look at it several times a day to make sure it is healing.  Do not use heating pads or hot water bottles. They may burn your skin. If you have lost feeling in your feet or legs, you may not know it is happening until it is too late.  Make sure your health care provider performs a complete foot exam at least annually or more often if you have foot problems. Report any cuts, sores, or bruises to your health care provider immediately. SEEK MEDICAL CARE IF:   You have an injury that is not healing.  You have cuts or breaks in the skin.  You have an ingrown nail.  You notice redness on your legs or feet.  You feel burning or tingling in your legs or feet.  You have pain or cramps in your legs and feet.  Your legs or feet are numb.  Your feet always feel cold. SEEK IMMEDIATE MEDICAL CARE IF:   There is increasing redness,   swelling, or pain in or around a wound.  There is a red line that goes up your leg.  Pus is coming from a wound.  You develop a fever or as directed by your health care provider.  You notice a bad smell coming from an ulcer or wound.   This information is not intended to replace advice given to you by your health care provider. Make sure you discuss any questions you have with your health care provider.   Document Released: 10/24/2000 Document Revised: 06/29/2013 Document Reviewed: 04/05/2013 Elsevier Interactive Patient Education 2016 [employer]  

## 2016-04-22 NOTE — Progress Notes (Signed)
Patient ID: Mandy Larson, female   DOB: 1930/07/10, 82 y.o.   MRN: [id number]  Subjective: This patient presents again today complaining of painful toenails which are thick and uncomfortable walking wearing shoes as requesting debridement. The last visit for this similar service was 10/11/2015  Objective: No open skin lesions bilaterally The toenails are elongated, hypertrophic, discolored, deformed and tender to direct palpation 6-10  Assessment: Symptomatic onychomycoses 6-10 Type II diabetic  Plan: Debridement toenails 10 mechanically and electrically without any bleeding  Reappoint 3 months

## 2016-07-07 ENCOUNTER — Encounter: Payer: Self-pay | Admitting: Cardiovascular Disease

## 2016-07-07 ENCOUNTER — Ambulatory Visit (INDEPENDENT_AMBULATORY_CARE_PROVIDER_SITE_OTHER): Payer: Medicare HMO | Admitting: Cardiovascular Disease

## 2016-07-07 VITALS — BP 124/68 | HR 54 | Ht 63.0 in | Wt 158.4 lb

## 2016-07-07 DIAGNOSIS — I2510 Atherosclerotic heart disease of native coronary artery without angina pectoris: Secondary | ICD-10-CM | POA: Diagnosis not present

## 2016-07-07 DIAGNOSIS — I10 Essential (primary) hypertension: Secondary | ICD-10-CM

## 2016-07-07 LAB — COMPREHENSIVE METABOLIC PANEL
ALK PHOS: 50 U/L (ref 33–130)
ALT: 14 U/L (ref 6–29)
AST: 23 U/L (ref 10–35)
Albumin: 4 g/dL (ref 3.6–5.1)
BILIRUBIN TOTAL: 0.4 mg/dL (ref 0.2–1.2)
BUN: 41 mg/dL — AB (ref 7–25)
CO2: 29 mmol/L (ref 20–31)
CREATININE: 2.89 mg/dL — AB (ref 0.60–0.88)
Calcium: 9.7 mg/dL (ref 8.6–10.4)
Chloride: 103 mmol/L (ref 98–110)
GLUCOSE: 120 mg/dL — AB (ref 65–99)
POTASSIUM: 4.3 mmol/L (ref 3.5–5.3)
SODIUM: 141 mmol/L (ref 135–146)
TOTAL PROTEIN: 7.4 g/dL (ref 6.1–8.1)

## 2016-07-07 LAB — LIPID PANEL
CHOL/HDL RATIO: 1.5 ratio (ref <=5.0)
Cholesterol: 142 mg/dL (ref 125–200)
HDL: 97 mg/dL (ref >=46)
LDL Cholesterol: 37 mg/dL (ref <130)
Triglycerides: 42 mg/dL (ref <150)
VLDL: 8 mg/dL (ref <30)

## 2016-07-07 MED ORDER — CARVEDILOL 3.125 MG PO TABS: 3.1250 mg | ORAL_TABLET | Freq: Two times a day (BID) | ORAL | 11 refills | Status: DC

## 2016-07-07 NOTE — Patient Instructions (Signed)
Medication Instructions:  DECREASE carvedilol (Coreg) to 3.125 mg twice daily.   Labwork: TODAY:  complete metabolic panel and cholesterol   Testing/Procedures: None ordered   Follow-Up: Your physician wants you to follow-up in 1 year with Dr. Acie Fredrickson. You will receive a reminder letter in the mail two months in advance. If you don't receive a letter, please call our office to schedule an appointment.   Any Other Special Instructions Will Be Listed Below (If Applicable).     If you need a refill on your cardiac medications before your next appointment, please call your pharmacy.

## 2016-07-07 NOTE — Progress Notes (Signed)
Mandy Larson Date of Birth  [birth year]       [facility]    [employer] [address], [address], [place], [place]  [address]   [place], [place]  [address] [phone]     [phone]   Fax  [phone]    Fax [phone]  Problem List: 1. Coronary artery disease-status post myocardial infarction/ACS 2. Acute on Chronic Systolic CHF -   Left ventricle:  mildly dilated, EF 35% to 40%. akinesis of the entire inferior and inferoseptal myocardium. Grade 2  diastolic dysfunction. - Mitral valve: Calcified annulus. There was likely dysfunction/tethering of the posterior papillary muscle as a result of inferior infarct. Severe regurgitation directed centrally and posteriorly. - Right ventricle: The cavity size was mildly dilated. Wall thickness was normal. - Pulmonic valve: Moderate regurgitation. - Pulmonary arteries: Systolic pressure was moderately increased. PA peak pressure: 24mm Hg (S). 3. Hypertension 4. Mitral regurgitation-severe-likely due to  inferior wall myocardial infarction 5. Type 2 diabetes mellitus 6.  Hyperlipidemia 7. Chronic kidney disease - baseline Creatinine = 2.0-2.5    Ms. Lamay is an 82 yo female who was admitted to the hospital in Nov 22, 2012.  She was originally seen in consultation by Dr. Aundra Dubin. She presented with an acute coronary syndrome. She was found to have mildly positive cardiac enzymes. An echocardiogram revealed inferior akinesis. She has an ejection fraction of around 35%. She has severe mitral regurgitation. She also has moderate pulmonary hypertension.  We did not perform a cardiac catheterization because of her chronic kidney disease. She did very well in that local therapy and was discharged in satisfactory condition. She's done very well on medical therapy. She's not had any further episodes of  chest pain or shortness of breath.  February 28, 2013: She gets short of breath with climbing stairs  and other exertion.   She is not avoiding salt   May 23, 2013: Ms. Liakos is doing better - trying to avoid in the extra salt.  Feb. 12, 2015:  Ms. Rotar is doing great.  She has been able to do all of her normal activities without much difficulty.  No CP , no dyspnea.  She walks around in her 3 story house.  She is taking care of her sick husband which takes up most of her time.  She's able to go grocery shopping without too much difficulty.  June 26, 2014: No complaints. No real exercise, busy keeping house   Aug. 22, 2016: Doing well, stays busy. Her husband passed away in 11/23/23.  No CP or dyspnea.   Aug. 28, 2017  Doing ok. No CP Has some DOE when she overdoes it. ( climbing stairs)  No syncope or presyncope Her last BMP showed a creatinine of 2.9.   Will repeat     Current Outpatient Prescriptions on File Prior to Visit  Medication Sig Dispense Refill  . amLODipine (NORVASC) 10 MG tablet Take 10 mg by mouth daily.    Marland Kitchen aspirin EC 81 MG tablet Take 81 mg by mouth daily.    . Calcium Carbonate (CALTRATE 600 PO) Take 600 mg by mouth daily.     . carvedilol (COREG) 6.25 MG tablet Take 1 tablet (6.25 mg total) by mouth 2 (two) times daily with a meal. 60 tablet 5  . furosemide (LASIX) 20 MG tablet Take 1 tablet (20 mg total) by mouth daily. 30 tablet 6  . glipiZIDE (GLUCOTROL) 5 MG tablet Take 1  tablet (5 mg total) by mouth daily. 30 tablet 1  . isosorbide mononitrate (IMDUR) 30 MG 24 hr tablet Take 1 tablet (30 mg total) by mouth daily. 30 tablet 5  . levothyroxine (SYNTHROID, LEVOTHROID) 50 MCG tablet Take 50 mcg by mouth daily.    . Multiple Vitamins-Minerals (CENTRUM SILVER PO) Take 1 tablet by mouth daily. Dose Unknown    . simvastatin (ZOCOR) 20 MG tablet Take 20 mg by mouth every evening.    . traMADol (ULTRAM) 50 MG tablet Take 50 mg by mouth every 6 (six) hours as needed for moderate pain.      No current facility-administered medications on file prior to visit.      No Known Allergies  Past Medical History:  Diagnosis Date  . Acute systolic congestive heart failure, NYHA class 2 ([place]) 11/08/2012  . Anemia   . Anginal pain ([place])   . CAD (coronary artery disease) 06/26/2014  . CKD (chronic kidney disease)   . Diabetes mellitus without complication ([place])   . Former smoker   . Hyperlipidemia   . Hypertension     Past Surgical History:  Procedure Laterality Date  . EYE SURGERY      History  Smoking Status  . Former Smoker  . Types: Cigarettes  . Quit date: 11/09/1999  Smokeless Tobacco  . Not on file    History  Alcohol Use No    Family History  Problem Relation Age of Onset  . Family history unknown: Yes    Reviw of Systems:  Reviewed in the HPI.  All other systems are negative.  Physical Exam: Blood pressure 124/68, pulse (!) 54, height 5\' 3"  (1.6 m), weight 158 lb 6.4 oz (71.8 kg). General: Well developed, well nourished, in no acute distress.  Head: Normocephalic, atraumatic, sclera non-icteric, mucus membranes are moist,  Neck: Supple. Carotids are 2 + without bruits. Neck veins are flat.  Lungs: Clear   Heart: RR, normal S1, S2, soft systolic murmur  Abdomen: Soft, non-tender, non-distended with normal bowel sounds.  Msk:  Strength and tone are normal  Extremities: No clubbing or cyanosis. No edema.  Distal pedal pulses are 2+ and equal   Neuro: CN II - XII intact.  Alert and oriented X 3.   Psych:  Normal   ECG: 06/29/2016: Sinus bradycardia at 54 beats a minute. She has a right bundle-branch block. There is lateral T-wave inversions.  Assessment / Plan:   1. Coronary artery disease-status post myocardial infarction/ACS 2. Chronic  Combined Systolic adn diastolic CHF -   Left ventricle:  mildly dilated, EF 35% to 40%. akinesis of the entire inferior and inferoseptal myocardium. Grade 2  diastolic dysfunction. - Mitral valve: Calcified annulus. There was likely dysfunction/tethering of the posterior  papillary muscle as a result of inferior infarct. Severe regurgitation directed centrally and posteriorly. - Right ventricle: The cavity size was mildly dilated. Wall thickness was normal. - Pulmonic valve: Moderate regurgitation. - Pulmonary arteries: Systolic pressure was moderately increased. PA peak pressure: 18mm Hg (S).  3. Hypertension:   blood pressure is well-controlled.    4. Mitral regurgitation-severe-likely due to  inferior wall myocardial infarction - seems to be stable   5. Type 2 diabetes mellitus 6.  Hyperlipidemia - will check labs today   7. Chronic kidney disease - baseline Creatinine = 2.96   . Recheck today . She will need to follow up with her primary MD   8. Sinus brady:   Will decrease coreg to 3.125  BID      Mertie Moores, MD  07/07/2016 9:44 AM    [facility] [place],  [place], [place]  [address] Pager [phone] Phone: [phone]; Fax: [phone]

## 2016-07-29 ENCOUNTER — Encounter: Payer: Self-pay | Admitting: Podiatry

## 2016-07-29 ENCOUNTER — Ambulatory Visit (INDEPENDENT_AMBULATORY_CARE_PROVIDER_SITE_OTHER): Payer: Medicare HMO | Admitting: Podiatry

## 2016-07-29 VITALS — BP 133/53 | HR 62 | Resp 14

## 2016-07-29 DIAGNOSIS — M79676 Pain in unspecified toe(s): Secondary | ICD-10-CM

## 2016-07-29 DIAGNOSIS — B351 Tinea unguium: Secondary | ICD-10-CM | POA: Diagnosis not present

## 2016-07-29 NOTE — Progress Notes (Signed)
Patient ID: Mandy Larson, female   DOB: November 25, 1929, 82 y.o.   MRN: [id number]    Subjective: This patient presents again today complaining of painful toenails which are thick and uncomfortable walking wearing shoes as requesting debridement. The last visit for this similar service was 10/11/2015  Objective: Orientated 3 DP right 0/4, DP 4/4 left PT pulses 2/4 bilaterally Capillary reflex within normal limits bilaterally Sensation to 10 g monofilament wire intact 5/5 bilaterally Vibratory sensation intact bilaterally Ankle reflex equal and reactive bilaterally HAV bilaterally No open skin lesions bilaterally The toenails are elongated, hypertrophic, discolored, deformed and tender to direct palpation 6-10  Assessment: Protective sensation intact Vascular status appears adequate Symptomatic onychomycoses 6-10 Type II diabetic  Plan: Debridement toenails 10 mechanically and electrically without any bleeding  Reappoint 3 months

## 2016-07-29 NOTE — Patient Instructions (Signed)
Diabetes and Foot Care Diabetes may cause you to have problems because of poor blood supply (circulation) to your feet and legs. This may cause the skin on your feet to become thinner, break easier, and heal more slowly. Your skin may become dry, and the skin may peel and crack. You may also have nerve damage in your legs and feet causing decreased feeling in them. You may not notice minor injuries to your feet that could lead to infections or more serious problems. Taking care of your feet is one of the most important things you can do for yourself.  HOME CARE INSTRUCTIONS  Wear shoes at all times, even in the house. Do not go barefoot. Bare feet are easily injured.  Check your feet daily for blisters, cuts, and redness. If you cannot see the bottom of your feet, use a mirror or ask someone for help.  Wash your feet with warm water (do not use hot water) and mild soap. Then pat your feet and the areas between your toes until they are completely dry. Do not soak your feet as this can dry your skin.  Apply a moisturizing lotion or petroleum jelly (that does not contain alcohol and is unscented) to the skin on your feet and to dry, brittle toenails. Do not apply lotion between your toes.  Trim your toenails straight across. Do not dig under them or around the cuticle. File the edges of your nails with an emery board or nail file.  Do not cut corns or calluses or try to remove them with medicine.  Wear clean socks or stockings every day. Make sure they are not too tight. Do not wear knee-high stockings since they may decrease blood flow to your legs.  Wear shoes that fit properly and have enough cushioning. To break in new shoes, wear them for just a few hours a day. This prevents you from injuring your feet. Always look in your shoes before you put them on to be sure there are no objects inside.  Do not cross your legs. This may decrease the blood flow to your feet.  If you find a minor scrape,  cut, or break in the skin on your feet, keep it and the skin around it clean and dry. These areas may be cleansed with mild soap and water. Do not cleanse the area with peroxide, alcohol, or iodine.  When you remove an adhesive bandage, be sure not to damage the skin around it.  If you have a wound, look at it several times a day to make sure it is healing.  Do not use heating pads or hot water bottles. They may burn your skin. If you have lost feeling in your feet or legs, you may not know it is happening until it is too late.  Make sure your health care provider performs a complete foot exam at least annually or more often if you have foot problems. Report any cuts, sores, or bruises to your health care provider immediately. SEEK MEDICAL CARE IF:   You have an injury that is not healing.  You have cuts or breaks in the skin.  You have an ingrown nail.  You notice redness on your legs or feet.  You feel burning or tingling in your legs or feet.  You have pain or cramps in your legs and feet.  Your legs or feet are numb.  Your feet always feel cold. SEEK IMMEDIATE MEDICAL CARE IF:   There is increasing redness,   swelling, or pain in or around a wound.  There is a red line that goes up your leg.  Pus is coming from a wound.  You develop a fever or as directed by your health care provider.  You notice a bad smell coming from an ulcer or wound.   This information is not intended to replace advice given to you by your health care provider. Make sure you discuss any questions you have with your health care provider.   Document Released: 10/24/2000 Document Revised: 06/29/2013 Document Reviewed: 04/05/2013 Elsevier Interactive Patient Education 2016 [employer]  

## 2016-10-23 ENCOUNTER — Other Ambulatory Visit: Payer: Self-pay | Admitting: *Deleted

## 2016-10-23 MED ORDER — CARVEDILOL 3.125 MG PO TABS: 3.1250 mg | ORAL_TABLET | Freq: Two times a day (BID) | ORAL | 3 refills | Status: DC

## 2016-10-28 ENCOUNTER — Ambulatory Visit (INDEPENDENT_AMBULATORY_CARE_PROVIDER_SITE_OTHER): Payer: Medicare HMO | Admitting: Podiatry

## 2016-10-28 ENCOUNTER — Encounter: Payer: Self-pay | Admitting: Podiatry

## 2016-10-28 VITALS — BP 156/75 | HR 62 | Resp 16

## 2016-10-28 DIAGNOSIS — B351 Tinea unguium: Secondary | ICD-10-CM

## 2016-10-28 DIAGNOSIS — M79676 Pain in unspecified toe(s): Secondary | ICD-10-CM | POA: Diagnosis not present

## 2016-10-28 NOTE — Progress Notes (Signed)
Patient ID: Mandy Larson, female   DOB: [birth year], 82 y.o.   MRN: [id number]    Subjective: This patient presents again today complaining of painful toenails which are thick and uncomfortable walking wearing shoes as requesting debridement. The last visit for this similar service was 10/11/2015  Objective: Orientated 3 DP right 0/4, DP 4/4 left PT pulses 2/4 bilaterally Capillary reflex within normal limits bilaterally Sensation to 10 g monofilament wire intact 5/5 bilaterally Vibratory sensation intact bilaterally Ankle reflex equal and reactive bilaterally HAV bilaterally No open skin lesions bilaterally The toenails are elongated, hypertrophic, discolored, deformed and tender to direct palpation 6-10  Assessment: Protective sensation intact Vascular status appears adequate Symptomatic onychomycoses 6-10 Type II diabetic  Plan: Debridement toenails 10 mechanically and electrically without any bleeding  Reappoint 3 months

## 2016-10-28 NOTE — Patient Instructions (Signed)

## 2016-11-13 DIAGNOSIS — H401123 Primary open-angle glaucoma, left eye, severe stage: Secondary | ICD-10-CM | POA: Diagnosis not present

## 2016-11-13 DIAGNOSIS — H401112 Primary open-angle glaucoma, right eye, moderate stage: Secondary | ICD-10-CM | POA: Diagnosis not present

## 2016-12-15 DIAGNOSIS — E1165 Type 2 diabetes mellitus with hyperglycemia: Secondary | ICD-10-CM | POA: Diagnosis not present

## 2016-12-15 DIAGNOSIS — D485 Neoplasm of uncertain behavior of skin: Secondary | ICD-10-CM | POA: Diagnosis not present

## 2016-12-16 DIAGNOSIS — E1165 Type 2 diabetes mellitus with hyperglycemia: Secondary | ICD-10-CM | POA: Diagnosis not present

## 2016-12-23 DIAGNOSIS — E119 Type 2 diabetes mellitus without complications: Secondary | ICD-10-CM | POA: Diagnosis not present

## 2016-12-23 DIAGNOSIS — H401113 Primary open-angle glaucoma, right eye, severe stage: Secondary | ICD-10-CM | POA: Diagnosis not present

## 2016-12-23 DIAGNOSIS — H401122 Primary open-angle glaucoma, left eye, moderate stage: Secondary | ICD-10-CM | POA: Diagnosis not present

## 2017-01-21 DIAGNOSIS — H401113 Primary open-angle glaucoma, right eye, severe stage: Secondary | ICD-10-CM | POA: Diagnosis not present

## 2017-01-27 ENCOUNTER — Ambulatory Visit (INDEPENDENT_AMBULATORY_CARE_PROVIDER_SITE_OTHER): Payer: Medicare HMO | Admitting: Podiatry

## 2017-01-27 ENCOUNTER — Ambulatory Visit: Payer: Medicare HMO | Admitting: Sports Medicine

## 2017-01-27 DIAGNOSIS — Z1231 Encounter for screening mammogram for malignant neoplasm of breast: Secondary | ICD-10-CM | POA: Diagnosis not present

## 2017-01-27 DIAGNOSIS — B351 Tinea unguium: Secondary | ICD-10-CM | POA: Diagnosis not present

## 2017-01-27 DIAGNOSIS — M79676 Pain in unspecified toe(s): Secondary | ICD-10-CM | POA: Diagnosis not present

## 2017-01-27 NOTE — Progress Notes (Signed)
She presents today for chief complaint of painful elongated toenails.  Objective: Toenails along thick yellow dystrophic mycotic and painful.  Assessment: Pain and limb secondary to onychomycosis.  Plan: Debridement of toenails 1 through 5 bilateral.

## 2017-01-29 ENCOUNTER — Other Ambulatory Visit: Payer: Self-pay | Admitting: Cardiovascular Disease

## 2017-01-29 NOTE — Telephone Encounter (Signed)
Medication Detail    Disp Refills Start End   carvedilol (COREG) 3.125 MG tablet 180 tablet 3 10/23/2016    Sig - Route: Take 1 tablet (3.125 mg total) by mouth 2 (two) times daily with a meal. - Oral   E-Prescribing Status: Receipt confirmed by pharmacy (10/23/2016 12:57 PM EST)   Pharmacy   [id number] - [place], [place].

## 2017-02-18 DIAGNOSIS — I110 Hypertensive heart disease with heart failure: Secondary | ICD-10-CM | POA: Diagnosis not present

## 2017-02-18 DIAGNOSIS — I509 Heart failure, unspecified: Secondary | ICD-10-CM | POA: Diagnosis not present

## 2017-02-18 DIAGNOSIS — E119 Type 2 diabetes mellitus without complications: Secondary | ICD-10-CM | POA: Diagnosis not present

## 2017-02-18 DIAGNOSIS — Z7982 Long term (current) use of aspirin: Secondary | ICD-10-CM | POA: Diagnosis not present

## 2017-02-18 DIAGNOSIS — Z7984 Long term (current) use of oral hypoglycemic drugs: Secondary | ICD-10-CM | POA: Diagnosis not present

## 2017-02-18 DIAGNOSIS — Z972 Presence of dental prosthetic device (complete) (partial): Secondary | ICD-10-CM | POA: Diagnosis not present

## 2017-02-18 DIAGNOSIS — Z87891 Personal history of nicotine dependence: Secondary | ICD-10-CM | POA: Diagnosis not present

## 2017-02-18 DIAGNOSIS — I209 Angina pectoris, unspecified: Secondary | ICD-10-CM | POA: Diagnosis not present

## 2017-02-18 DIAGNOSIS — Z0000 Encounter for general adult medical examination without abnormal findings: Secondary | ICD-10-CM | POA: Diagnosis not present

## 2017-02-18 DIAGNOSIS — R011 Cardiac murmur, unspecified: Secondary | ICD-10-CM | POA: Diagnosis not present

## 2017-02-18 DIAGNOSIS — E039 Hypothyroidism, unspecified: Secondary | ICD-10-CM | POA: Diagnosis not present

## 2017-02-18 DIAGNOSIS — E7800 Pure hypercholesterolemia, unspecified: Secondary | ICD-10-CM | POA: Diagnosis not present

## 2017-02-18 DIAGNOSIS — Z79899 Other long term (current) drug therapy: Secondary | ICD-10-CM | POA: Diagnosis not present

## 2017-02-18 DIAGNOSIS — Z79890 Hormone replacement therapy (postmenopausal): Secondary | ICD-10-CM | POA: Diagnosis not present

## 2017-02-18 DIAGNOSIS — K08109 Complete loss of teeth, unspecified cause, unspecified class: Secondary | ICD-10-CM | POA: Diagnosis not present

## 2017-02-18 DIAGNOSIS — H409 Unspecified glaucoma: Secondary | ICD-10-CM | POA: Diagnosis not present

## 2017-03-04 DIAGNOSIS — H401113 Primary open-angle glaucoma, right eye, severe stage: Secondary | ICD-10-CM | POA: Diagnosis not present

## 2017-03-04 DIAGNOSIS — H401122 Primary open-angle glaucoma, left eye, moderate stage: Secondary | ICD-10-CM | POA: Diagnosis not present

## 2017-04-13 DIAGNOSIS — E1165 Type 2 diabetes mellitus with hyperglycemia: Secondary | ICD-10-CM | POA: Diagnosis not present

## 2017-04-13 DIAGNOSIS — D485 Neoplasm of uncertain behavior of skin: Secondary | ICD-10-CM | POA: Diagnosis not present

## 2017-04-14 DIAGNOSIS — E1165 Type 2 diabetes mellitus with hyperglycemia: Secondary | ICD-10-CM | POA: Diagnosis not present

## 2017-04-28 ENCOUNTER — Ambulatory Visit (INDEPENDENT_AMBULATORY_CARE_PROVIDER_SITE_OTHER): Payer: Medicare HMO | Admitting: Podiatry

## 2017-04-28 ENCOUNTER — Encounter: Payer: Self-pay | Admitting: Podiatry

## 2017-04-28 DIAGNOSIS — M79676 Pain in unspecified toe(s): Secondary | ICD-10-CM

## 2017-04-28 DIAGNOSIS — B351 Tinea unguium: Secondary | ICD-10-CM

## 2017-04-28 NOTE — Progress Notes (Signed)
She presents today with chief complaint of painful elongated toenails.  Objective: Vital signs are stable she's alert and oriented 3. Pulses are palpable. No open lesions or wounds are noted. Her toenails were elongated kilo dystrophic onychomycotic sharply incurvated and painful palpation as well as debridement. Nails 1 through 10 are affected.  Assessment: Pain in limb secondary to onychomycosis.  Plan: Debridement of toenails 1 through 5 bilateral. Follow up with her in 3 months

## 2017-06-23 ENCOUNTER — Encounter: Payer: Self-pay | Admitting: Physician Assistant

## 2017-06-23 ENCOUNTER — Ambulatory Visit (INDEPENDENT_AMBULATORY_CARE_PROVIDER_SITE_OTHER): Payer: Medicare HMO | Admitting: Physician Assistant

## 2017-06-23 VITALS — BP 124/52 | HR 58 | Ht 63.0 in | Wt 153.6 lb

## 2017-06-23 DIAGNOSIS — E785 Hyperlipidemia, unspecified: Secondary | ICD-10-CM | POA: Diagnosis not present

## 2017-06-23 DIAGNOSIS — I2510 Atherosclerotic heart disease of native coronary artery without angina pectoris: Secondary | ICD-10-CM

## 2017-06-23 DIAGNOSIS — I340 Nonrheumatic mitral (valve) insufficiency: Secondary | ICD-10-CM

## 2017-06-23 DIAGNOSIS — I130 Hypertensive heart and chronic kidney disease with heart failure and stage 1 through stage 4 chronic kidney disease, or unspecified chronic kidney disease: Secondary | ICD-10-CM | POA: Diagnosis not present

## 2017-06-23 DIAGNOSIS — N184 Chronic kidney disease, stage 4 (severe): Secondary | ICD-10-CM | POA: Diagnosis not present

## 2017-06-23 DIAGNOSIS — I272 Pulmonary hypertension, unspecified: Secondary | ICD-10-CM

## 2017-06-23 DIAGNOSIS — I5042 Chronic combined systolic (congestive) and diastolic (congestive) heart failure: Secondary | ICD-10-CM

## 2017-06-23 NOTE — Progress Notes (Signed)
Cardiology Office Note:    Date:  06/23/2017   ID:  Mandy Larson, DOB 1930-02-02, MRN [id number]  PCP:  [name], MD  Cardiologist:  Dr. Liam Rogers    Referring MD: [name], MD   Chief Complaint  Patient presents with  . Congestive Heart Failure    Follow up    History of Present Illness:    Mandy Larson is a 83 y.o. female with a hx of CAD, chronic combined systolic and diastolic CHF, severe mitral regurgitation, HTN, HL, diabetes, CKD. The patient was admitted with an acute coronary syndrome in 12/13. An echocardiogram demonstrated an EF of 35% with inferior akinesis and severe mitral regurgitation and moderate pulmonary hypertension. Cardiac catheterization was not performed secondary to CKD. She has been managed medically. Last seen by Dr. Acie Fredrickson 8/17.    [name] returns for follow-up. She is here alone. She notes that she does get short of breath with some activities. She sleeps on 2 pillows chronically. She denies paroxysmal nocturnal dyspnea. She denies edema. She denies chest discomfort or syncope. She occasionally has a nonproductive cough. She denies increasing abdominal girth or increasing weight.  Prior CV studies:   The following studies were reviewed today:  Echo 12/13 EF 35-40, inferior and inferoseptal akinesis, grade 2 diastolic dysfunction, MAC, dysfunction/tethering of the posterior papillary muscle as a result of inferior infarct, severe MR, moderate PI, PASP 50, small circumferential pericardial effusion without hemodynamic compromise  Past Medical History:  Diagnosis Date  . Anemia   . CAD (coronary artery disease) 06/26/2014   admx with NSTEMI in 12/13 >> tx medically due to CKD  . Chronic combined systolic and diastolic CHF (congestive heart failure) ([place]) 11/08/2012   Echo 12/13:  EF 35-40, inferior and inferoseptal akinesis, grade 2 diastolic dysfunction, MAC, dysfunction/tethering of the posterior papillary muscle as a result of inferior  infarct, severe MR, moderate PI, PASP 50, small circumferential pericardial effusion without hemodynamic compromise  . CKD (chronic kidney disease)   . Diabetes mellitus without complication ([place])   . Former smoker   . Hyperlipidemia   . Hypertension   . Pulmonary hypertension ([place]) 11/08/2012  . Severe mitral regurgitation 11/08/2012   Due to papillary muscle dysfunction from prior inf infarct    Past Surgical History:  Procedure Laterality Date  . EYE SURGERY      Current Medications: Current Meds  Medication Sig  . amLODipine (NORVASC) 5 MG tablet Take 5 mg by mouth daily.  Marland Kitchen aspirin EC 81 MG tablet Take 81 mg by mouth daily.  . Calcium Carbonate (CALTRATE 600 PO) Take 600 mg by mouth daily.   . carvedilol (COREG) 3.125 MG tablet Take 1 tablet (3.125 mg total) by mouth 2 (two) times daily with a meal.  . dorzolamide-timolol (COSOPT) 22.3-6.8 MG/ML ophthalmic solution Place 1 drop into both eyes at bedtime.   . furosemide (LASIX) 20 MG tablet Take 1 tablet (20 mg total) by mouth daily.  Marland Kitchen glipiZIDE (GLUCOTROL) 5 MG tablet TAKE 1/2 TABLET BY MOUTH ONE TIME DAILY  . isosorbide mononitrate (IMDUR) 30 MG 24 hr tablet Take 1 tablet (30 mg total) by mouth daily.  Marland Kitchen latanoprost (XALATAN) 0.005 % ophthalmic solution Place 1 drop into both eyes 2 (two) times daily.   Marland Kitchen levothyroxine (SYNTHROID, LEVOTHROID) 50 MCG tablet Take 50 mcg by mouth daily.  Marland Kitchen lisinopril (PRINIVIL,ZESTRIL) 5 MG tablet Take 5 mg by mouth daily.  . Multiple Vitamins-Minerals (CENTRUM SILVER PO) Take 1 tablet by mouth daily.  Dose Unknown  . simvastatin (ZOCOR) 20 MG tablet Take 20 mg by mouth every evening.     Allergies:   Patient has no known allergies.   Social History   Social History  . Marital status: Widowed    Spouse name: N/A  . Number of children: N/A  . Years of education: N/A   Social History Main Topics  . Smoking status: Former Smoker    Types: Cigarettes    Quit date: 11/09/1999  .  Smokeless tobacco: Never Used  . Alcohol use No  . Drug use: No  . Sexual activity: No   Other Topics Concern  . None   Social History Narrative  . None     Family Hx: The patient's Family history is unknown by patient.  ROS:   Please see the history of present illness.    Review of Systems  Neurological: Positive for loss of balance.   All other systems reviewed and are negative.   EKGs/Labs/Other Test Reviewed:    EKG:  EKG is  ordered today.  The ekg ordered today demonstrates Sinus bradycardia, HR 57, right bundle branch block, left axis deviation, IVCD, QTc 488 ms, no change since prior tracing  Recent Labs: 07/07/2016: ALT 14; BUN 41; Creat 2.89; Potassium 4.3; Sodium 141   Recent Lipid Panel Lab Results  Component Value Date/Time   CHOL 142 07/07/2016 10:19 AM   TRIG 42 07/07/2016 10:19 AM   HDL 97 07/07/2016 10:19 AM   CHOLHDL 1.5 07/07/2016 10:19 AM   LDLCALC 37 07/07/2016 10:19 AM    Physical Exam:    VS:  BP (!) 124/52   Pulse (!) 58   Ht 5' 3"  (1.6 m)   Wt 153 lb 9.6 oz (69.7 kg)   SpO2 94%   BMI 27.21 kg/m     Wt Readings from Last 3 Encounters:  06/23/17 153 lb 9.6 oz (69.7 kg)  07/07/16 158 lb 6.4 oz (71.8 kg)  07/02/15 160 lb (72.6 kg)     Physical Exam  Constitutional: She is oriented to person, place, and time. She appears well-developed and well-nourished. No distress.  HENT:  Head: Normocephalic and atraumatic.  Eyes: No scleral icterus.  Neck: No JVD present. Carotid bruit is not present.  Cardiovascular: Normal rate, regular rhythm and normal heart sounds.   No murmur heard. Pulmonary/Chest: Effort normal. She has no rales.  Abdominal: Soft. There is no tenderness.  Musculoskeletal: She exhibits no edema.  Neurological: She is alert and oriented to person, place, and time.  Skin: Skin is warm and dry.  Psychiatric: She has a normal mood and affect.    ASSESSMENT:    1. Chronic combined systolic and diastolic CHF (congestive  heart failure) ([place])   2. Hypertensive heart and chronic kidney disease with heart failure and stage 1 through stage 4 chronic kidney disease, or chronic kidney disease ([place])   3. Coronary artery disease involving native coronary artery of native heart without angina pectoris   4. Severe mitral regurgitation   5. Pulmonary hypertension ([place])   6. CKD (chronic kidney disease) stage 4, GFR 15-29 ml/min (HCC)   7. Hyperlipidemia, unspecified hyperlipidemia type    PLAN:    In order of problems listed above:  1. Chronic combined systolic and diastolic CHF (congestive heart failure) (HCC) NYHA 2b. Overall, her volume appears to be stable. Continue current medical regimen which includes carvedilol, isosorbide, lisinopril, furosemide.  2. Hypertensive heart and chronic kidney disease with heart failure and  stage 1 through stage 4 chronic kidney disease, or chronic kidney disease ([place]) The patient's blood pressure is controlled on her current regimen.  Continue current therapy.    3. Coronary artery disease involving native coronary artery of native heart without angina pectoris -  Presumed based upon admission with non-STEMI in 2013, reduced EF and wall motion abnormalities on echocardiogram. She was treated medically due to chronic kidney disease. She denies symptoms of angina. Continue aspirin, amlodipine, nitrates, statin.  4. Severe mitral regurgitation Secondary to papillary muscle dysfunction from prior inferior infarct. Her shortness of breath is overall stale.  5. Pulmonary hypertension ([place]) As noted, her breathing has remained stable.  6. CKD (chronic kidney disease) stage 4, GFR 15-29 ml/min (HCC) -  Creatinine has been 2.89-2.96 over the past 2 years.  She was actually surprised when I inquired about her chronic kidney disease. She was not aware that she had any kidney problems. She will continue follow-up with primary care.  7. Hyperlipidemia LDL 37 in 8/17.  Arrange follow-up  CMET, lipids.   Dispo:  Return in about 1 year (around 06/23/2018) for Routine Follow Up, w/ Dr. Acie Fredrickson.   Medication Adjustments/Labs and Tests Ordered: Current medicines are reviewed at length with the patient today.  Concerns regarding medicines are outlined above.  Tests Ordered: Orders Placed This Encounter  Procedures  . Comp Met (CMET)  . Lipid panel  . EKG 12-Lead   Medication Changes: No orders of the defined types were placed in this encounter.   Signed, Richardson Dopp, PA-C  06/23/2017 [facility] [place], [place], [place]  [address] Phone: [phone]; Fax: [phone]

## 2017-06-23 NOTE — Patient Instructions (Signed)
Medication Instructions:  1. Your physician recommends that you continue on your current medications as directed. Please refer to the Current Medication list given to you today.   Labwork: IN 1 MONTH FASTING CMET AND LIPID PANEL  Testing/Procedures: NONE ORDERED   Follow-Up: Your physician wants you to follow-up in: [place] Acie Fredrickson.  You will receive a reminder letter in the mail two months in advance. If you don't receive a letter, please call our office to schedule the follow-up appointment.   Any Other Special Instructions Will Be Listed Below (If Applicable).     If you need a refill on your cardiac medications before your next appointment, please call your pharmacy.

## 2017-07-02 DIAGNOSIS — H401122 Primary open-angle glaucoma, left eye, moderate stage: Secondary | ICD-10-CM | POA: Diagnosis not present

## 2017-07-02 DIAGNOSIS — H401113 Primary open-angle glaucoma, right eye, severe stage: Secondary | ICD-10-CM | POA: Diagnosis not present

## 2017-07-24 ENCOUNTER — Inpatient Hospital Stay (HOSPITAL_COMMUNITY)
Admission: EM | Admit: 2017-07-24 | Discharge: 2017-07-26 | DRG: 378 | Disposition: A | Discharge status: Home / Self Care | Payer: Medicare HMO | Attending: Internal Medicine | Admitting: Internal Medicine

## 2017-07-24 ENCOUNTER — Encounter (HOSPITAL_COMMUNITY): Payer: Self-pay | Admitting: *Deleted

## 2017-07-24 ENCOUNTER — Emergency Department (HOSPITAL_COMMUNITY): Payer: Medicare HMO

## 2017-07-24 ENCOUNTER — Inpatient Hospital Stay (HOSPITAL_COMMUNITY): Payer: Medicare HMO

## 2017-07-24 DIAGNOSIS — I9589 Other hypotension: Secondary | ICD-10-CM | POA: Diagnosis present

## 2017-07-24 DIAGNOSIS — I5042 Chronic combined systolic (congestive) and diastolic (congestive) heart failure: Secondary | ICD-10-CM | POA: Diagnosis not present

## 2017-07-24 DIAGNOSIS — I951 Orthostatic hypotension: Secondary | ICD-10-CM

## 2017-07-24 DIAGNOSIS — E785 Hyperlipidemia, unspecified: Secondary | ICD-10-CM | POA: Diagnosis present

## 2017-07-24 DIAGNOSIS — Z7984 Long term (current) use of oral hypoglycemic drugs: Secondary | ICD-10-CM

## 2017-07-24 DIAGNOSIS — K259 Gastric ulcer, unspecified as acute or chronic, without hemorrhage or perforation: Secondary | ICD-10-CM | POA: Diagnosis not present

## 2017-07-24 DIAGNOSIS — I5083 High output heart failure: Secondary | ICD-10-CM | POA: Diagnosis present

## 2017-07-24 DIAGNOSIS — N189 Chronic kidney disease, unspecified: Secondary | ICD-10-CM

## 2017-07-24 DIAGNOSIS — K921 Melena: Secondary | ICD-10-CM

## 2017-07-24 DIAGNOSIS — I252 Old myocardial infarction: Secondary | ICD-10-CM | POA: Diagnosis not present

## 2017-07-24 DIAGNOSIS — I959 Hypotension, unspecified: Secondary | ICD-10-CM | POA: Diagnosis present

## 2017-07-24 DIAGNOSIS — Z87891 Personal history of nicotine dependence: Secondary | ICD-10-CM | POA: Diagnosis not present

## 2017-07-24 DIAGNOSIS — K222 Esophageal obstruction: Secondary | ICD-10-CM | POA: Diagnosis not present

## 2017-07-24 DIAGNOSIS — I340 Nonrheumatic mitral (valve) insufficiency: Secondary | ICD-10-CM | POA: Diagnosis present

## 2017-07-24 DIAGNOSIS — N289 Disorder of kidney and ureter, unspecified: Secondary | ICD-10-CM

## 2017-07-24 DIAGNOSIS — E119 Type 2 diabetes mellitus without complications: Secondary | ICD-10-CM

## 2017-07-24 DIAGNOSIS — N184 Chronic kidney disease, stage 4 (severe): Secondary | ICD-10-CM

## 2017-07-24 DIAGNOSIS — R55 Syncope and collapse: Secondary | ICD-10-CM

## 2017-07-24 DIAGNOSIS — D631 Anemia in chronic kidney disease: Secondary | ICD-10-CM | POA: Diagnosis present

## 2017-07-24 DIAGNOSIS — E1122 Type 2 diabetes mellitus with diabetic chronic kidney disease: Secondary | ICD-10-CM | POA: Diagnosis present

## 2017-07-24 DIAGNOSIS — R079 Chest pain, unspecified: Secondary | ICD-10-CM | POA: Diagnosis not present

## 2017-07-24 DIAGNOSIS — E039 Hypothyroidism, unspecified: Secondary | ICD-10-CM | POA: Diagnosis present

## 2017-07-24 DIAGNOSIS — I2510 Atherosclerotic heart disease of native coronary artery without angina pectoris: Secondary | ICD-10-CM

## 2017-07-24 DIAGNOSIS — I272 Pulmonary hypertension, unspecified: Secondary | ICD-10-CM | POA: Diagnosis not present

## 2017-07-24 DIAGNOSIS — I130 Hypertensive heart and chronic kidney disease with heart failure and stage 1 through stage 4 chronic kidney disease, or unspecified chronic kidney disease: Secondary | ICD-10-CM | POA: Diagnosis present

## 2017-07-24 DIAGNOSIS — Z7982 Long term (current) use of aspirin: Secondary | ICD-10-CM | POA: Diagnosis not present

## 2017-07-24 DIAGNOSIS — R531 Weakness: Secondary | ICD-10-CM | POA: Diagnosis not present

## 2017-07-24 DIAGNOSIS — D62 Acute posthemorrhagic anemia: Secondary | ICD-10-CM | POA: Diagnosis not present

## 2017-07-24 DIAGNOSIS — K449 Diaphragmatic hernia without obstruction or gangrene: Secondary | ICD-10-CM | POA: Diagnosis present

## 2017-07-24 DIAGNOSIS — I4510 Unspecified right bundle-branch block: Secondary | ICD-10-CM | POA: Diagnosis present

## 2017-07-24 DIAGNOSIS — H409 Unspecified glaucoma: Secondary | ICD-10-CM | POA: Diagnosis present

## 2017-07-24 DIAGNOSIS — Z8249 Family history of ischemic heart disease and other diseases of the circulatory system: Secondary | ICD-10-CM | POA: Diagnosis not present

## 2017-07-24 DIAGNOSIS — K250 Acute gastric ulcer with hemorrhage: Principal | ICD-10-CM | POA: Diagnosis present

## 2017-07-24 LAB — COMPREHENSIVE METABOLIC PANEL
ALT: 12 U/L — AB (ref 14–54)
ANION GAP: 8 (ref 5–15)
AST: 20 U/L (ref 15–41)
Albumin: 3 g/dL — ABNORMAL LOW (ref 3.5–5.0)
Alkaline Phosphatase: 25 U/L — ABNORMAL LOW (ref 38–126)
BUN: 108 mg/dL — ABNORMAL HIGH (ref 6–20)
CHLORIDE: 107 mmol/L (ref 101–111)
CO2: 22 mmol/L (ref 22–32)
CREATININE: 3.88 mg/dL — AB (ref 0.44–1.00)
Calcium: 10.1 mg/dL (ref 8.9–10.3)
GFR, EST AFRICAN AMERICAN: 11 mL/min — AB (ref >60)
GFR, EST NON AFRICAN AMERICAN: 10 mL/min — AB (ref >60)
Glucose, Bld: 200 mg/dL — ABNORMAL HIGH (ref 65–99)
Potassium: 3.8 mmol/L (ref 3.5–5.1)
SODIUM: 137 mmol/L (ref 135–145)
Total Bilirubin: 0.3 mg/dL (ref 0.3–1.2)
Total Protein: 5.8 g/dL — ABNORMAL LOW (ref 6.5–8.1)

## 2017-07-24 LAB — GLUCOSE, CAPILLARY
GLUCOSE-CAPILLARY: 170 mg/dL — AB (ref 65–99)
GLUCOSE-CAPILLARY: 160 mg/dL — AB (ref 65–99)

## 2017-07-24 LAB — ECHOCARDIOGRAM COMPLETE
Ao-asc: 24 cm
CHL CUP DOP CALC LVOT VTI: 34.6 cm
CHL CUP MV DEC (S): 275
EWDT: 275 ms
FS: 28 % (ref 28–44)
Height: 63 in
IV/PV OW: 1.3
LA ID, A-P, ES: 39 mm
LA diam end sys: 39 mm
LA vol A4C: 48.7 ml
LA vol index: 32.1 mL/m2
LA vol: 56.8 mL
LADIAMINDEX: 2.21 cm/m2
LV PW d: 10 mm — AB (ref 0.6–1.1)
LV TDI E'MEDIAL: 3.15
LV e' LATERAL: 4.88 cm/s
LVOT SV: 70 mL
LVOT area: 2.01 cm2
LVOT peak grad rest: 10 mmHg
LVOTD: 16 mm
LVOTPV: 161 cm/s
MV pk E vel: 0.7 m/s
PV Reg grad dias: 7 mmHg
PV Reg vel dias: 136 cm/s
RV LATERAL S' VELOCITY: 9.57 cm/s
TAPSE: 34.1 mm
TDI e' lateral: 4.88
Weight: 2432 oz

## 2017-07-24 LAB — PREPARE RBC (CROSSMATCH)

## 2017-07-24 LAB — CBC
HCT: 15 % — ABNORMAL LOW (ref 36.0–46.0)
HEMOGLOBIN: 4.6 g/dL — AB (ref 12.0–15.0)
MCH: 27.1 pg (ref 26.0–34.0)
MCHC: 30.7 g/dL (ref 30.0–36.0)
MCV: 88.2 fL (ref 78.0–100.0)
PLATELETS: 193 10*3/uL (ref 150–400)
RBC: 1.7 MIL/uL — ABNORMAL LOW (ref 3.87–5.11)
RDW: 16.4 % — ABNORMAL HIGH (ref 11.5–15.5)
WBC: 7.2 10*3/uL (ref 4.0–10.5)

## 2017-07-24 LAB — ABO/RH: ABO/RH(D): O POS

## 2017-07-24 LAB — TROPONIN I
TROPONIN I: 0.06 ng/mL — AB (ref <0.03)
Troponin I: 0.06 ng/mL (ref <0.03)

## 2017-07-24 LAB — BRAIN NATRIURETIC PEPTIDE: B Natriuretic Peptide: 115.6 pg/mL — ABNORMAL HIGH (ref 0.0–100.0)

## 2017-07-24 LAB — HEMOGLOBIN AND HEMATOCRIT, BLOOD
HEMATOCRIT: 21.2 % — AB (ref 36.0–46.0)
HEMOGLOBIN: 6.9 g/dL — AB (ref 12.0–15.0)

## 2017-07-24 LAB — CBG MONITORING, ED: GLUCOSE-CAPILLARY: 202 mg/dL — AB (ref 65–99)

## 2017-07-24 LAB — POC OCCULT BLOOD, ED: FECAL OCCULT BLD: POSITIVE — AB

## 2017-07-24 LAB — I-STAT TROPONIN, ED: Troponin i, poc: 0.05 ng/mL (ref 0.00–0.08)

## 2017-07-24 LAB — HEMOGLOBIN A1C
Hgb A1c MFr Bld: 5.7 % — ABNORMAL HIGH (ref 4.8–5.6)
Mean Plasma Glucose: 116.89 mg/dL

## 2017-07-24 LAB — TSH: TSH: 0.943 u[IU]/mL (ref 0.350–4.500)

## 2017-07-24 MED ORDER — INSULIN ASPART 100 UNIT/ML ~~LOC~~ SOLN: 0.0000 [IU] | Freq: Every day | SUBCUTANEOUS | Status: DC

## 2017-07-24 MED ORDER — SODIUM CHLORIDE 0.9 % IV SOLN
Freq: Once | INTRAVENOUS | Status: AC
  Administered 2017-07-25: 05:00:00 via INTRAVENOUS

## 2017-07-24 MED ORDER — ONDANSETRON HCL 4 MG/2ML IJ SOLN: 4.0000 mg | Freq: Four times a day (QID) | INTRAMUSCULAR | Status: DC | PRN

## 2017-07-24 MED ORDER — FUROSEMIDE 10 MG/ML IJ SOLN
20.0000 mg | Freq: Four times a day (QID) | INTRAMUSCULAR | Status: DC
  Administered 2017-07-24 – 2017-07-26 (×8): 20 mg via INTRAVENOUS
  Filled 2017-07-24 (×8): qty 2

## 2017-07-24 MED ORDER — SODIUM CHLORIDE 0.9 % IV SOLN
Freq: Once | INTRAVENOUS | Status: AC
  Administered 2017-07-24: 12:00:00 via INTRAVENOUS

## 2017-07-24 MED ORDER — LATANOPROST 0.005 % OP SOLN
1.0000 [drp] | Freq: Two times a day (BID) | OPHTHALMIC | Status: DC
  Filled 2017-07-24: qty 2.5

## 2017-07-24 MED ORDER — SODIUM CHLORIDE 0.9 % IV SOLN: Freq: Once | INTRAVENOUS | Status: DC

## 2017-07-24 MED ORDER — SODIUM CHLORIDE 0.9 % IV SOLN: 250.0000 mL | INTRAVENOUS | Status: DC | PRN

## 2017-07-24 MED ORDER — ACETAMINOPHEN 325 MG PO TABS: 650.0000 mg | ORAL_TABLET | Freq: Four times a day (QID) | ORAL | Status: DC | PRN

## 2017-07-24 MED ORDER — ACETAMINOPHEN 650 MG RE SUPP: 650.0000 mg | Freq: Four times a day (QID) | RECTAL | Status: DC | PRN

## 2017-07-24 MED ORDER — SODIUM CHLORIDE 0.9% FLUSH: 3.0000 mL | INTRAVENOUS | Status: DC | PRN

## 2017-07-24 MED ORDER — LATANOPROST 0.005 % OP SOLN
1.0000 [drp] | Freq: Every day | OPHTHALMIC | Status: DC
  Administered 2017-07-24 – 2017-07-25 (×2): 1 [drp] via OPHTHALMIC

## 2017-07-24 MED ORDER — SODIUM CHLORIDE 0.9% FLUSH
3.0000 mL | Freq: Two times a day (BID) | INTRAVENOUS | Status: DC
  Administered 2017-07-24 – 2017-07-26 (×5): 3 mL via INTRAVENOUS

## 2017-07-24 MED ORDER — PANTOPRAZOLE SODIUM 40 MG IV SOLR
40.0000 mg | Freq: Two times a day (BID) | INTRAVENOUS | Status: DC
  Administered 2017-07-24 – 2017-07-26 (×5): 40 mg via INTRAVENOUS
  Filled 2017-07-24 (×5): qty 40

## 2017-07-24 MED ORDER — DORZOLAMIDE HCL-TIMOLOL MAL 2-0.5 % OP SOLN
1.0000 [drp] | Freq: Two times a day (BID) | OPHTHALMIC | Status: DC
  Administered 2017-07-24 – 2017-07-26 (×4): 1 [drp] via OPHTHALMIC

## 2017-07-24 MED ORDER — LEVOTHYROXINE SODIUM 50 MCG PO TABS
50.0000 ug | ORAL_TABLET | Freq: Every day | ORAL | Status: DC
  Administered 2017-07-25 – 2017-07-26 (×2): 50 ug via ORAL
  Filled 2017-07-24 (×2): qty 1

## 2017-07-24 MED ORDER — SIMVASTATIN 20 MG PO TABS
20.0000 mg | ORAL_TABLET | Freq: Every evening | ORAL | Status: DC
  Administered 2017-07-24 – 2017-07-25 (×2): 20 mg via ORAL
  Filled 2017-07-24 (×3): qty 1

## 2017-07-24 MED ORDER — ONDANSETRON HCL 4 MG PO TABS: 4.0000 mg | ORAL_TABLET | Freq: Four times a day (QID) | ORAL | Status: DC | PRN

## 2017-07-24 MED ORDER — DORZOLAMIDE HCL-TIMOLOL MAL 2-0.5 % OP SOLN
1.0000 [drp] | Freq: Two times a day (BID) | OPHTHALMIC | Status: DC
  Filled 2017-07-24: qty 10

## 2017-07-24 MED ORDER — INSULIN ASPART 100 UNIT/ML ~~LOC~~ SOLN
0.0000 [IU] | Freq: Three times a day (TID) | SUBCUTANEOUS | Status: DC
  Administered 2017-07-24: 2 [IU] via SUBCUTANEOUS
  Administered 2017-07-25: 1 [IU] via SUBCUTANEOUS
  Administered 2017-07-26: 2 [IU] via SUBCUTANEOUS

## 2017-07-24 NOTE — H&P (Signed)
History and Physical    Mandy Larson OZY:[id number] DOB: 09-12-1930 DOA: 07/24/2017  PCP: [name], MD Patient coming from: home  Chief Complaint: fatigue/chest pain/sob/melena  HPI: Mandy Larson is a delightful 83 y.o. female with medical history significant for CAD, chronic combined systolic diastolic heart failure, severe mitral regurg, hypertension, diabetes, chronic kidney disease presents to the emergency Department chief complaint gradual worsening fatigue, generalized weakness, shortness of breath with exertion, chest pain and melena. Initial evaluation reveals a hemoglobin of 4.8 creatinine of 3.8 systolic blood pressure of 86 heme + stool. [facility] called to admit  Information is obtained from the patient and her granddaughter is at the bedside as well as the chart. Patient states that over the last 3 days she has experienced gradual worsening generalized weakness dyspnea with exertion and chest pain. He also reports melena for the last 3 days. She describes the chest pain is intermittent sharp located epigastric area worse with activity. She states it does not radiate. She denies worsening lower extremity edema but does endorse orthopnea. Reportedly she was found leaning over in the wheelchair in the waiting room. Several syncopal event. She arouses to verbal stimuli. She denies headache dizziness. She denies abdominal pain nausea vomiting diarrhea constipation. She denies dysuria hematuria frequency or urgency. She denies fever chills recent travel or sick contacts. He denies NSAID use she does take a low-dose aspirin. She believes she had a colonoscopy "many many years ago" and believes it is "okay".  ED Course: Emergency department is afebrile with a systolic blood pressure of 86 heart rate 58 she's not hypoxic. She's provided with IV fluids and typed and crossed for blood transfusion. He is nontoxic appearing  Review of Systems: As per HPI otherwise all other systems  reviewed and are negative.   Ambulatory Status: Lives at home with her son. Ambulates with a steady gait. No recent falls. Fairly independent with ADLs  Past Medical History:  Diagnosis Date  . Anemia   . CAD (coronary artery disease) 06/26/2014   admx with NSTEMI in 12/13 >> tx medically due to CKD  . Chronic combined systolic and diastolic CHF (congestive heart failure) ([place]) 11/08/2012   Echo 12/13:  EF 35-40, inferior and inferoseptal akinesis, grade 2 diastolic dysfunction, MAC, dysfunction/tethering of the posterior papillary muscle as a result of inferior infarct, severe MR, moderate PI, PASP 50, small circumferential pericardial effusion without hemodynamic compromise  . CKD (chronic kidney disease)   . Diabetes mellitus without complication ([place])   . Former smoker   . Hyperlipidemia   . Hypertension   . Pulmonary hypertension ([place]) 11/08/2012  . Severe mitral regurgitation 11/08/2012   Due to papillary muscle dysfunction from prior inf infarct    Past Surgical History:  Procedure Laterality Date  . EYE SURGERY      Social History   Social History  . Marital status: Widowed    Spouse name: N/A  . Number of children: N/A  . Years of education: N/A   Occupational History  . Not on file.   Social History Main Topics  . Smoking status: Former Smoker    Types: Cigarettes    Quit date: 11/09/1999  . Smokeless tobacco: Never Used  . Alcohol use No  . Drug use: No  . Sexual activity: No   Other Topics Concern  . Not on file   Social History Narrative  . No narrative on file    No Known Allergies  Family History  Problem Relation Age of  Onset  . Family history unknown: Yes    Prior to Admission medications   Medication Sig Start Date End Date Taking? Authorizing Provider  amLODipine (NORVASC) 5 MG tablet Take 5 mg by mouth daily. 04/29/17  Yes [provider]  aspirin EC 81 MG tablet Take 81 mg by mouth daily.   Yes [provider]    Calcium Carb-Cholecalciferol (CALTRATE 600+D) 600-800 MG-UNIT TABS Take 1 tablet by mouth daily.   Yes [provider]  carvedilol (COREG) 3.125 MG tablet Take 1 tablet (3.125 mg total) by mouth 2 (two) times daily with a meal. 10/23/16  Yes Nahser, Wonda Cheng, MD  dorzolamide-timolol (COSOPT) 22.3-6.8 MG/ML ophthalmic solution Place 1 drop into both eyes 2 (two) times daily.  06/20/17  Yes [provider]  furosemide (LASIX) 20 MG tablet Take 1 tablet (20 mg total) by mouth daily. 12/14/12  Yes Nahser, Wonda Cheng, MD  glipiZIDE (GLUCOTROL) 5 MG tablet Take 2.5 mg by mouth daily before breakfast. TAKE 1/2 TABLET BY MOUTH ONE TIME DAILY   Yes [provider]  isosorbide mononitrate (IMDUR) 30 MG 24 hr tablet Take 1 tablet (30 mg total) by mouth daily. 02/28/13  Yes Nahser, Wonda Cheng, MD  latanoprost (XALATAN) 0.005 % ophthalmic solution Place 1 drop into both eyes 2 (two) times daily.  06/15/17  Yes [provider]  levothyroxine (SYNTHROID, LEVOTHROID) 50 MCG tablet Take 50 mcg by mouth daily.   Yes [provider]  lisinopril (PRINIVIL,ZESTRIL) 5 MG tablet Take 5 mg by mouth daily. 04/15/17  Yes [provider]  Multiple Vitamins-Minerals (CENTRUM SILVER PO) Take 1 tablet by mouth daily. Dose Unknown   Yes [provider]  simvastatin (ZOCOR) 20 MG tablet Take 20 mg by mouth every evening.   Yes [provider]    Physical Exam: Vitals:   07/24/17 1130 07/24/17 1200 07/24/17 1201 07/24/17 1223  BP: (!) 106/48 (!) 101/45 (!) 101/45 106/82  Pulse: 64 (!) 59 60 61  Resp:   (!) 24 18  Temp:   97.6 F (36.4 C) 97.7 F (36.5 C)  TempSrc:   Oral Oral  SpO2: 99% 100%  98%  Weight:      Height:         General:  Appears calm and comfortable, sitting up in bed in no acute distress Eyes:  PERRL, EOMI, normal lids, iris ENT:  grossly normal hearing, lips & tongue, his membranes of her mouth are slightly pale only slightly dry Neck:  no  LAD, masses or thyromegaly Cardiovascular:  RRR, +murmur. Trace bilateral lower extremity edema Respiratory:  CTA bilaterally, no w/r/r. Normal respiratory effort. Abdomen:  soft, ntnd, positive bowel sounds throughout no guarding or rebounding Skin:  no rash or induration seen on limited exam Musculoskeletal:  grossly normal tone BUE/BLE, good ROM, no bony abnormality Psychiatric:  grossly normal mood and affect, speech fluent and appropriate, AOx3 Neurologic:  CN 2-12 grossly intact, moves all extremities in coordinated fashion, sensation intact  Labs on Admission: I have personally reviewed following labs and imaging studies  CBC:  Recent Labs Lab 07/24/17 1101  WBC 7.2  HGB 4.6*  HCT 15.0*  MCV 88.2  PLT 818   Basic Metabolic Panel:  Recent Labs Lab 07/24/17 1101  NA 137  K 3.8  CL 107  CO2 22  GLUCOSE 200*  BUN 108*  CREATININE 3.88*  CALCIUM 10.1   GFR: Estimated Creatinine Clearance: 9.9 mL/min (A) (by C-G formula based on SCr  of 3.88 mg/dL (H)). Liver Function Tests:  Recent Labs Lab 07/24/17 1101  AST 20  ALT 12*  ALKPHOS 25*  BILITOT 0.3  PROT 5.8*  ALBUMIN 3.0*   No results for input(s): LIPASE, AMYLASE in the last 168 hours. No results for input(s): AMMONIA in the last 168 hours. Coagulation Profile: No results for input(s): INR, PROTIME in the last 168 hours. Cardiac Enzymes: No results for input(s): CKTOTAL, CKMB, CKMBINDEX, TROPONINI in the last 168 hours. BNP (last 3 results) No results for input(s): PROBNP in the last 8760 hours. HbA1C: No results for input(s): HGBA1C in the last 72 hours. CBG:  Recent Labs Lab 07/24/17 1050  GLUCAP 202*   Lipid Profile: No results for input(s): CHOL, HDL, LDLCALC, TRIG, CHOLHDL, LDLDIRECT in the last 72 hours. Thyroid Function Tests: No results for input(s): TSH, T4TOTAL, FREET4, T3FREE, THYROIDAB in the last 72 hours. Anemia Panel: No results for input(s): VITAMINB12, FOLATE, FERRITIN, TIBC,  IRON, RETICCTPCT in the last 72 hours. Urine analysis: No results found for: COLORURINE, APPEARANCEUR, LABSPEC, PHURINE, GLUCOSEU, HGBUR, BILIRUBINUR, KETONESUR, PROTEINUR, UROBILINOGEN, NITRITE, LEUKOCYTESUR  Creatinine Clearance: Estimated Creatinine Clearance: 9.9 mL/min (A) (by C-G formula based on SCr of 3.88 mg/dL (H)).  Sepsis Labs: _0 (procalcitonin:4,lacticidven:4) )No results found for this or any previous visit (from the past 240 hour(s)).   Radiological Exams on Admission: Dg Chest Port 1 View  Result Date: 07/24/2017 CLINICAL DATA:  Chest pain, weakness EXAM: PORTABLE CHEST 1 VIEW COMPARISON:  None. FINDINGS: Cardiomegaly. No confluent airspace opacities or effusions. Mild hyperinflation. No acute bony abnormality. IMPRESSION: Hyperinflation/COPD.  Cardiomegaly.  No active disease. Electronically Signed   By: Rolm Baptise M.D.   On: 07/24/2017 11:34    EKG: Independently reviewed. Sinus rhythm RBBB and LAFB LVH with secondary repolarization abnormality Baseline wander in lead(s) V5  Assessment/Plan Principal Problem:   Acute blood loss anemia Active Problems:   Chest pain   DM2 (diabetes mellitus, type 2) (HCC)   CKD (chronic kidney disease) stage 4, GFR 15-29 ml/min ([facility])   Chronic combined systolic and diastolic CHF (congestive heart failure) ([facility])   CAD (coronary artery disease)   Melena   Hypotension   Syncope   #1. Acute blood loss anemia/melena/GI bleed. Hemoglobin 4.6 on presentation. Most recent previous hemoglobin and chart from 2014 8.6. Stool heme positive in the emergency department. As NSAID use. Review indicates colonoscopy 2006 reveals 1 polyp that was removed no other abnormalities. Gastroenterology contacted by emergency provider -Admit to telemetry -Transfuse 2 units packed red blood cells -Serial CBCs -Nothing by mouth for now -SCDs -Await GI recommendations  #2. Hypotension. Upon presentation systolic blood pressure 86. Likely related to  #1. She was provided with IV fluids upon admission systolic blood pressure 176. Home medications include Norvasc, Coreg, Lasix, imdur and lisinopril -We'll hold these medications for now his blood pressure low end of normal -Monitor closely resume as indicated  #3. Chronic kidney disease stage IV. Creatinine 3.8 on admission. Chart review indicates  Creatinine range 2.8-2.9 year ago. Difficult to know if current creatinine level related to #1 or progression of disease. -Hold nephrotoxins -Monitor urine output -Recheck in the morning  #4. Chest pain. Heart score 5. Non-STEMI 2013, reduced EF and wall motion abnormalities on echo 2013. She did not undergo cardiac cath due to #3. She'll troponin negative. EKG as noted above. Medications include imdur asa, statin -Cycle troponin -Serial EKG -Hold meds as indicated above -Obtain a 2-D echo -Cardiology consult  #5. Chronic combined systolic  and diastolic heart failure. Compensated. Chart review indicates cardiology visit 1 month ago. Opined overall volume appeared stable recommended continuation of medical regimen that includes carvedilol, isosorbide, lisinopril and laxix -Daily weights -Intake and output -Hold medications as noted above -Obtain echocardiogram  #6. Diabetes. Serum glucose 200 on admission. Home medications includes oral agents only. -Obtain a hemoglobin A1c -Hold oral agents for now -Sliding scale for optimal control    DVT prophylaxis: scd  Code Status: limited  Family Communication: grandaughter   Disposition Plan: home  Consults called: sara gribbon, cardmaster  Admission status: inpatient    Radene Gunning MD [facility]  If 7PM-7AM, please contact night-coverage [email or web address] Password [facility]  07/24/2017, 12:43 PM

## 2017-07-24 NOTE — ED Notes (Signed)
On calling patient to treatment room, patient found in wheelchair leaning forward.  When asked about complaints today, patient had syncopal episode in wheelchair where she leaned to one side of the arm rest and became unresponsive.  Patient moved to treatment room.  MD at bedside.

## 2017-07-24 NOTE — Consult Note (Signed)
Cardiology Consultation:   Patient ID: Mandy Larson; [id number]; 03/04/1930   Admit date: 07/24/2017 Date of Consult: 07/24/2017  Primary Care Provider: [name], MD Primary Cardiologist: Dr. Acie Fredrickson Primary Electrophysiologist:     Patient Profile:   Mandy Larson is a 83 y.o. 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 [place] Pink.  History of Present Illness:   Mandy Larson is known to this service and last saw Richardson Dopp, [facility] in clinic on 06/23/17. At that time, she was in her usual state of health. She was stable in terms of her combined heart failure, compliant on home medications of coreg, imdur, lisinopril, lasix, norvasc, and zocor.   Mandy Larson was admitted with ACS 10/2012. Echo with LVEF 35%, severe mitral regurgitation, and grade 2 DD. She did not undergo heart catheterization at that time 2/ CKD. She has been managed medically. Since that hospitalization, she has done very well on medical therapy and can complete daily tasks. She avoids salt.   Mandy Larson was brought to [facility] with worsening fatigue, generalized weakness, DOE, chest pain, and melena x 3 days. During triage in the ED, she slumped over in the wheelchair. Per her daughter at bedside, she LOC for a couple of minutes. She was taken back to a room and started on IVF. She never lost pulses.  Hb was 4.8 and creatinine was 3.88. She was hypotensive and had a heme positive stool. She was admitted and transfused 1U PRBC and hydrated with IVF. Repeat Hb has not been obtained. Troponin was also found to be mildly elevated at 0.06.   Primary team plans to transfuse additional units of PRBC for a Hb above 8 in anticipation of her EGD tomorrow.   On my  interview, she states that she has had chest pain for the past month. The chest pain occurs when she walks up and down stairs and is relieved with rest. The chest pain feels like a pressure and is similar to what she felt during her previous ACS episode in 2013. Chest pain is associated with shortness of breath and palpitations, all relieved with rest. She denies dizziness, nausea, vomiting, near-syncope, and syncopal episodes.    Past Medical History:  Diagnosis Date  . Anemia   . CAD (coronary artery disease) 06/26/2014   admx with NSTEMI in 12/13 >> tx medically due to CKD  . Chronic combined systolic and diastolic CHF (congestive heart failure) ([place]) 11/08/2012   Echo 12/13:  EF 35-40, inferior and inferoseptal akinesis, grade 2 diastolic dysfunction, MAC, dysfunction/tethering of the posterior papillary muscle as a result of inferior infarct, severe MR, moderate PI, PASP 50, small circumferential pericardial effusion without hemodynamic compromise  . CKD (chronic kidney disease)   . Diabetes mellitus without complication ([place])   . Former smoker   . Hyperlipidemia   . Hypertension   . Pulmonary hypertension ([place]) 11/08/2012  . Severe mitral regurgitation 11/08/2012   Due to papillary muscle dysfunction from prior inf infarct    Past Surgical History:  Procedure Laterality Date  . EYE SURGERY       Home Medications:  Prior to Admission medications   Medication Sig Start Date End Date Taking? Authorizing Provider  amLODipine (NORVASC) 5 MG tablet Take 5 mg  by mouth daily. 04/29/17  Yes [provider]  aspirin EC 81 MG tablet Take 81 mg by mouth daily.   Yes [provider]  Calcium Carb-Cholecalciferol (CALTRATE 600+D) 600-800 MG-UNIT TABS Take 1 tablet by mouth daily.   Yes [provider]  carvedilol (COREG) 3.125 MG tablet Take 1 tablet (3.125 mg total) by mouth 2 (two) times daily with a meal. 10/23/16  Yes Nahser, Wonda Cheng, MD  dorzolamide-timolol  (COSOPT) 22.3-6.8 MG/ML ophthalmic solution Place 1 drop into both eyes 2 (two) times daily.  06/20/17  Yes [provider]  furosemide (LASIX) 20 MG tablet Take 1 tablet (20 mg total) by mouth daily. 12/14/12  Yes Nahser, Wonda Cheng, MD  glipiZIDE (GLUCOTROL) 5 MG tablet Take 2.5 mg by mouth daily before breakfast. TAKE 1/2 TABLET BY MOUTH ONE TIME DAILY   Yes [provider]  isosorbide mononitrate (IMDUR) 30 MG 24 hr tablet Take 1 tablet (30 mg total) by mouth daily. 02/28/13  Yes Nahser, Wonda Cheng, MD  latanoprost (XALATAN) 0.005 % ophthalmic solution Place 1 drop into both eyes 2 (two) times daily.  06/15/17  Yes [provider]  levothyroxine (SYNTHROID, LEVOTHROID) 50 MCG tablet Take 50 mcg by mouth daily.   Yes [provider]  lisinopril (PRINIVIL,ZESTRIL) 5 MG tablet Take 5 mg by mouth daily. 04/15/17  Yes [provider]  Multiple Vitamins-Minerals (CENTRUM SILVER PO) Take 1 tablet by mouth daily. Dose Unknown   Yes [provider]  simvastatin (ZOCOR) 20 MG tablet Take 20 mg by mouth every evening.   Yes [provider]    Inpatient Medications: Scheduled Meds: . dorzolamide-timolol  1 drop Both Eyes BID  . furosemide  20 mg Intravenous QID  . insulin aspart  0-5 Units Subcutaneous QHS  . insulin aspart  0-9 Units Subcutaneous TID WC  . latanoprost  1 drop Both Eyes BID  . [START ON 07/25/2017] levothyroxine  50 mcg Oral QAC breakfast  . pantoprazole (PROTONIX) IV  40 mg Intravenous Q12H  . simvastatin  20 mg Oral QPM  . sodium chloride flush  3 mL Intravenous Q12H   Continuous Infusions: . sodium chloride    . sodium chloride     PRN Meds: sodium chloride, acetaminophen **OR** acetaminophen, ondansetron **OR** ondansetron (ZOFRAN) IV, sodium chloride flush  Allergies:   No Known Allergies  Social History:   Social History   Social History  . Marital status: Widowed    Spouse name: N/A  . Number of children: N/A  .  Years of education: N/A   Occupational History  . Not on file.   Social History Main Topics  . Smoking status: Former Smoker    Types: Cigarettes    Quit date: 11/09/1999  . Smokeless tobacco: Never Used  . Alcohol use No  . Drug use: No  . Sexual activity: No   Other Topics Concern  . Not on file   Social History Narrative  . No narrative on file    Family History:    Family History  Problem Relation Age of Onset  . Hypertension Father      ROS:  Please see the history of present illness.  ROS  All other ROS reviewed and negative.     Physical Exam/Data:   Vitals:   07/24/17 1416 07/24/17 1417 07/24/17 1447 07/24/17 1502  BP: (!) 143/57 (!) 143/57 (!) 133/56 (!) 115/53  Pulse: 64 64 66 60  Resp: (!) 22 (!) 22 (!) 24  18  Temp: (!) 97.5 F (36.4 C) (!) 97.5 F (36.4 C) (!) 97.5 F (36.4 C) 98.2 F (36.8 C)  TempSrc: Oral Oral Oral Oral  SpO2: 100% 100% 100% 100%  Weight:      Height:        Intake/Output Summary (Last 24 hours) at 07/24/17 1626 Last data filed at 07/24/17 1554  Gross per 24 hour  Intake              329 ml  Output                2 ml  Net              327 ml   Filed Weights   07/24/17 1036  Weight: 152 lb (68.9 kg)   Body mass index is 26.93 kg/m.  General:  Well nourished, well developed, in no acute distress HEENT: normal Neck: no JVD Vascular: No carotid bruits; FA pulses 2+ bilaterally without bruits  Cardiac:  normal S1, S2; RRR; systolic mumur Lungs:  clear to auscultation bilaterally, no wheezing, rhonchi or rales  Abd: soft, nontender, no hepatomegaly  Ext: no edema Musculoskeletal:  No deformities, BUE and BLE strength normal and equal Skin: warm and dry  Neuro:  CNs 2-12 intact, no focal abnormalities noted Psych:  Normal affect   EKG:  The EKG was personally reviewed and demonstrates:  NSR with RBBB Telemetry:  Telemetry was personally reviewed and demonstrates:  Sinus in the 60s  Relevant CV  Studies:  Echocardiogram 07/24/17: Study Conclusions - Left ventricle: The cavity size was normal. There was mild focal   basal hypertrophy of the septum. Systolic function was normal.   The estimated ejection fraction was in the range of 55% to 60%.   Wall motion was normal; there were no regional wall motion   abnormalities. Doppler parameters are consistent with abnormal   left ventricular relaxation (grade 1 diastolic dysfunction).   Doppler parameters are consistent with high ventricular filling   pressure. - Aortic valve: Transvalvular velocity was within the normal range.   There was no stenosis. There was no regurgitation. - Mitral valve: Transvalvular velocity was within the normal range.   There was no evidence for stenosis. There was mild regurgitation. - Left atrium: The atrium was moderately to severely dilated. - Right ventricle: The cavity size was normal. Wall thickness was   normal. Systolic function was normal. - Tricuspid valve: There was no regurgitation. - Pulmonary arteries: Systolic pressure was moderately increased.   PA peak pressure: 57 mm Hg (S). - Pericardium, extracardiac: A trivial pericardial effusion was   identified.   Echocardiogram 11/07/12: Study Conclusions - Left ventricle: The cavity size was mildly dilated. Systolic function was moderately reduced. The estimated ejection fraction was in the range of 35% to 40%. There is akinesis of the entire inferior and inferoseptal myocardium. Features are consistent with a pseudonormal left ventricular filling pattern, with concomitant abnormal relaxation and increased filling pressure (grade 2 diastolic dysfunction). - Mitral valve: Calcified annulus. There was likely dysfunction/tethering of the posterior papillary muscle as a result of inferior infarct. Severe regurgitation directed centrally and posteriorly. - Right ventricle: The cavity size was mildly dilated.  Wall thickness was normal. - Pulmonic valve: Moderate regurgitation. - Pulmonary arteries: Systolic pressure was moderately increased. PA peak pressure: 75m Hg (S). - Pericardium, extracardiac: A small, free-flowing pericardial effusion was identified circumferential to the heart. There was no evidence of hemodynamic compromise.   Laboratory Data:  Chemistry  Recent Labs Lab 07/24/17 1101  NA 137  K 3.8  CL 107  CO2 22  GLUCOSE 200*  BUN 108*  CREATININE 3.88*  CALCIUM 10.1  GFRNONAA 10*  GFRAA 11*  ANIONGAP 8     Recent Labs Lab 07/24/17 1101  PROT 5.8*  ALBUMIN 3.0*  AST 20  ALT 12*  ALKPHOS 25*  BILITOT 0.3   Hematology  Recent Labs Lab 07/24/17 1101  WBC 7.2  RBC 1.70*  HGB 4.6*  HCT 15.0*  MCV 88.2  MCH 27.1  MCHC 30.7  RDW 16.4*  PLT 193   Cardiac Enzymes  Recent Labs Lab 07/24/17 1236  TROPONINI 0.06*     Recent Labs Lab 07/24/17 1054  TROPIPOC 0.05    BNP  Recent Labs Lab 07/24/17 1114  BNP 115.6*    DDimer No results for input(s): DDIMER in the last 168 hours.  Radiology/Studies:  Dg Chest Port 1 View  Result Date: 07/24/2017 CLINICAL DATA:  Chest pain, weakness EXAM: PORTABLE CHEST 1 VIEW COMPARISON:  None. FINDINGS: Cardiomegaly. No confluent airspace opacities or effusions. Mild hyperinflation. No acute bony abnormality. IMPRESSION: Hyperinflation/COPD.  Cardiomegaly.  No active disease. Electronically Signed   By: Rolm Baptise M.D.   On: 07/24/2017 11:34    Assessment and Plan:   1. Chest pain, CAD, s/p NSTEMI without intervention - troponin 0.06 - continue cycling troponin - EKG with sinus rhythm and RBBB - chest pain may be secondary to profound blood loss anemia with Hb 4.6 - she is not a cath candidate and therefore would not benefit from an ischemic evaluation - echo today without new wall motion abnormalities, and in fact, is improved compared to 2013 - agree to continue holding cardiac medications  while pressure improves (was hypotensive on presentation)   2. Bradycardia - per clinic notes, her baseline hear rate is routinely in the 50s - coreg dose was decreased to 3.125 mg BID at clinic visit 07/07/16 - at her follow up appt, her HR was 58, she was asymptomatic - no medication changes were made at that time   3. Hx of chronic systolic and diastolic heart failure - echo in 2013 with LVEF 35-40% - echo today with improved LVEF of 55-60% with grade 1 DD - continue with daily weights and low sodium diet - would recommend IV lasix following blood transfusions and gentle IV hydration in the setting of diastolic heart failure   4. Syncope - continue to monitor on telemetry - no arrhythmia on telemetry, but will continue to monitor for arrhythmia that may have precipitated syncopal events   5. Acute blood loss anemia, melena, GI bleed - per GI - this may be responsible for her chest pain and syncopal events   For questions or updates, please contact [place] Please consult [email or web address] for contact info under Cardiology/STEMI.   Signed, Ledora Bottcher, [place]  07/24/2017 4:26 PM  I have seen and examined the patient along with Ledora Bottcher, PA .  I have reviewed the chart, notes and new data.  I agree with PA's note.  Key new complaints: no current CV complaints. Receiving transfusion, no dyspnea or angina Key examination changes: pale mucous membranes, barely audible murmur Key new findings / data: echo shows improved EF and reduced MR. ECG with chronic RBBB+LAFB  PLAN: Symptoms are explained by anemia, not acute coronary event or arrhythmia. Transfusion. EGD tomorrow. Hold ASA. Will gradually reintroduce BP meds as BP and renal function recover. Watch  for volume overload with transfusion, but improved LVEF should give [place] a little more room with volume.  Sanda Klein, MD, [place] [phone] 07/24/2017, 5:13 PM

## 2017-07-24 NOTE — ED Triage Notes (Signed)
Pt reports having fatigue, weakness, dark stools since wed. Had episodes of chest pains. Appears pale at triage and pt is hypotensive.

## 2017-07-24 NOTE — Progress Notes (Signed)
Latanoprost eye drops administration changed to at bedtime only based on information from patient and confirmation from outpatient Rx record.

## 2017-07-24 NOTE — Consult Note (Addendum)
[facility] Consult: 12:08 PM 07/24/2017  LOS: 0 days    Referring Provider: Dr Leonette Monarch in ED  Primary Care Physician:  [name], MD Primary Gastroenterologist:  Dr Lyla Son >>  Verl Blalock    Reason for Consultation:  Melena, anemia   HPI: Mandy Larson is a 83 y.o. female.  PMH Stage 4 CKD.  DM 2, on oral agents.  CAD and non-STEMI 2013: no Cath due to CKD.  CHF, EF 35 to 40 % in 2013.  Pulmonary htn.  Severe mitral valve regurge.  HTN.  Cholelithiasis and medical renal dz per ultrasound 2014.  Hypothyroidism.  Glaucoma. S/P bilateral cataract surgery.  Anemia dates back to 2013, with Hgbs as low as 7.2 in 2013.   Adenomatous colon polyps removed in 1993, 1999, 2002.   06/2005 Colonoscopy.  Hyperplastic sigmoid polyp and left sided diverticulosis. No previous EGDs.       Starting 2/12, stools became black but still formed, 1 -2 per day. C/o weakness, dizziness,DOE, fatigue, exertional chest pressure, anorexia.  No nausea, heartburn, dysphagia, abd pain, weight loss.   Takes 81 mg ASA daily, no NSAIDs.  No ETOH.    Brief syncope in wheelchair in ED.   Hgb 4.6.  MCV 88.  Last known Hgb 11/2012: 8.7.   GFR went from 19 in 2016 to 11 today.  BUN is 108.  No coags.   Troponin not elevated.  BNP 115 (RR 0-100).  EKG with BBB, NSR.  No pulmonary edema on chest x-ray.     Past Medical History:  Diagnosis Date  . Anemia   . CAD (coronary artery disease) 06/26/2014   admx with NSTEMI in 12/13 >> tx medically due to CKD  . Chronic combined systolic and diastolic CHF (congestive heart failure) ([place]) 11/08/2012   Echo 12/13:  EF 35-40, inferior and inferoseptal akinesis, grade 2 diastolic dysfunction, MAC, dysfunction/tethering of the posterior papillary muscle as a result of inferior infarct, severe  MR, moderate PI, PASP 50, small circumferential pericardial effusion without hemodynamic compromise  . CKD (chronic kidney disease)   . Diabetes mellitus without complication ([place])   . Former smoker   . Hyperlipidemia   . Hypertension   . Pulmonary hypertension ([place]) 11/08/2012  . Severe mitral regurgitation 11/08/2012   Due to papillary muscle dysfunction from prior inf infarct    Past Surgical History:  Procedure Laterality Date  . EYE SURGERY      Prior to Admission medications   Medication Sig Start Date End Date Taking? Authorizing Provider  amLODipine (NORVASC) 5 MG tablet Take 5 mg by mouth daily. 04/29/17  Yes [provider]  aspirin EC 81 MG tablet Take 81 mg by mouth daily.   Yes [provider]  Calcium Carb-Cholecalciferol (CALTRATE 600+D) 600-800 MG-UNIT TABS Take 1 tablet by mouth daily.   Yes [provider]  carvedilol (COREG) 3.125 MG tablet Take 1 tablet (3.125 mg total) by mouth 2 (two) times daily with a meal. 10/23/16  Yes Nahser, Wonda Cheng, MD  dorzolamide-timolol (COSOPT) 22.3-6.8 MG/ML ophthalmic  solution Place 1 drop into both eyes 2 (two) times daily.  06/20/17  Yes [provider]  furosemide (LASIX) 20 MG tablet Take 1 tablet (20 mg total) by mouth daily. 12/14/12  Yes Nahser, Wonda Cheng, MD  glipiZIDE (GLUCOTROL) 5 MG tablet Take 2.5 mg by mouth daily before breakfast. TAKE 1/2 TABLET BY MOUTH ONE TIME DAILY   Yes [provider]  isosorbide mononitrate (IMDUR) 30 MG 24 hr tablet Take 1 tablet (30 mg total) by mouth daily. 02/28/13  Yes Nahser, Wonda Cheng, MD  latanoprost (XALATAN) 0.005 % ophthalmic solution Place 1 drop into both eyes 2 (two) times daily.  06/15/17  Yes [provider]  levothyroxine (SYNTHROID, LEVOTHROID) 50 MCG tablet Take 50 mcg by mouth daily.   Yes [provider]  lisinopril (PRINIVIL,ZESTRIL) 5 MG tablet Take 5 mg by mouth daily. 04/15/17  Yes [provider]  Multiple  Vitamins-Minerals (CENTRUM SILVER PO) Take 1 tablet by mouth daily. Dose Unknown   Yes [provider]  simvastatin (ZOCOR) 20 MG tablet Take 20 mg by mouth every evening.   Yes [provider]    Scheduled Meds:  Infusions:  PRN Meds:    Allergies as of 07/24/2017  . (No Known Allergies)    Family History  Problem Relation Age of Onset  . Family history unknown: Yes    Social History   Social History  . Marital status: Widowed    Spouse name: N/A  . Number of children: N/A  . Years of education: N/A   Occupational History  . Not on file.   Social History Main Topics  . Smoking status: Former Smoker    Types: Cigarettes    Quit date: 11/09/1999  . Smokeless tobacco: Never Used  . Alcohol use No  . Drug use: No  . Sexual activity: No   Other Topics Concern  . Not on file   Social History Narrative  . No narrative on file    REVIEW OF SYSTEMS: Constitutional:  Per HPI.  Prior to recent days, she had good energy level and was able todo a lot of activities around the house. ENT:  No nose bleeds Pulm:  Per HPI.  No cough. CV:  Exertional chest pressure.No palpitations, no LE edema.  GU:  No hematuria, no frequency GI:  Per HPI Heme:  No unusual or excessive bleeding/bruising. Not aware of any previous blood transfusions or prescriptions for supplemental iron.   Transfusions:  None.   Neuro:  Syncope in ED.  Dizzy at home.  No headaches, no peripheral tingling or numbness Derm:  No itching, no rash or sores.  Endocrine:  No sweats or chills.  No polyuria or dysuria Immunization:  Not queried.   Travel:  None beyond local counties in last few months.    PHYSICAL EXAM: Vital signs in last 24 hours: Vitals:   07/24/17 1200 07/24/17 1201  BP: (!) 101/45 (!) 101/45  Pulse: (!) 59 60  Resp:  (!) 24  Temp:  97.6 F (36.4 C)  SpO2: 100%    Wt Readings from Last 3 Encounters:  07/24/17 68.9 kg (152 lb)  06/23/17 69.7 kg (153 lb 9.6 oz)    07/07/16 71.8 kg (158 lb 6.4 oz)    General: pleasant, comfortable, aged and somewhat frail-appearing [name] who is comfortable nd provides good history.. Head:  No facial asymmetry. No signs of head trauma.  Eyes:  No scleral icterus. Conjunctiva is pale. Upper lid drop on left.  Ears:  Slightly hard of hearing  Nose:  No discharge or congesti Mouth:  Moist, pink oral mucosa. Full dentues in place, not removed for exam. Tongue midline. Neck:  o JVD, no masses, no thyromegaly. Lungs:  Somewhat reduced breath sounds overall but clear. No dyspnea. No cough. Heart: RRR with 1/6 systolicmurmur. S1, S2 present. Abdomen:  Oft. Not tender or distended. Bowel sounds active. No bruits, hernias organomegaly, masses..   Rectal: did not repeat ED physician's exam. This revealed dark brown stool which tested FOBT positive   Musc/Skeltl: no joint redness or swelling. Some arthriticfingers. Extremities:  No CCE.  Neurologic:  Alert. Oriented times 3. Good historian. Moves all 4 limbs. No tremor. No gross deficits. Skin:  No telangiectasia. No sores, rashes or suspicious lesions Nodes:  No cervical or inguinal adenopathy.   Psych:  Calm, pleasant, cooperative.  Intake/Output from previous day: No intake/output data recorded. Intake/Output this shift: No intake/output data recorded.  LAB RESULTS:  Recent Labs  07/24/17 1101  WBC 7.2  HGB 4.6*  HCT 15.0*  PLT 193   BMET Lab Results  Component Value Date   NA 137 07/24/2017   NA 141 07/07/2016   NA 139 07/02/2015   K 3.8 07/24/2017   K 4.3 07/07/2016   K 3.9 07/02/2015   CL 107 07/24/2017   CL 103 07/07/2016   CL 98 07/02/2015   CO2 22 07/24/2017   CO2 29 07/07/2016   CO2 31 07/02/2015   GLUCOSE 200 (H) 07/24/2017   GLUCOSE 120 (H) 07/07/2016   GLUCOSE 112 (H) 07/02/2015   BUN 108 (H) 07/24/2017   BUN 41 (H) 07/07/2016   BUN 41 (H) 07/02/2015   CREATININE 3.88 (H) 07/24/2017   CREATININE 2.89 (H) 07/07/2016   CREATININE 2.96 (H)  07/02/2015   CALCIUM 10.1 07/24/2017   CALCIUM 9.7 07/07/2016   CALCIUM 10.1 07/02/2015   LFT  Recent Labs  07/24/17 1101  PROT 5.8*  ALBUMIN 3.0*  AST 20  ALT 12*  ALKPHOS 25*  BILITOT 0.3   PT/INR No results found for: INR, PROTIME Hepatitis Panel No results for input(s): HEPBSAG, HCVAB, HEPAIGM, HEPBIGM in the last 72 hours. C-Diff No components found for: CDIFF Lipase  No results found for: LIPASE  Drugs of Abuse  No results found for: LABOPIA, COCAINSCRNUR, LABBENZ, AMPHETMU, THCU, LABBARB   RADIOLOGY STUDIES: Dg Chest Port 1 View  Result Date: 07/24/2017 CLINICAL DATA:  Chest pain, weakness EXAM: PORTABLE CHEST 1 VIEW COMPARISON:  None. FINDINGS: Cardiomegaly. No confluent airspace opacities or effusions. Mild hyperinflation. No acute bony abnormality. IMPRESSION: Hyperinflation/COPD.  Cardiomegaly.  No active disease. Electronically Signed   By: Rolm Baptise M.D.   On: 07/24/2017 11:34     IMPRESSION:   *  Symptomatic, profound anemia.  Surprising that she is not more symptomatic with a hemoglobin of 4.6. I suspect that we are seeing some acute blood in the setting of anemia of chronic disease given her advanced kidney disease.  However the sxs began just 3 days ago.   R/o ulcers, r/o AV malformations.    *  CKD stage 4, ? If current readings are her baseline or represent element of AKI  *  Non-STEMI 2013.  Extent of CAD not known.  1st Troponin is negative, EKG without ischemic changes and mild elevation of BNP but no CHF on CXR.    GI bleed/melena  Anemia of acute GI blood loss  PLAN:     *  The first  of 2 U PRBCs currently transfusing.  Hgb/hct at 7 PM tonight.  Additional PRBCs prn to get Hgb near or above 7.  CBC will be rechecked in the morning. Serial troponins in progress. TSH ordered as add-on to labs from today.  *  I ordered Protonix IV twice a day.  Diet clears.  NPO after midnight in case she goes to EGD tomorrow.    *  Orders for EGD placed  but would be tomorrow at soonest and final decision forthcoming from Dr Loletha Carrow.     Azucena Freed  07/24/2017, 12:08 PM Pager: [phone]  I have reviewed the entire case in detail with the above APP and discussed the plan in detail.  Therefore, I agree with the diagnoses recorded above. In addition,  I have personally interviewed and examined the patient and have personally reviewed any abdominal/pelvic CT scan images.  My additional thoughts are as follows:  This patient has multifactorial anemia from chronic kidney disease and now acute GI bleeding. Based on last known hemoglobin value of 8.7 in early 2014, and creatinine of 2.5 at that time, it is clear that she has a significant element of anemia of chronic disease. Her granddaughter is with her today, and corroborates the history that the patient does not have a nephrologist. She also reports that she has not been receiving IV iron treatments or anything else for her blood counts. This should certainly be researched to determine if she needs appropriate outpatient referral for IV iron and/or erythropoietin.  On my exam today, she has normal blood pressure and heart rate, she is mentating well, currently has no chest pain. She described a heaviness and chest pressure earlier today and lightheadedness. Her abdominal exam is benign. She had a large passage of soft black stool during the encounter.  I suspect she has an upper GI bleed, possibly peptic ulcer from aspirin or an AVM. She is currently hemodynamically stable, and our current plans are for an upper endoscopy tomorrow. She requires further transfusion, and I would like her hemoglobin to be greater than or equal to 8 before she is sedated for an endoscopy, as long as she remains stable enough for that to occur.  She is on twice daily IV proton pump inhibitor.  I discussed an upper endoscopy with her in the presence of her granddaughter and her nurse, and she is agreeable  The benefits and  risks of the planned procedure were described in detail with the patient or (when appropriate) their health care proxy.  Risks were outlined as including, but not limited to, bleeding, infection, perforation, adverse medication reaction leading to cardiac or pulmonary decompensation, or pancreatitis (if ERCP).  The limitation of incomplete mucosal visualization was also discussed.  No guarantees or warranties were given. Patient at increased risk for cardiopulmonary complications of procedure due to medical comorbidities.   She understands that this would be performed by my partner Dr. Henrene Pastor, who will be one for weekend call starting this evening. The exact timing of the endoscopy tomorrow is dependent upon a sufficient rise in her hemoglobin with transfusion. She is on clear liquids for now, and then nothing by mouth after midnight.   Nelida Meuse III Pager [phone]  Mon-Fri 8a-5p [phone] after 5p, weekends, holidays

## 2017-07-24 NOTE — ED Provider Notes (Signed)
[facility] DEPT Provider Note   CSN: [id number] Arrival date & time: 07/24/17  1008     History   Chief Complaint Chief Complaint  Patient presents with  . Weakness    HPI Mandy Larson is a 83 y.o. female.  HPI  83 year old female with a history of hypertension, hyperlipidemia, diabetes, CKD, CAD with non-ST segment elevation MI in 2013 treated medically. She presents today for 3 days of melena with associated worsening fatigue, exertional chest pain and dyspnea on exertion.No other alleviating or aggravating factors. Patient denies any prior episodes of melena. Denies any anticoagulation. Denies any known GI pathology. Patient takes baby aspirin daily. She denies any recent fevers or infections. Denies any other physical complaints.  Past Medical History:  Diagnosis Date  . Anemia   . CAD (coronary artery disease) 06/26/2014   admx with NSTEMI in 12/13 >> tx medically due to CKD  . Chronic combined systolic and diastolic CHF (congestive heart failure) ([place]) 11/08/2012   Echo 12/13:  EF 35-40, inferior and inferoseptal akinesis, grade 2 diastolic dysfunction, MAC, dysfunction/tethering of the posterior papillary muscle as a result of inferior infarct, severe MR, moderate PI, PASP 50, small circumferential pericardial effusion without hemodynamic compromise  . CKD (chronic kidney disease)   . Diabetes mellitus without complication ([place])   . Former smoker   . Hyperlipidemia   . Hypertension   . Pulmonary hypertension ([place]) 11/08/2012  . Severe mitral regurgitation 11/08/2012   Due to papillary muscle dysfunction from prior inf infarct    Patient Active Problem List   Diagnosis Date Noted  . CAD (coronary artery disease) 06/26/2014  . Chronic combined systolic and diastolic CHF (congestive heart failure) ([place]) 11/08/2012  . Pulmonary hypertension ([place]) 11/08/2012  . Severe mitral regurgitation 11/08/2012  . Acute pericardial effusion-small 11/08/2012  . Pulmonary edema  11/06/2012  . Chest pain 11/06/2012  . Hypertensive heart and chronic kidney disease with heart failure and stage 1 through stage 4 chronic kidney disease, or chronic kidney disease ([place]) 11/06/2012  . DM2 (diabetes mellitus, type 2) ([place]) 11/06/2012  . CKD (chronic kidney disease) stage 4, GFR 15-29 ml/min ([facility]) 11/06/2012    Past Surgical History:  Procedure Laterality Date  . EYE SURGERY      OB History    No data available       Home Medications    Prior to Admission medications   Medication Sig Start Date End Date Taking? Authorizing Provider  amLODipine (NORVASC) 5 MG tablet Take 5 mg by mouth daily. 04/29/17   [provider]  aspirin EC 81 MG tablet Take 81 mg by mouth daily.    [provider]  Calcium Carbonate (CALTRATE 600 PO) Take 600 mg by mouth daily.     [provider]  carvedilol (COREG) 3.125 MG tablet Take 1 tablet (3.125 mg total) by mouth 2 (two) times daily with a meal. 10/23/16   Nahser, Wonda Cheng, MD  dorzolamide-timolol (COSOPT) 22.3-6.8 MG/ML ophthalmic solution Place 1 drop into both eyes at bedtime.  06/20/17   [provider]  furosemide (LASIX) 20 MG tablet Take 1 tablet (20 mg total) by mouth daily. 12/14/12   Nahser, Wonda Cheng, MD  glipiZIDE (GLUCOTROL) 5 MG tablet TAKE 1/2 TABLET BY MOUTH ONE TIME DAILY    [provider]  isosorbide mononitrate (IMDUR) 30 MG 24 hr tablet Take 1 tablet (30 mg total) by mouth daily. 02/28/13   Nahser, Wonda Cheng, MD  latanoprost (XALATAN) 0.005 % ophthalmic solution  Place 1 drop into both eyes 2 (two) times daily.  06/15/17   [provider]  levothyroxine (SYNTHROID, LEVOTHROID) 50 MCG tablet Take 50 mcg by mouth daily.    [provider]  lisinopril (PRINIVIL,ZESTRIL) 5 MG tablet Take 5 mg by mouth daily. 04/15/17   [provider]  Multiple Vitamins-Minerals (CENTRUM SILVER PO) Take 1 tablet by mouth daily. Dose Unknown    [provider]    simvastatin (ZOCOR) 20 MG tablet Take 20 mg by mouth every evening.    [provider]    Family History Family History  Problem Relation Age of Onset  . Family history unknown: Yes    Social History Social History  Substance Use Topics  . Smoking status: Former Smoker    Types: Cigarettes    Quit date: 11/09/1999  . Smokeless tobacco: Never Used  . Alcohol use No     Allergies   Patient has no known allergies.   Review of Systems Review of Systems All other systems are reviewed and are negative for acute change except as noted in the HPI   Physical Exam Updated Vital Signs BP (!) 86/44 (BP Location: Left Arm)   Pulse 70   Temp 97.9 F (36.6 C) (Oral)   Resp 18   Ht _0  (1.6 m)   Wt 68.9 kg (152 lb)   SpO2 99%   BMI 26.93 kg/m   Physical Exam  Constitutional: She is oriented to person, place, and time. She appears well-developed and well-nourished. She appears lethargic. She appears toxic.  HENT:  Head: Normocephalic and atraumatic.  Nose: Nose normal.  Pale lips and gingiva. Upper and lower dentures noted.  Eyes: Pupils are equal, round, and reactive to light. Conjunctivae and EOM are normal. Right eye exhibits no discharge. Left eye exhibits no discharge. No scleral icterus.  Pale conjunctiva  Neck: Normal range of motion. Neck supple.  Cardiovascular: Normal rate and regular rhythm.  Exam reveals no gallop and no friction rub.   No murmur heard. Pulmonary/Chest: Effort normal and breath sounds normal. No stridor. No respiratory distress. She has no rales.  Abdominal: Soft. She exhibits no distension. There is no tenderness.  Musculoskeletal: She exhibits no edema or tenderness.  Neurological: She is oriented to person, place, and time. She appears lethargic.  Skin: Skin is warm and dry. No rash noted. She is not diaphoretic. No erythema.  Psychiatric: She has a normal mood and affect.  Vitals reviewed.    ED Treatments / Results   Labs (all labs ordered are listed, but only abnormal results are displayed) Labs Reviewed  COMPREHENSIVE METABOLIC PANEL - Abnormal; Notable for the following:       Result Value   Glucose, Bld 200 (*)    BUN 108 (*)    Creatinine, Ser 3.88 (*)    Total Protein 5.8 (*)    Albumin 3.0 (*)    ALT 12 (*)    Alkaline Phosphatase 25 (*)    GFR calc non Af Amer 10 (*)    GFR calc Af Amer 11 (*)    All other components within normal limits  CBC - Abnormal; Notable for the following:    RBC 1.70 (*)    Hemoglobin 4.6 (*)    HCT 15.0 (*)    RDW 16.4 (*)    All other components within normal limits  POC OCCULT BLOOD, ED - Abnormal; Notable for the following:    Fecal Occult Bld POSITIVE (*)  All other components within normal limits  CBG MONITORING, ED - Abnormal; Notable for the following:    Glucose-Capillary 202 (*)    All other components within normal limits  BRAIN NATRIURETIC PEPTIDE  I-STAT TROPONIN, ED  TYPE AND SCREEN  ABO/RH  PREPARE RBC (CROSSMATCH)    EKG  EKG Interpretation  Date/Time:  Friday July 24 2017 10:51:35 EDT Ventricular Rate:  63 PR Interval:    QRS Duration: 187 QT Interval:  509 QTC Calculation: 522 R Axis:   -91 Text Interpretation:  Sinus rhythm RBBB and LAFB NO STEMI No old tracing to compare Confirmed by Addison Lank [phone]) on 07/24/2017 11:16:07 AM       Radiology Dg Chest Port 1 View  Result Date: 07/24/2017 CLINICAL DATA:  Chest pain, weakness EXAM: PORTABLE CHEST 1 VIEW COMPARISON:  None. FINDINGS: Cardiomegaly. No confluent airspace opacities or effusions. Mild hyperinflation. No acute bony abnormality. IMPRESSION: Hyperinflation/COPD.  Cardiomegaly.  No active disease. Electronically Signed   By: Rolm Baptise M.D.   On: 07/24/2017 11:34    Procedures Procedures (including critical care time) CRITICAL CARE Performed by: Grayce Sessions Ahmoni Edge Total critical care time: 35 minutes Critical care time was exclusive of  separately billable procedures and treating other patients. Critical care was necessary to treat or prevent imminent or life-threatening deterioration. Critical care was time spent personally by me on the following activities: development of treatment plan with patient and/or surrogate as well as nursing, discussions with consultants, evaluation of patient's response to treatment, examination of patient, obtaining history from patient or surrogate, ordering and performing treatments and interventions, ordering and review of laboratory studies, ordering and review of radiographic studies, pulse oximetry and re-evaluation of patient's condition.   Medications Ordered in ED Medications  dorzolamide-timolol (COSOPT) 22.3-6.8 MG/ML ophthalmic solution 1 drop (not administered)  latanoprost (XALATAN) 0.005 % ophthalmic solution 1 drop (not administered)  levothyroxine (SYNTHROID, LEVOTHROID) tablet 50 mcg (not administered)  simvastatin (ZOCOR) tablet 20 mg (not administered)  sodium chloride flush (NS) 0.9 % injection 3 mL (not administered)  sodium chloride flush (NS) 0.9 % injection 3 mL (not administered)  0.9 %  sodium chloride infusion (not administered)  acetaminophen (TYLENOL) tablet 650 mg (not administered)    Or  acetaminophen (TYLENOL) suppository 650 mg (not administered)  ondansetron (ZOFRAN) tablet 4 mg (not administered)    Or  ondansetron (ZOFRAN) injection 4 mg (not administered)  insulin aspart (novoLOG) injection 0-9 Units (not administered)  insulin aspart (novoLOG) injection 0-5 Units (not administered)  0.9 %  sodium chloride infusion (not administered)  pantoprazole (PROTONIX) injection 40 mg (not administered)  0.9 %  sodium chloride infusion ( Intravenous New Bag/Given 07/24/17 1204)     Initial Impression / Assessment and Plan / ED Course  I have reviewed the triage vital signs and the nursing notes.  Pertinent labs & imaging results that were available during my  care of the patient were reviewed by me and considered in my medical decision making (see chart for details).  Clinical Course as of Jul 25 1147  Fri Jul 24, 2017  1100 Patient reports 3 days of melena, with associated worsening fatigue, exertional chest pain and shortness of breath.   Patient appears pale and had a syncopal episode while being transported to the room. She was noted to be hypotensive with systolics in the 63O. Mental status improved after being placed in Trendelenburg. CBG 202. Hemoccult positive. Patient given 1 L of IV fluid, with improved blood pressures to  systolics in the 800Y.  [PC]  3494 On review of records and patient noted to have congestive heart failure with EF of 35% in 2013.  [PC]  9447 CBC with a hemoglobin of 4.8. Given the patient's past cardiovascular disease, ideal hemoglobin with the above 9. 2 units of packed red blood cells were ordered for administration in the emergency department. We will transfuse over a prolonged period of time due to the patient's congestive heart failure. Patient will require admission for further workup and management.  [PC]  3958 Will discuss case with gastroenterology and hospitalist  [PC]    Clinical Course User Index [PC] Renn Dirocco, Grayce Sessions, MD      Final Clinical Impressions(s) / ED Diagnoses   Final diagnoses:  Chest pain  Melena  Acute blood loss anemia  Acute on chronic renal insufficiency  Other specified hypotension  Syncope, unspecified syncope type      Fatima Blank, MD 07/24/17 1331

## 2017-07-24 NOTE — Progress Notes (Signed)
  Echocardiogram 2D Echocardiogram has been performed.  Mandy Larson 07/24/2017, 3:40 PM

## 2017-07-25 ENCOUNTER — Encounter (HOSPITAL_COMMUNITY)
Admission: EM | Disposition: A | Discharge status: Home / Self Care | Payer: Self-pay | Source: Home / Self Care | Attending: Internal Medicine

## 2017-07-25 ENCOUNTER — Inpatient Hospital Stay (HOSPITAL_COMMUNITY): Payer: Medicare HMO | Admitting: Anesthesiology

## 2017-07-25 ENCOUNTER — Encounter (HOSPITAL_COMMUNITY): Payer: Self-pay | Admitting: Certified Registered Nurse Anesthetist

## 2017-07-25 DIAGNOSIS — E1122 Type 2 diabetes mellitus with diabetic chronic kidney disease: Secondary | ICD-10-CM

## 2017-07-25 DIAGNOSIS — K250 Acute gastric ulcer with hemorrhage: Principal | ICD-10-CM

## 2017-07-25 DIAGNOSIS — R079 Chest pain, unspecified: Secondary | ICD-10-CM

## 2017-07-25 DIAGNOSIS — R55 Syncope and collapse: Secondary | ICD-10-CM

## 2017-07-25 DIAGNOSIS — K222 Esophageal obstruction: Secondary | ICD-10-CM

## 2017-07-25 HISTORY — PX: ESOPHAGOGASTRODUODENOSCOPY: SHX5428

## 2017-07-25 LAB — CBC
HCT: 21.7 % — ABNORMAL LOW (ref 36.0–46.0)
Hemoglobin: 7 g/dL — ABNORMAL LOW (ref 12.0–15.0)
MCH: 27.9 pg (ref 26.0–34.0)
MCHC: 32.3 g/dL (ref 30.0–36.0)
MCV: 86.5 fL (ref 78.0–100.0)
PLATELETS: 164 10*3/uL (ref 150–400)
RBC: 2.51 MIL/uL — AB (ref 3.87–5.11)
RDW: 15.7 % — AB (ref 11.5–15.5)
WBC: 6.7 10*3/uL (ref 4.0–10.5)

## 2017-07-25 LAB — BASIC METABOLIC PANEL
Anion gap: 9 (ref 5–15)
BUN: 100 mg/dL — AB (ref 6–20)
CALCIUM: 9.7 mg/dL (ref 8.9–10.3)
CHLORIDE: 107 mmol/L (ref 101–111)
CO2: 22 mmol/L (ref 22–32)
CREATININE: 3.72 mg/dL — AB (ref 0.44–1.00)
GFR calc non Af Amer: 10 mL/min — ABNORMAL LOW (ref >60)
GFR, EST AFRICAN AMERICAN: 12 mL/min — AB (ref >60)
Glucose, Bld: 132 mg/dL — ABNORMAL HIGH (ref 65–99)
Potassium: 3.5 mmol/L (ref 3.5–5.1)
Sodium: 138 mmol/L (ref 135–145)

## 2017-07-25 LAB — GLUCOSE, CAPILLARY
GLUCOSE-CAPILLARY: 113 mg/dL — AB (ref 65–99)
GLUCOSE-CAPILLARY: 142 mg/dL — AB (ref 65–99)
GLUCOSE-CAPILLARY: 110 mg/dL — AB (ref 65–99)
Glucose-Capillary: 115 mg/dL — ABNORMAL HIGH (ref 65–99)

## 2017-07-25 LAB — HEMOGLOBIN AND HEMATOCRIT, BLOOD
HEMATOCRIT: 28.6 % — AB (ref 36.0–46.0)
HEMOGLOBIN: 9.7 g/dL — AB (ref 12.0–15.0)

## 2017-07-25 LAB — TROPONIN I: Troponin I: 0.07 ng/mL (ref <0.03)

## 2017-07-25 SURGERY — EGD (ESOPHAGOGASTRODUODENOSCOPY)
Anesthesia: Monitor Anesthesia Care

## 2017-07-25 MED ORDER — PROPOFOL 500 MG/50ML IV EMUL
INTRAVENOUS | Status: DC | PRN
  Administered 2017-07-25: 50 ug/kg/min via INTRAVENOUS

## 2017-07-25 MED ORDER — PROMETHAZINE HCL 25 MG/ML IJ SOLN: 6.2500 mg | INTRAMUSCULAR | Status: DC | PRN

## 2017-07-25 MED ORDER — LACTATED RINGERS IV SOLN
INTRAVENOUS | Status: DC | PRN
  Administered 2017-07-25: 11:00:00 via INTRAVENOUS

## 2017-07-25 MED ORDER — MIDAZOLAM HCL 2 MG/2ML IJ SOLN: 0.5000 mg | Freq: Once | INTRAMUSCULAR | Status: DC | PRN

## 2017-07-25 MED ORDER — AMLODIPINE BESYLATE 5 MG PO TABS
5.0000 mg | ORAL_TABLET | Freq: Every day | ORAL | Status: DC
  Administered 2017-07-25 – 2017-07-26 (×2): 5 mg via ORAL
  Filled 2017-07-25 (×2): qty 1

## 2017-07-25 MED ORDER — PROPOFOL 10 MG/ML IV BOLUS
INTRAVENOUS | Status: DC | PRN
  Administered 2017-07-25 (×2): 15 mg via INTRAVENOUS

## 2017-07-25 MED ORDER — MEPERIDINE HCL 100 MG/ML IJ SOLN: 6.2500 mg | INTRAMUSCULAR | Status: DC | PRN

## 2017-07-25 MED ORDER — GLUCERNA SHAKE PO LIQD
237.0000 mL | Freq: Two times a day (BID) | ORAL | Status: DC
  Administered 2017-07-25 – 2017-07-26 (×3): 237 mL via ORAL

## 2017-07-25 NOTE — Progress Notes (Signed)
    Progress Note   Subjective  Chief Complaint: Melena, Anemia  No further melena overnight per pt. No abdominal pain. Feeling well, but hungry.    Objective   Vital signs in last 24 hours: Temp:  [97.5 F (36.4 C)-98.6 F (37 C)] 98 F (36.7 C) (09/15 0805) Pulse Rate:  [57-75] 74 (09/15 0805) Resp:  [16-24] 16 (09/15 0805) BP: (86-152)/(44-82) 152/60 (09/15 0805) SpO2:  [96 %-100 %] 99 % (09/15 0805) Weight:  [152 lb (68.9 kg)-152 lb 6.4 oz (69.1 kg)] 152 lb 6.4 oz (69.1 kg) (09/15 0340) Last BM Date: 07/25/17 General:    AA female in NAD Heart:  Regular rate and rhythm; no murmurs Lungs: Respirations even and unlabored, lungs CTA bilaterally Abdomen:  Soft, nontender and nondistended. Normal bowel sounds. Extremities:  Without edema. Neurologic:  Alert and oriented,  grossly normal neurologically. Psych:  Cooperative. Normal mood and affect.  Intake/Output from previous day: 09/14 0701 - 09/15 0700 In: 637 [Blood:637] Out: 406 [Urine:403; Stool:3] Intake/Output this shift: Total I/O In: 30 [P.O.:30] Out: -   Lab Results:  Recent Labs  07/24/17 1101 07/24/17 2016 07/25/17 0010 07/25/17 0739  WBC 7.2  --  6.7  --   HGB 4.6* 6.9* 7.0* 9.7*  HCT 15.0* 21.2* 21.7* 28.6*  PLT 193  --  164  --    BMET  Recent Labs  07/24/17 1101 07/25/17 0010  NA 137 138  K 3.8 3.5  CL 107 107  CO2 22 22  GLUCOSE 200* 132*  BUN 108* 100*  CREATININE 3.88* 3.72*  CALCIUM 10.1 9.7   LFT  Recent Labs  07/24/17 1101  PROT 5.8*  ALBUMIN 3.0*  AST 20  ALT 12*  ALKPHOS 25*  BILITOT 0.3   Studies/Results: Dg Chest Port 1 View  Result Date: 07/24/2017 CLINICAL DATA:  Chest pain, weakness EXAM: PORTABLE CHEST 1 VIEW COMPARISON:  None. FINDINGS: Cardiomegaly. No confluent airspace opacities or effusions. Mild hyperinflation. No acute bony abnormality. IMPRESSION: Hyperinflation/COPD.  Cardiomegaly.  No active disease. Electronically Signed   By: Rolm Baptise M.D.    On: 07/24/2017 11:34     Assessment / Plan:   Assessment: 1. Melena: no further this  morning 2. ABLA: hgb 9.7 this morning; again thought acute on chronic  Plan: 1. EGD today with Dr. Henrene Pastor, timing to be determined. Likely early afternoon. 2. Continue NPO for now. 3. Continue to monitor hgb with transfusion as needed 4. Please await final recommendations after procedure  Thank you for your kind consultation.   LOS: 1 day   Levin Erp  07/25/2017, 9:38 AM  Pager # [phone]  GI ATTENDING  Patient personally seen and examined. Stable post transfusion. For EGD.  Docia Chuck. Geri Seminole., M.D. [facility] Division of Gastroenterology

## 2017-07-25 NOTE — Progress Notes (Signed)
PROGRESS NOTE    Mandy Larson  KKX:[id number] DOB: 02/19/1930 DOA: 07/24/2017 PCP: [name], MD   Brief Narrative:  Mandy Larson is a delightful 83 y.o. female with medical history significant for CAD, chronic combined systolic diastolic heart failure, severe mitral regurg, hypertension, diabetes, chronic kidney disease presents to the ED with  shortness of breath with exertion, chest pain and melena. Initial evaluation reveals a hemoglobin of 4.8 creatinine of 3.8 .  [facility] called to admit.  She received 4 units of prbc transfusion and her repeat H&H is 9.7. GI consulted and she underwent EGD showing esophageal stricture and pyloric ulcer. Repeat H&H in am and if stable , will d/c in am.    Assessment & Plan:   Principal Problem:   Acute blood loss anemia Active Problems:   Chest pain   DM2 (diabetes mellitus, type 2) (HCC)   CKD (chronic kidney disease) stage 4, GFR 15-29 ml/min (HCC)   Chronic combined systolic and diastolic CHF (congestive heart failure) (HCC)   CAD (coronary artery disease)   Melena   Hypotension   Syncope   Acute gastric ulcer with hemorrhage   Symptomatic anemia probably from PUD:  S/p 4 units of prbc transfusion.  Anemia panel will be ordered to check iron studies and ferritin.  GI consulted and underwent EGD showing pyloric ulcer and esophageal stricture.  Back on regular diet .  Recommended PPI.     DIABETES mellitus:  CBG (last 3)   Recent Labs  07/24/17 2157 07/25/17 0802 07/25/17 1117  GLUCAP 160* 115* 113*    Resume SSI.    Chronic systolic and diastolic heart failure:  She appears compensated.    Hypotension: resolved.   Stage 4 CKD:  Creatinine at baseline.     Chest pain:  Possibly from symptomatic anemia/ demand ischemia.  Elevated troponins from demand ischemia:  No further intervention at this time.     DVT prophylaxis: SCD'S Code Status: PARTIAL CODE.  Family Communication: None at bedside.    Disposition Plan: home in am.    Consultants:   Cardiology  Gastroenterology.   Procedures: EGD on 9/15  Antimicrobials: none.   Subjective: No complaints.   Objective: Vitals:   07/25/17 0805 07/25/17 1054 07/25/17 1147 07/25/17 1200  BP: (!) 152/60 (!) 160/62 (!) 138/56 (!) 141/68  Pulse: 74   73  Resp: 16 (!) 24 19 19   Temp: 98 F (36.7 C) 98.3 F (36.8 C) 98.3 F (36.8 C)   TempSrc:  Oral Oral   SpO2: 99% 97% 97% 96%  Weight:  68.9 kg (152 lb)    Height:  5\' 3"  (1.6 m)      Intake/Output Summary (Last 24 hours) at 07/25/17 1400 Last data filed at 07/25/17 1145  Gross per 24 hour  Intake              997 ml  Output             1006 ml  Net               -9 ml   Filed Weights   07/24/17 1036 07/25/17 0340 07/25/17 1054  Weight: 68.9 kg (152 lb) 69.1 kg (152 lb 6.4 oz) 68.9 kg (152 lb)    Examination:  General exam: Appears calm and comfortable  Respiratory system: Clear to auscultation. Respiratory effort normal. Cardiovascular system: S1 & S2 heard, RRR. No JVD, murmurs, rubs, gallops or clicks. No pedal edema. Gastrointestinal system: Abdomen is nondistended,  soft and nontender. No organomegaly or masses felt. Normal bowel sounds heard. Central nervous system: Alert and oriented. No focal neurological deficits. Extremities: Symmetric 5 x 5 power. Skin: No rashes, lesions or ulcers Psychiatry: Judgement and insight appear normal. Mood & affect appropriate.     Data Reviewed: I have personally reviewed following labs and imaging studies  CBC:  Recent Labs Lab 07/24/17 1101 07/24/17 2016 07/25/17 0010 07/25/17 0739  WBC 7.2  --  6.7  --   HGB 4.6* 6.9* 7.0* 9.7*  HCT 15.0* 21.2* 21.7* 28.6*  MCV 88.2  --  86.5  --   PLT 193  --  164  --    Basic Metabolic Panel:  Recent Labs Lab 07/24/17 1101 07/25/17 0010  NA 137 138  K 3.8 3.5  CL 107 107  CO2 22 22  GLUCOSE 200* 132*  BUN 108* 100*  CREATININE 3.88* 3.72*  CALCIUM 10.1 9.7    GFR: Estimated Creatinine Clearance: 10.3 mL/min (A) (by C-G formula based on SCr of 3.72 mg/dL (H)). Liver Function Tests:  Recent Labs Lab 07/24/17 1101  AST 20  ALT 12*  ALKPHOS 25*  BILITOT 0.3  PROT 5.8*  ALBUMIN 3.0*   No results for input(s): LIPASE, AMYLASE in the last 168 hours. No results for input(s): AMMONIA in the last 168 hours. Coagulation Profile: No results for input(s): INR, PROTIME in the last 168 hours. Cardiac Enzymes:  Recent Labs Lab 07/24/17 1236 07/24/17 2016 07/25/17 0010  TROPONINI 0.06* 0.06* 0.07*   BNP (last 3 results) No results for input(s): PROBNP in the last 8760 hours. HbA1C:  Recent Labs  07/24/17 1436  HGBA1C 5.7*   CBG:  Recent Labs Lab 07/24/17 1050 07/24/17 1704 07/24/17 2157 07/25/17 0802 07/25/17 1117  GLUCAP 202* 170* 160* 115* 113*   Lipid Profile: No results for input(s): CHOL, HDL, LDLCALC, TRIG, CHOLHDL, LDLDIRECT in the last 72 hours. Thyroid Function Tests:  Recent Labs  07/24/17 1436  TSH 0.943   Anemia Panel: No results for input(s): VITAMINB12, FOLATE, FERRITIN, TIBC, IRON, RETICCTPCT in the last 72 hours. Sepsis Labs: No results for input(s): PROCALCITON, LATICACIDVEN in the last 168 hours.  No results found for this or any previous visit (from the past 240 hour(s)).       Radiology Studies: Dg Chest Port 1 View  Result Date: 07/24/2017 CLINICAL DATA:  Chest pain, weakness EXAM: PORTABLE CHEST 1 VIEW COMPARISON:  None. FINDINGS: Cardiomegaly. No confluent airspace opacities or effusions. Mild hyperinflation. No acute bony abnormality. IMPRESSION: Hyperinflation/COPD.  Cardiomegaly.  No active disease. Electronically Signed   By: Rolm Baptise M.D.   On: 07/24/2017 11:34        Scheduled Meds: . amLODipine  5 mg Oral Daily  . dorzolamide-timolol  1 drop Both Eyes BID  . feeding supplement (GLUCERNA SHAKE)  237 mL Oral BID BM  . furosemide  20 mg Intravenous QID  . insulin aspart   0-5 Units Subcutaneous QHS  . insulin aspart  0-9 Units Subcutaneous TID WC  . latanoprost  1 drop Both Eyes QHS  . levothyroxine  50 mcg Oral QAC breakfast  . pantoprazole (PROTONIX) IV  40 mg Intravenous Q12H  . simvastatin  20 mg Oral QPM  . sodium chloride flush  3 mL Intravenous Q12H   Continuous Infusions: . sodium chloride    . sodium chloride       LOS: 1 day    Time spent: 35 min     Mandy Agresta,  MD [facility] Pager [phone]  If 7PM-7AM, please contact night-coverage [email or web address] Password [email or web address] 07/25/2017, 2:00 PM

## 2017-07-25 NOTE — Op Note (Signed)
[facility] Patient Name: Mandy Larson Procedure Date : 07/25/2017 MRN: [id number] Attending MD: Docia Chuck. Henrene Pastor , MD Date of Birth: 17-May-1930 CSN: [id number] Age: 83 Admit Type: Inpatient Procedure:                Upper GI endoscopy, with biopsy Indications:              Melena Providers:                Docia Chuck. Henrene Pastor, MD, Kingsley Plan, RN, Corliss Parish, Technician Referring MD:             [facility] Medicines:                Monitored Anesthesia Care Complications:            No immediate complications. Estimated Blood Loss:     Estimated blood loss: none. Procedure:                Pre-Anesthesia Assessment:                           - Prior to the procedure, a History and Physical                            was performed, and patient medications and                            allergies were reviewed. The patient's tolerance of                            previous anesthesia was also reviewed. The risks                            and benefits of the procedure and the sedation                            options and risks were discussed with the patient.                            All questions were answered, and informed consent                            was obtained. Prior Anticoagulants: The patient has                            taken no previous anticoagulant or antiplatelet                            agents. ASA Grade Assessment: III - A patient with                            severe systemic disease. After reviewing the risks  and benefits, the patient was deemed in                            satisfactory condition to undergo the procedure.                           After obtaining informed consent, the endoscope was                            passed under direct vision. Throughout the                            procedure, the patient's blood pressure, pulse, and                            oxygen  saturations were monitored continuously. The                            EG-[id number] ([id number]) scope was introduced through the                            mouth, and advanced to the second part of duodenum.                            The upper GI endoscopy was accomplished without                            difficulty. The patient tolerated the procedure                            well. Scope In: Scope Out: Findings:      One moderate benign-appearing, large-caliber ringlike stricture was       found at the gastroesophageal junction.      The esophagus was otherwise normal.      One non-bleeding cratered gastric ulcer with no stigmata of bleeding was       found in the prepyloric region of the stomach. The lesion was 10 mm in       largest dimension. Biopsies were taken with a cold forceps for       Helicobacter pylori testing using CLOtest.      Small hiatal hernia.The stomach was normal.      The examined duodenum was normal.      The cardia and gastric fundus were normal on retroflexion, save small       hiatal hernia. Impression:               1. Clean-based pyloric ulcer without stigmata. No                            further bleeding.                           2. Incidental large caliber distal esophageal                            stricture  3. Otherwise normal EGD. Moderate Sedation:      none Recommendation:           1. Recommend pantoprazole (or equivalent PPI) 40 mg                            twice daily for 4 weeks, then 40 mg daily                            indefinitely.                           2. Okay to use daily baby aspirin if important                            medically in this patient.                           3. Avoid other NSAIDs                           4. Follow-up CLO biopsy and treat for Helicobacter                            pylori if positive                           5. Diet of choice                           6. Okay for  discharge from GI standpoint.                           7. NO specific GI follow-up is required in this                            patient. GI follow-up prn Dr. Loletha Carrow.                           GI WILL SIGN OFF. THANK YOU. Procedure Code(s):        --- Professional ---                           [phone], Esophagogastroduodenoscopy, flexible,                            transoral; with biopsy, single or multiple Diagnosis Code(s):        --- Professional ---                           K22.2, Esophageal obstruction                           K25.9, Gastric ulcer, unspecified as acute or                            chronic, without hemorrhage or perforation  K92.1, Melena (includes Hematochezia) CPT copyright 2016 [employer]. All rights reserved. The codes documented in this report are preliminary and upon coder review may  be revised to meet current compliance requirements. Docia Chuck. Henrene Pastor, MD 07/25/2017 11:53:40 AM This report has been signed electronically. Number of Addenda: 0

## 2017-07-25 NOTE — Anesthesia Postprocedure Evaluation (Signed)
Anesthesia Post Note  Patient: Brendolyn Lalor  Procedure(s) Performed: Procedure(s) (LRB): ESOPHAGOGASTRODUODENOSCOPY (EGD) (N/A)     Patient location during evaluation: Endoscopy Anesthesia Type: MAC Level of consciousness: awake and alert, patient cooperative and oriented Pain management: pain level controlled Vital Signs Assessment: post-procedure vital signs reviewed and stable Respiratory status: spontaneous breathing, nonlabored ventilation and respiratory function stable Cardiovascular status: blood pressure returned to baseline and stable Postop Assessment: no apparent nausea or vomiting Anesthetic complications: no    Last Vitals:  Vitals:   07/25/17 1054 07/25/17 1147  BP: (!) 160/62 (!) 138/56  Pulse:    Resp: (!) 24 19  Temp: 36.8 C 36.8 C  SpO2: 97% 97%    Last Pain:  Vitals:   07/25/17 1147  TempSrc: Oral                 Miken Stecher,E. Maxen Rowland

## 2017-07-25 NOTE — Anesthesia Preprocedure Evaluation (Addendum)
Anesthesia Evaluation  Patient identified by MRN, date of birth, ID band Patient awake    Reviewed: Allergy & Precautions, NPO status , Patient's Chart, lab work & pertinent test results, reviewed documented beta blocker date and time   History of Anesthesia Complications Negative for: history of anesthetic complications  Airway Mallampati: I  TM Distance: >3 FB Neck ROM: Full    Dental  (+) Edentulous Upper, Edentulous Lower   Pulmonary former smoker,    breath sounds clear to auscultation       Cardiovascular hypertension, Pt. on medications and Pt. on home beta blockers (-) angina+ CAD and + Past MI   Rhythm:Regular Rate:Normal  07/24/17 ECHO: EF 55-60%, mild MR, Pulmonary arteries: Systolic pressure was moderately increased.   PA peak pressure: 57 mm Hg (S).   Neuro/Psych negative neurological ROS     GI/Hepatic Neg liver ROS, melena   Endo/Other  diabetes (glu 115), Oral Hypoglycemic AgentsHypothyroidism   Renal/GU Renal InsufficiencyRenal disease (creat 3.72, K+ 3.5)     Musculoskeletal   Abdominal   Peds  Hematology Hb 9.7, plt 164k   Anesthesia Other Findings   Reproductive/Obstetrics                            Anesthesia Physical Anesthesia Plan  ASA: III  Anesthesia Plan: MAC   Post-op Pain Management:    Induction: Intravenous  PONV Risk Score and Plan: 2 and Ondansetron  Airway Management Planned: Natural Airway and Nasal Cannula  Additional Equipment:   Intra-op Plan:   Post-operative Plan:   Informed Consent: I have reviewed the patients History and Physical, chart, labs and discussed the procedure including the risks, benefits and alternatives for the proposed anesthesia with the patient or authorized representative who has indicated his/her understanding and acceptance.     Plan Discussed with: CRNA and Surgeon  Anesthesia Plan Comments: (Plan routine  monitors, MAC)        Anesthesia Quick Evaluation

## 2017-07-25 NOTE — Progress Notes (Signed)
Progress Note  Patient Name: Mandy Larson Date of Encounter: 07/25/2017  Primary Cardiologist: Nahser  Subjective   Tolerated transfusion well. Denies dyspnea or dizziness or angina.  Inpatient Medications    Scheduled Meds: . dorzolamide-timolol  1 drop Both Eyes BID  . furosemide  20 mg Intravenous QID  . insulin aspart  0-5 Units Subcutaneous QHS  . insulin aspart  0-9 Units Subcutaneous TID WC  . latanoprost  1 drop Both Eyes QHS  . levothyroxine  50 mcg Oral QAC breakfast  . pantoprazole (PROTONIX) IV  40 mg Intravenous Q12H  . simvastatin  20 mg Oral QPM  . sodium chloride flush  3 mL Intravenous Q12H   Continuous Infusions: . sodium chloride    . sodium chloride     PRN Meds: sodium chloride, acetaminophen **OR** acetaminophen, ondansetron **OR** ondansetron (ZOFRAN) IV, sodium chloride flush   Vital Signs    Vitals:   07/25/17 0308 07/25/17 0340 07/25/17 0559 07/25/17 0805  BP: (!) 132/56 (!) 147/57 132/64 (!) 152/60  Pulse: 74 75 72 74  Resp: 18 18 18 16   Temp: 98.5 F (36.9 C) 98.3 F (36.8 C) 98.6 F (37 C) 98 F (36.7 C)  TempSrc: Oral Oral Oral   SpO2: 96% 96% 99% 99%  Weight:  152 lb 6.4 oz (69.1 kg)    Height:        Intake/Output Summary (Last 24 hours) at 07/25/17 0926 Last data filed at 07/25/17 0818  Gross per 24 hour  Intake              667 ml  Output              406 ml  Net              261 ml   Filed Weights   07/24/17 1036 07/25/17 0340  Weight: 152 lb (68.9 kg) 152 lb 6.4 oz (69.1 kg)    Telemetry    NSR - Personally Reviewed  ECG    NSR, RBBB, LAFB, no ischemic repol changes - Personally Reviewed  Physical Exam  Smiling, relaxed GEN: No acute distress.   Neck: No JVD Cardiac: RRR, no murmurs, rubs, or gallops.  Respiratory: Clear to auscultation bilaterally. GI: Soft, nontender, non-distended  MS: No edema; No deformity. Neuro:  Nonfocal  Psych: Normal affect   Labs    Chemistry Recent Labs Lab  07/24/17 1101 07/25/17 0010  NA 137 138  K 3.8 3.5  CL 107 107  CO2 22 22  GLUCOSE 200* 132*  BUN 108* 100*  CREATININE 3.88* 3.72*  CALCIUM 10.1 9.7  PROT 5.8*  --   ALBUMIN 3.0*  --   AST 20  --   ALT 12*  --   ALKPHOS 25*  --   BILITOT 0.3  --   GFRNONAA 10* 10*  GFRAA 11* 12*  ANIONGAP 8 9     Hematology Recent Labs Lab 07/24/17 1101 07/24/17 2016 07/25/17 0010 07/25/17 0739  WBC 7.2  --  6.7  --   RBC 1.70*  --  2.51*  --   HGB 4.6* 6.9* 7.0* 9.7*  HCT 15.0* 21.2* 21.7* 28.6*  MCV 88.2  --  86.5  --   MCH 27.1  --  27.9  --   MCHC 30.7  --  32.3  --   RDW 16.4*  --  15.7*  --   PLT 193  --  164  --     Cardiac Enzymes  Recent Labs Lab 07/24/17 1236 07/24/17 2016 07/25/17 0010  TROPONINI 0.06* 0.06* 0.07*    Recent Labs Lab 07/24/17 1054  TROPIPOC 0.05     BNP Recent Labs Lab 07/24/17 1114  BNP 115.6*     DDimer No results for input(s): DDIMER in the last 168 hours.   Radiology    Dg Chest Port 1 View  Result Date: 07/24/2017 CLINICAL DATA:  Chest pain, weakness EXAM: PORTABLE CHEST 1 VIEW COMPARISON:  None. FINDINGS: Cardiomegaly. No confluent airspace opacities or effusions. Mild hyperinflation. No acute bony abnormality. IMPRESSION: Hyperinflation/COPD.  Cardiomegaly.  No active disease. Electronically Signed   By: Rolm Baptise M.D.   On: 07/24/2017 11:34    Cardiac Studies    Echocardiogram 07/24/17: Study Conclusions - Left ventricle: The cavity size was normal. There was mild focal basal hypertrophy of the septum. Systolic function was normal. The estimated ejection fraction was in the range of 55% to 60%. Wall motion was normal; there were no regional wall motion abnormalities. Doppler parameters are consistent with abnormal left ventricular relaxation (grade 1 diastolic dysfunction). Doppler parameters are consistent with high ventricular filling pressure. - Aortic valve: Transvalvular velocity was within the  normal range. There was no stenosis. There was no regurgitation. - Mitral valve: Transvalvular velocity was within the normal range. There was no evidence for stenosis. There was mild regurgitation. - Left atrium: The atrium was moderately to severely dilated. - Right ventricle: The cavity size was normal. Wall thickness was normal. Systolic function was normal. - Tricuspid valve: There was no regurgitation. - Pulmonary arteries: Systolic pressure was moderately increased. PA peak pressure: 57 mm Hg (S). - Pericardium, extracardiac: A trivial pericardial effusion was identified.   Echocardiogram 11/07/12: Study Conclusions - Left ventricle: The cavity size was mildly dilated. Systolic function was moderately reduced. The estimated ejection fraction was in the range of 35% to 40%. There is akinesis of the entire inferior and inferoseptal myocardium. Features are consistent with a pseudonormal left ventricular filling pattern, with concomitant abnormal relaxation and increased filling pressure (grade 2 diastolic dysfunction). - Mitral valve: Calcified annulus. There was likely dysfunction/tethering of the posterior papillary muscle as a result of inferior infarct. Severe regurgitation directed centrally and posteriorly. - Right ventricle: The cavity size was mildly dilated. Wall thickness was normal. - Pulmonic valve: Moderate regurgitation. - Pulmonary arteries: Systolic pressure was moderately increased. PA peak pressure: 33mm Hg (S). - Pericardium, extracardiac: A small, free-flowing pericardial effusion was identified circumferential to the heart. There was no evidence of hemodynamic compromise.  Patient Profile     83 y.o. female with CAD, CHF, history of LV systolic dysfunction and MR (both resolved by current echo), presented with syncope in the setting of severe anemia and melena.  Assessment & Plan    1. GI bleed: no ongoing  melena; Hgb up to 9.7 after transfusion. EGD today. Hold ASA until GI evaluation allows. 2. CHF:  No evidence of volume overload by clinical exam after transfusion and diuretics 3. CAD: minor troponin leak is not indicative of true acute coronary event 4. HTN: will gradually reintroduce BP meds which were held due to hypotension. Restart amlodipine. Resume carvedilol tomorrow if BP allows. Hold off ACEi until renal function is better.  For questions or updates, please contact [place] Please consult [email or web address] for contact info under Cardiology/STEMI.      Signed, Sanda Klein, MD  07/25/2017, 9:26 AM

## 2017-07-25 NOTE — Transfer of Care (Signed)
Immediate Anesthesia Transfer of Care Note  Patient: Mandy Larson  Procedure(s) Performed: Procedure(s): ESOPHAGOGASTRODUODENOSCOPY (EGD) (N/A)  Patient Location: PACU  Anesthesia Type:MAC  Level of Consciousness: awake and alert   Airway & Oxygen Therapy: Patient Spontanous Breathing  Post-op Assessment: Report given to RN and Post -op Vital signs reviewed and stable  Post vital signs: Reviewed and stable  Last Vitals:  Vitals:   07/25/17 0805 07/25/17 1054  BP: (!) 152/60 (!) 160/62  Pulse: 74   Resp: 16 (!) 24  Temp: 36.7 C 36.8 C  SpO2: 99% 97%    Last Pain:  Vitals:   07/25/17 1054  TempSrc: Oral         Complications: No apparent anesthesia complications

## 2017-07-25 NOTE — Progress Notes (Signed)
Initial Nutrition Assessment  DOCUMENTATION CODES:   Not applicable  INTERVENTION:   -Glucerna Shake po BID, each supplement provides 220 kcal and 10 grams of protein  NUTRITION DIAGNOSIS:   Inadequate oral intake related to poor appetite as evidenced by per patient/family report.  GOAL:   Patient will meet greater than or equal to 90% of their needs  MONITOR:   PO intake, Supplement acceptance, Labs, Weight trends, Skin, I & O's  REASON FOR ASSESSMENT:   Malnutrition Screening Tool    ASSESSMENT:   Mandy Larson is a delightful 83 y.o. female with medical history significant for CAD, chronic combined systolic diastolic heart failure, severe mitral regurg, hypertension, diabetes, chronic kidney disease presents to the emergency Department chief complaint gradual worsening fatigue, generalized weakness, shortness of breath with exertion, chest pain and melena.  Pt admitted with acute blood loss anemia.   Pt underwent upper GI endoscopy this AM; findings revealed esophageal stricture and GE junction and pyloric ulcer.  Spoke with pt at bedside. She reports that she generally consumed 2 meals per day, however, appetite has been poor the past 3 weeks. She reports she just didn't have an appetite. Typical meal intake at this time was coffee and a few crackers at breakfast and sipping on liquids throughout the day. Pt shares she intermittently consumes Glucerna supplements at baseline, however, consumed them more frequently to optimize nutritional status.   Pt denies any wt loss, reporting UBW around 150# which is consistent with documented wt hx.   Nutrition-Focused physical exam completed. Findings are no fat depletion, no muscle depletion, and no edema.   Pt reports feeling hungry and ready to eat. Discussed importance of good meal and supplement intake to promote healing.   Labs reviewed: CBGS: 113-115. DM well controlled (last Hgb A1c: 5.7 on 07/24/17). PTA DM medications 2.5  mg glipizide daily. Current DM medications 0-5 units insulin aspart daily at bedtime and 0-9 units insulin aspart TID.   Diet Order:  Diet Carb Modified Fluid consistency: Thin; Room service appropriate? Yes  Skin:  Reviewed, no issues  Last BM:  07/25/17  Height:   Ht Readings from Last 1 Encounters:  07/25/17 5\' 3"  (1.6 m)    Weight:   Wt Readings from Last 1 Encounters:  07/25/17 152 lb (68.9 kg)    Ideal Body Weight:  52.3 kg  BMI:  Body mass index is 26.93 kg/m.  Estimated Nutritional Needs:   Kcal:  1500-1700  Protein:  75-90 grams  Fluid:  1.5-1.7 L  EDUCATION NEEDS:   Education needs addressed  Zya Finkle A. Jimmye Norman, RD, LDN, CDE Pager: [phone] After hours Pager: [phone]

## 2017-07-26 ENCOUNTER — Encounter (HOSPITAL_COMMUNITY): Payer: Self-pay | Admitting: Internal Medicine

## 2017-07-26 LAB — BASIC METABOLIC PANEL
ANION GAP: 9 (ref 5–15)
BUN: 85 mg/dL — ABNORMAL HIGH (ref 6–20)
CO2: 26 mmol/L (ref 22–32)
Calcium: 9.1 mg/dL (ref 8.9–10.3)
Chloride: 105 mmol/L (ref 101–111)
Creatinine, Ser: 3.91 mg/dL — ABNORMAL HIGH (ref 0.44–1.00)
GFR, EST AFRICAN AMERICAN: 11 mL/min — AB (ref >60)
GFR, EST NON AFRICAN AMERICAN: 10 mL/min — AB (ref >60)
Glucose, Bld: 173 mg/dL — ABNORMAL HIGH (ref 65–99)
POTASSIUM: 3.3 mmol/L — AB (ref 3.5–5.1)
Sodium: 140 mmol/L (ref 135–145)

## 2017-07-26 LAB — GLUCOSE, CAPILLARY
GLUCOSE-CAPILLARY: 108 mg/dL — AB (ref 65–99)
Glucose-Capillary: 163 mg/dL — ABNORMAL HIGH (ref 65–99)

## 2017-07-26 LAB — CLOTEST (H. PYLORI), BIOPSY: HELICOBACTER SCREEN: NEGATIVE

## 2017-07-26 LAB — HEMOGLOBIN AND HEMATOCRIT, BLOOD
HCT: 32.1 % — ABNORMAL LOW (ref 36.0–46.0)
Hemoglobin: 10.6 g/dL — ABNORMAL LOW (ref 12.0–15.0)

## 2017-07-26 MED ORDER — ASPIRIN EC 81 MG PO TBEC: 81.0000 mg | DELAYED_RELEASE_TABLET | Freq: Every day | ORAL | Status: DC

## 2017-07-26 MED ORDER — POTASSIUM CHLORIDE CRYS ER 20 MEQ PO TBCR
40.0000 meq | EXTENDED_RELEASE_TABLET | Freq: Once | ORAL | Status: AC
  Administered 2017-07-26: 40 meq via ORAL
  Filled 2017-07-26: qty 2

## 2017-07-26 MED ORDER — PANTOPRAZOLE SODIUM 40 MG PO TBEC: 40.0000 mg | DELAYED_RELEASE_TABLET | Freq: Two times a day (BID) | ORAL | 0 refills | Status: DC

## 2017-07-26 MED ORDER — PANTOPRAZOLE SODIUM 40 MG PO TBEC: 40.0000 mg | DELAYED_RELEASE_TABLET | Freq: Every day | ORAL | 1 refills | Status: DC

## 2017-07-26 MED ORDER — GLUCERNA SHAKE PO LIQD: 237.0000 mL | Freq: Two times a day (BID) | ORAL | 0 refills | Status: AC

## 2017-07-26 MED ORDER — CARVEDILOL 3.125 MG PO TABS: 3.1250 mg | ORAL_TABLET | Freq: Two times a day (BID) | ORAL | Status: DC

## 2017-07-26 NOTE — Progress Notes (Signed)
Hemoglobin stable. EGD shows gastric ulcer without active bleeding, but likely source of recent melena. Blood pressure recovering nicely. Will resume carvedilol. Hold off ARB until renal function recovers. I think holding aspirin for a few weeks, to allow the gastric ulcer to heal will be reasonably safe. She has not had any recent acute coronary events or revascularization procedures. No further recommendations at this time, please reconsult if needed.  Sanda Klein, MD, [facility] [phone] office [phone] pager

## 2017-07-26 NOTE — Progress Notes (Signed)
Patient was discharged home by MD order; discharged instructions review and give to patient with care notes; IV DIC;  patient will be escorted to the car by nurse tech via wheelchair.  

## 2017-07-27 ENCOUNTER — Other Ambulatory Visit: Payer: Medicare HMO

## 2017-07-27 ENCOUNTER — Telehealth: Payer: Self-pay | Admitting: Physician Assistant

## 2017-07-27 ENCOUNTER — Encounter (INDEPENDENT_AMBULATORY_CARE_PROVIDER_SITE_OTHER): Payer: Medicare HMO | Admitting: Podiatry

## 2017-07-27 LAB — TYPE AND SCREEN
ABO/RH(D): O POS
ANTIBODY SCREEN: NEGATIVE
UNIT DIVISION: 0
Unit division: 0
Unit division: 0
Unit division: 0
Unit division: 0

## 2017-07-27 LAB — BPAM RBC
BLOOD PRODUCT EXPIRATION DATE: 201810052359
BLOOD PRODUCT EXPIRATION DATE: 201810082359
BLOOD PRODUCT EXPIRATION DATE: 201810082359
Blood Product Expiration Date: 201810062359
Blood Product Expiration Date: 201810092359
ISSUE DATE / TIME: 201809141146
ISSUE DATE / TIME: 201809141419
ISSUE DATE / TIME: 201809150026
ISSUE DATE / TIME: 201809150310
UNIT TYPE AND RH: 5100
UNIT TYPE AND RH: 5100
UNIT TYPE AND RH: 5100
Unit Type and Rh: 5100
Unit Type and Rh: 5100

## 2017-07-27 NOTE — Telephone Encounter (Signed)
New message       Pt is due to have labs drawn.  She was in the hosp over the weekend.   Calling to see if she still needed to come and have labs drawn

## 2017-07-27 NOTE — Progress Notes (Signed)
This encounter was created in error - please disregard.

## 2017-07-27 NOTE — Telephone Encounter (Signed)
I returned pt's call in regards lab work scheduled for today. Pt wanted to know if she still had to have lab work since she was in the hospital over the weekend. I advised pt since the lab work our office was checking is cholesterol, liver and kidney function yes we will still need lab work done. I did inform pt that kidney function was checked while in the hospital though her K+ is just slighty below normal and so we really should recheck K+ level as well. Pt is agreeable to this plan of care, though did ask could she please get lab work done on Wed since she was just d/c from hospital. Pt will come in first thing Wed AM 9/19. Pt thanked me for my help today.

## 2017-07-27 NOTE — Discharge Summary (Signed)
Physician Discharge Summary  Mandy Larson VHQ:[id number] DOB: [birth year] DOA: 07/24/2017  PCP: [name], MD  Admit date: 07/24/2017 Discharge date: 07/26/2017  Admitted From: Home.  Disposition:  Home.   Recommendations for Outpatient Follow-up:  1. Follow up with PCP in 1-2 weeks 2. Please obtain BMP/CBC in one week Please follow up with GI in 1 to 2 weeks regarding the biopsy results.   Discharge Condition:stable.  CODE STATUS: PARTIAL CODE Diet recommendation: Heart Healthy   Brief/Interim Summary:  Mandy Larson a delightful 83 y.o.femalewith medical history significant for CAD, chronic combined systolic diastolic heart failure, severe mitral regurg, hypertension, diabetes, chronic kidney disease presents to the ED with  shortness of breath with exertion, chest pain and melena. Initial evaluation reveals a hemoglobin of 4.8 creatinine of 3.8 .  [facility] called to admit.  She received 4 units of prbc transfusion and her repeat H&H is 9.7. GI consulted and she underwent EGD showing esophageal stricture and pyloric ulcer. Repeat H&H in am is stable and discharge home.  Discharge Diagnoses:  Principal Problem:   Acute blood loss anemia Active Problems:   Chest pain   DM2 (diabetes mellitus, type 2) (HCC)   CKD (chronic kidney disease) stage 4, GFR 15-29 ml/min (HCC)   Chronic combined systolic and diastolic CHF (congestive heart failure) (HCC)   CAD (coronary artery disease)   Melena   Hypotension   Syncope   Acute gastric ulcer with hemorrhage  Symptomatic anemia probably from PUD:  S/p 4 units of prbc transfusion.  Anemia panel outpatient, to check iron studies and ferritin.  GI consulted and underwent EGD showing pyloric ulcer and esophageal stricture.  Back on regular diet .  Recommended PPI BID for 4 weeks followed by PPI daily indefinitely. Marland Kitchen     DIABETES mellitus:  CBG (last 3)   Recent Labs (last 2 labs)    Recent Labs  07/24/17 2157  07/25/17 0802 07/25/17 1117  GLUCAP 160* 115* 113*      Resume SSI.    Chronic systolic and diastolic heart failure:  She appears compensated.    Hypotension: resolved.   Stage 4 CKD:  Creatinine at baseline.     Chest pain:  Possibly from symptomatic anemia/ demand ischemia.  Elevated troponins from demand ischemia:  No further intervention at this time.     Discharge Instructions   Allergies as of 07/26/2017   No Known Allergies     Medication List    STOP taking these medications   lisinopril 5 MG tablet Commonly known as:  PRINIVIL,ZESTRIL     TAKE these medications   amLODipine 5 MG tablet Commonly known as:  NORVASC Take 5 mg by mouth daily.   aspirin EC 81 MG tablet Take 1 tablet (81 mg total) by mouth daily. Start taking on:  07/31/2017 What changed:  These instructions start on 07/31/2017. If you are unsure what to do until then, ask your doctor or other care provider.   CALTRATE 600+D 600-800 MG-UNIT Tabs Generic drug:  Calcium Carb-Cholecalciferol Take 1 tablet by mouth daily.   carvedilol 3.125 MG tablet Commonly known as:  COREG Take 1 tablet (3.125 mg total) by mouth 2 (two) times daily with a meal.   CENTRUM SILVER PO Take 1 tablet by mouth daily. Dose Unknown   dorzolamide-timolol 22.3-6.8 MG/ML ophthalmic solution Commonly known as:  COSOPT Place 1 drop into both eyes 2 (two) times daily.   feeding supplement (GLUCERNA SHAKE) Liqd Take 237 mLs by mouth 2 (  two) times daily between meals.   furosemide 20 MG tablet Commonly known as:  LASIX Take 1 tablet (20 mg total) by mouth daily.   glipiZIDE 5 MG tablet Commonly known as:  GLUCOTROL Take 2.5 mg by mouth daily before breakfast. TAKE 1/2 TABLET BY MOUTH ONE TIME DAILY   isosorbide mononitrate 30 MG 24 hr tablet Commonly known as:  IMDUR Take 1 tablet (30 mg total) by mouth daily.   latanoprost 0.005 % ophthalmic solution Commonly known as:  XALATAN Place 1  drop into both eyes at bedtime.   levothyroxine 50 MCG tablet Commonly known as:  SYNTHROID, LEVOTHROID Take 50 mcg by mouth daily.   pantoprazole 40 MG tablet Commonly known as:  PROTONIX Take 1 tablet (40 mg total) by mouth 2 (two) times daily.   pantoprazole 40 MG tablet Commonly known as:  PROTONIX Take 1 tablet (40 mg total) by mouth daily. Start taking on:  08/27/2017   simvastatin 20 MG tablet Commonly known as:  ZOCOR Take 20 mg by mouth every evening.            Discharge Care Instructions        Start     Ordered   08/27/17 0000  pantoprazole (PROTONIX) 40 MG tablet  Daily     07/26/17 1146   07/31/17 0000  aspirin EC 81 MG tablet  Daily     07/26/17 1143   07/27/17 0000  pantoprazole (PROTONIX) 40 MG tablet  2 times daily     07/26/17 1146   07/26/17 0000  feeding supplement, GLUCERNA SHAKE, (GLUCERNA SHAKE) LIQD  2 times daily between meals     07/26/17 1143     Follow-up Information    [name], MD. Schedule an appointment as soon as possible for a visit in 1 week(s).   Specialty:  Internal Medicine Contact information: [place], [place] [address] [phone]        [name], MD. Schedule an appointment as soon as possible for a visit in 1 week(s).   Specialty:  Gastroenterology Why:  follow up biopsy report.  Contact information: [address] [place] [address] [phone]          No Known Allergies  Consultations:  Cardiology  GI.    Procedures/Studies: Dg Chest Port 1 View  Result Date: 07/24/2017 CLINICAL DATA:  Chest pain, weakness EXAM: PORTABLE CHEST 1 VIEW COMPARISON:  None. FINDINGS: Cardiomegaly. No confluent airspace opacities or effusions. Mild hyperinflation. No acute bony abnormality. IMPRESSION: Hyperinflation/COPD.  Cardiomegaly.  No active disease. Electronically Signed   By: Rolm Baptise M.D.   On: 07/24/2017 11:34    EGD showing esophageal stricture and pyloric ulcer.     Subjective: No new complaints.   Discharge Exam: Vitals:   07/25/17 2137 07/26/17 0530  BP: (!) 145/59 (!) 150/55  Pulse: 63 60  Resp: 18 17  Temp: 98.2 F (36.8 C) 98.5 F (36.9 C)  SpO2: 98% 97%   Vitals:   07/25/17 1605 07/25/17 2137 07/26/17 0355 07/26/17 0530  BP: (!) 144/50 (!) 145/59  (!) 150/55  Pulse: 65 63  60  Resp: 17 18  17   Temp: 97.9 F (36.6 C) 98.2 F (36.8 C)  98.5 F (36.9 C)  TempSrc: Oral Oral  Oral  SpO2: 99% 98%  97%  Weight:   68.3 kg (150 lb 9.6 oz)   Height:        General: Pt is alert, awake, not in  acute distress Cardiovascular: RRR, S1/S2 +, no rubs, no gallops Respiratory: CTA bilaterally, no wheezing, no rhonchi Abdominal: Soft, NT, ND, bowel sounds + Extremities: no edema, no cyanosis    The results of significant diagnostics from this hospitalization (including imaging, microbiology, ancillary and laboratory) are listed below for reference.     Microbiology: No results found for this or any previous visit (from the past 240 hour(s)).   Labs: BNP (last 3 results)  Recent Labs  07/24/17 1114  BNP 622.2*   Basic Metabolic Panel:  Recent Labs Lab 07/24/17 1101 07/25/17 0010 07/26/17 1145  NA 137 138 140  K 3.8 3.5 3.3*  CL 107 107 105  CO2 22 22 26   GLUCOSE 200* 132* 173*  BUN 108* 100* 85*  CREATININE 3.88* 3.72* 3.91*  CALCIUM 10.1 9.7 9.1   Liver Function Tests:  Recent Labs Lab 07/24/17 1101  AST 20  ALT 12*  ALKPHOS 25*  BILITOT 0.3  PROT 5.8*  ALBUMIN 3.0*   No results for input(s): LIPASE, AMYLASE in the last 168 hours. No results for input(s): AMMONIA in the last 168 hours. CBC:  Recent Labs Lab 07/24/17 1101 07/24/17 2016 07/25/17 0010 07/25/17 0739 07/26/17 0653  WBC 7.2  --  6.7  --   --   HGB 4.6* 6.9* 7.0* 9.7* 10.6*  HCT 15.0* 21.2* 21.7* 28.6* 32.1*  MCV 88.2  --  86.5  --   --   PLT 193  --  164  --   --    Cardiac Enzymes:  Recent Labs Lab 07/24/17 1236  07/24/17 2016 07/25/17 0010  TROPONINI 0.06* 0.06* 0.07*   BNP: Invalid input(s): POCBNP CBG:  Recent Labs Lab 07/25/17 1117 07/25/17 1645 07/25/17 2228 07/26/17 0740 07/26/17 1156  GLUCAP 113* 142* 110* 108* 163*   D-Dimer No results for input(s): DDIMER in the last 72 hours. Hgb A1c  Recent Labs  07/24/17 1436  HGBA1C 5.7*   Lipid Profile No results for input(s): CHOL, HDL, LDLCALC, TRIG, CHOLHDL, LDLDIRECT in the last 72 hours. Thyroid function studies  Recent Labs  07/24/17 1436  TSH 0.943   Anemia work up No results for input(s): VITAMINB12, FOLATE, FERRITIN, TIBC, IRON, RETICCTPCT in the last 72 hours. Urinalysis No results found for: COLORURINE, APPEARANCEUR, LABSPEC, [place], GLUCOSEU, HGBUR, BILIRUBINUR, KETONESUR, PROTEINUR, UROBILINOGEN, NITRITE, LEUKOCYTESUR Sepsis Labs Invalid input(s): PROCALCITONIN,  WBC,  LACTICIDVEN Microbiology No results found for this or any previous visit (from the past 240 hour(s)).   Time coordinating discharge: Over 30 minutes  SIGNED:   Hosie Poisson, MD  [facility] 07/27/2017, 7:41 AM Pager   If 7PM-7AM, please contact night-coverage [email or web address] Password [email or web address]

## 2017-07-28 ENCOUNTER — Other Ambulatory Visit: Payer: Medicare HMO

## 2017-07-29 ENCOUNTER — Ambulatory Visit: Payer: Medicare HMO | Admitting: Podiatry

## 2017-07-29 ENCOUNTER — Other Ambulatory Visit: Payer: Medicare HMO | Admitting: *Deleted

## 2017-07-29 DIAGNOSIS — I200 Unstable angina: Secondary | ICD-10-CM

## 2017-07-29 DIAGNOSIS — G903 Multi-system degeneration of the autonomic nervous system: Secondary | ICD-10-CM | POA: Diagnosis not present

## 2017-07-29 DIAGNOSIS — I2510 Atherosclerotic heart disease of native coronary artery without angina pectoris: Secondary | ICD-10-CM | POA: Diagnosis not present

## 2017-07-29 DIAGNOSIS — N184 Chronic kidney disease, stage 4 (severe): Secondary | ICD-10-CM

## 2017-07-29 LAB — COMPREHENSIVE METABOLIC PANEL
A/G RATIO: 1.4 (ref 1.2–2.2)
ALT: 17 IU/L (ref 0–32)
AST: 24 IU/L (ref 0–40)
Albumin: 3.7 g/dL (ref 3.5–4.7)
Alkaline Phosphatase: 44 IU/L (ref 39–117)
BILIRUBIN TOTAL: 0.4 mg/dL (ref 0.0–1.2)
BUN/Creatinine Ratio: 17 (ref 12–28)
BUN: 63 mg/dL — AB (ref 8–27)
CHLORIDE: 106 mmol/L (ref 96–106)
CO2: 23 mmol/L (ref 20–29)
Calcium: 8.7 mg/dL (ref 8.7–10.3)
Creatinine, Ser: 3.79 mg/dL — ABNORMAL HIGH (ref 0.57–1.00)
GFR calc Af Amer: 12 mL/min/{1.73_m2} — ABNORMAL LOW (ref >59)
GFR calc non Af Amer: 10 mL/min/{1.73_m2} — ABNORMAL LOW (ref >59)
GLOBULIN, TOTAL: 2.6 g/dL (ref 1.5–4.5)
Glucose: 148 mg/dL — ABNORMAL HIGH (ref 65–99)
POTASSIUM: 4.1 mmol/L (ref 3.5–5.2)
Sodium: 144 mmol/L (ref 134–144)
TOTAL PROTEIN: 6.3 g/dL (ref 6.0–8.5)

## 2017-07-29 LAB — LIPID PANEL
CHOLESTEROL TOTAL: 115 mg/dL (ref 100–199)
Chol/HDL Ratio: 1.9 ratio (ref 0.0–4.4)
HDL: 61 mg/dL (ref >39)
LDL Calculated: 43 mg/dL (ref 0–99)
TRIGLYCERIDES: 55 mg/dL (ref 0–149)
VLDL CHOLESTEROL CAL: 11 mg/dL (ref 5–40)

## 2017-07-30 ENCOUNTER — Telehealth: Payer: Self-pay | Admitting: *Deleted

## 2017-07-30 DIAGNOSIS — N184 Chronic kidney disease, stage 4 (severe): Secondary | ICD-10-CM

## 2017-07-30 DIAGNOSIS — I5042 Chronic combined systolic (congestive) and diastolic (congestive) heart failure: Secondary | ICD-10-CM

## 2017-07-30 NOTE — Telephone Encounter (Signed)
Pt has been notified of lab results by phone. Pt agreeable to repeat BMET 08/06/17. Pt thanked me for my call today.

## 2017-07-30 NOTE — Telephone Encounter (Signed)
-----   Message from Liliane Shi, [place] sent at 07/30/2017  8:41 AM EDT ----- Please call the patient Lipids are at goal. LFTs are normal. Kidney function is slightly improved. Repeat BMET 1 week. Richardson Dopp, PA-C    07/30/2017 8:41 AM

## 2017-08-01 DIAGNOSIS — R69 Illness, unspecified: Secondary | ICD-10-CM | POA: Diagnosis not present

## 2017-08-06 ENCOUNTER — Other Ambulatory Visit: Payer: Medicare HMO | Admitting: *Deleted

## 2017-08-06 DIAGNOSIS — N184 Chronic kidney disease, stage 4 (severe): Secondary | ICD-10-CM

## 2017-08-06 DIAGNOSIS — I5042 Chronic combined systolic (congestive) and diastolic (congestive) heart failure: Secondary | ICD-10-CM

## 2017-08-06 LAB — BASIC METABOLIC PANEL
BUN/Creatinine Ratio: 12 (ref 12–28)
BUN: 39 mg/dL — AB (ref 8–27)
CALCIUM: 8.6 mg/dL — AB (ref 8.7–10.3)
CHLORIDE: 106 mmol/L (ref 96–106)
CO2: 20 mmol/L (ref 20–29)
Creatinine, Ser: 3.34 mg/dL — ABNORMAL HIGH (ref 0.57–1.00)
GFR calc non Af Amer: 12 mL/min/{1.73_m2} — ABNORMAL LOW (ref >59)
GFR, EST AFRICAN AMERICAN: 14 mL/min/{1.73_m2} — AB (ref >59)
Glucose: 110 mg/dL — ABNORMAL HIGH (ref 65–99)
Potassium: 4.5 mmol/L (ref 3.5–5.2)
Sodium: 143 mmol/L (ref 134–144)

## 2017-08-07 ENCOUNTER — Telehealth: Payer: Self-pay

## 2017-08-07 NOTE — Telephone Encounter (Signed)
-----   Message from Liliane Shi, [place] sent at 08/06/2017  4:55 PM EDT ----- Please call the patient Kidney function is improved.   Continue with current treatment plan. Fax labs to PCP.  Richardson Dopp, PA-C   08/06/2017 4:55 PM

## 2017-08-07 NOTE — Telephone Encounter (Signed)
Spoke with patient and advised of lab results with verbal understanding. Patient had no questions. Will fax results to PCP.

## 2017-08-11 DIAGNOSIS — H9113 Presbycusis, bilateral: Secondary | ICD-10-CM | POA: Diagnosis not present

## 2017-08-11 DIAGNOSIS — Z1211 Encounter for screening for malignant neoplasm of colon: Secondary | ICD-10-CM | POA: Diagnosis not present

## 2017-08-11 DIAGNOSIS — R42 Dizziness and giddiness: Secondary | ICD-10-CM | POA: Diagnosis not present

## 2017-08-11 DIAGNOSIS — I70219 Atherosclerosis of native arteries of extremities with intermittent claudication, unspecified extremity: Secondary | ICD-10-CM | POA: Diagnosis not present

## 2017-08-11 DIAGNOSIS — R002 Palpitations: Secondary | ICD-10-CM | POA: Diagnosis not present

## 2017-08-11 DIAGNOSIS — Z1389 Encounter for screening for other disorder: Secondary | ICD-10-CM | POA: Diagnosis not present

## 2017-08-11 DIAGNOSIS — E1165 Type 2 diabetes mellitus with hyperglycemia: Secondary | ICD-10-CM | POA: Diagnosis not present

## 2017-08-11 DIAGNOSIS — E785 Hyperlipidemia, unspecified: Secondary | ICD-10-CM | POA: Diagnosis not present

## 2017-08-11 DIAGNOSIS — R1032 Left lower quadrant pain: Secondary | ICD-10-CM | POA: Diagnosis not present

## 2017-08-11 DIAGNOSIS — Z0000 Encounter for general adult medical examination without abnormal findings: Secondary | ICD-10-CM | POA: Diagnosis not present

## 2017-08-12 DIAGNOSIS — Z131 Encounter for screening for diabetes mellitus: Secondary | ICD-10-CM | POA: Diagnosis not present

## 2017-08-12 DIAGNOSIS — E7800 Pure hypercholesterolemia, unspecified: Secondary | ICD-10-CM | POA: Diagnosis not present

## 2017-08-12 DIAGNOSIS — R5383 Other fatigue: Secondary | ICD-10-CM | POA: Diagnosis not present

## 2017-08-12 DIAGNOSIS — Z79899 Other long term (current) drug therapy: Secondary | ICD-10-CM | POA: Diagnosis not present

## 2017-08-12 DIAGNOSIS — E039 Hypothyroidism, unspecified: Secondary | ICD-10-CM | POA: Diagnosis not present

## 2017-09-07 DIAGNOSIS — I10 Essential (primary) hypertension: Secondary | ICD-10-CM | POA: Diagnosis not present

## 2017-09-16 DIAGNOSIS — I10 Essential (primary) hypertension: Secondary | ICD-10-CM | POA: Diagnosis not present

## 2017-09-16 DIAGNOSIS — N184 Chronic kidney disease, stage 4 (severe): Secondary | ICD-10-CM | POA: Diagnosis not present

## 2017-09-16 DIAGNOSIS — I509 Heart failure, unspecified: Secondary | ICD-10-CM | POA: Diagnosis not present

## 2017-09-29 ENCOUNTER — Other Ambulatory Visit (HOSPITAL_COMMUNITY): Payer: Self-pay | Admitting: *Deleted

## 2017-09-30 ENCOUNTER — Ambulatory Visit (HOSPITAL_COMMUNITY)
Admission: RE | Admit: 2017-09-30 | Discharge: 2017-09-30 | Disposition: A | Discharge status: Home / Self Care | Payer: Medicare HMO | Source: Ambulatory Visit | Attending: Nephrology | Admitting: Nephrology

## 2017-09-30 DIAGNOSIS — N189 Chronic kidney disease, unspecified: Secondary | ICD-10-CM | POA: Insufficient documentation

## 2017-09-30 DIAGNOSIS — D631 Anemia in chronic kidney disease: Secondary | ICD-10-CM | POA: Diagnosis present

## 2017-09-30 MED ORDER — SODIUM CHLORIDE 0.9 % IV SOLN
510.0000 mg | INTRAVENOUS | Status: DC
  Administered 2017-09-30: 510 mg via INTRAVENOUS
  Filled 2017-09-30: qty 17

## 2017-09-30 NOTE — Discharge Instructions (Signed)

## 2017-10-06 DIAGNOSIS — D485 Neoplasm of uncertain behavior of skin: Secondary | ICD-10-CM | POA: Diagnosis not present

## 2017-10-06 DIAGNOSIS — I10 Essential (primary) hypertension: Secondary | ICD-10-CM | POA: Diagnosis not present

## 2017-10-08 ENCOUNTER — Ambulatory Visit (HOSPITAL_COMMUNITY)
Admission: RE | Admit: 2017-10-08 | Discharge: 2017-10-08 | Disposition: A | Discharge status: Home / Self Care | Payer: Medicare HMO | Source: Ambulatory Visit | Attending: Nephrology | Admitting: Nephrology

## 2017-10-08 DIAGNOSIS — N189 Chronic kidney disease, unspecified: Secondary | ICD-10-CM | POA: Diagnosis not present

## 2017-10-08 DIAGNOSIS — D631 Anemia in chronic kidney disease: Secondary | ICD-10-CM | POA: Diagnosis not present

## 2017-10-08 MED ORDER — SODIUM CHLORIDE 0.9 % IV SOLN
510.0000 mg | INTRAVENOUS | Status: DC
  Administered 2017-10-08: 510 mg via INTRAVENOUS
  Filled 2017-10-08: qty 17

## 2017-11-06 DIAGNOSIS — H401122 Primary open-angle glaucoma, left eye, moderate stage: Secondary | ICD-10-CM | POA: Diagnosis not present

## 2017-11-06 DIAGNOSIS — H401113 Primary open-angle glaucoma, right eye, severe stage: Secondary | ICD-10-CM | POA: Diagnosis not present

## 2017-12-01 ENCOUNTER — Encounter (HOSPITAL_COMMUNITY): Payer: Self-pay | Admitting: Physician Assistant

## 2017-12-01 ENCOUNTER — Inpatient Hospital Stay (HOSPITAL_COMMUNITY)
Admission: EM | Admit: 2017-12-01 | Discharge: 2017-12-03 | DRG: 378 | Disposition: A | Discharge status: Home / Self Care | Payer: Medicare HMO | Attending: Family Medicine | Admitting: Family Medicine

## 2017-12-01 DIAGNOSIS — J449 Chronic obstructive pulmonary disease, unspecified: Secondary | ICD-10-CM | POA: Diagnosis present

## 2017-12-01 DIAGNOSIS — R531 Weakness: Secondary | ICD-10-CM | POA: Diagnosis not present

## 2017-12-01 DIAGNOSIS — R402413 Glasgow coma scale score 13-15, at hospital admission: Secondary | ICD-10-CM | POA: Diagnosis present

## 2017-12-01 DIAGNOSIS — K259 Gastric ulcer, unspecified as acute or chronic, without hemorrhage or perforation: Secondary | ICD-10-CM | POA: Diagnosis not present

## 2017-12-01 DIAGNOSIS — K920 Hematemesis: Secondary | ICD-10-CM | POA: Diagnosis not present

## 2017-12-01 DIAGNOSIS — I2510 Atherosclerotic heart disease of native coronary artery without angina pectoris: Secondary | ICD-10-CM | POA: Diagnosis present

## 2017-12-01 DIAGNOSIS — E861 Hypovolemia: Secondary | ICD-10-CM | POA: Diagnosis not present

## 2017-12-01 DIAGNOSIS — I252 Old myocardial infarction: Secondary | ICD-10-CM

## 2017-12-01 DIAGNOSIS — I272 Pulmonary hypertension, unspecified: Secondary | ICD-10-CM | POA: Diagnosis not present

## 2017-12-01 DIAGNOSIS — K922 Gastrointestinal hemorrhage, unspecified: Secondary | ICD-10-CM | POA: Diagnosis not present

## 2017-12-01 DIAGNOSIS — I340 Nonrheumatic mitral (valve) insufficiency: Secondary | ICD-10-CM | POA: Diagnosis present

## 2017-12-01 DIAGNOSIS — E1122 Type 2 diabetes mellitus with diabetic chronic kidney disease: Secondary | ICD-10-CM | POA: Diagnosis not present

## 2017-12-01 DIAGNOSIS — E039 Hypothyroidism, unspecified: Secondary | ICD-10-CM | POA: Diagnosis present

## 2017-12-01 DIAGNOSIS — I5042 Chronic combined systolic (congestive) and diastolic (congestive) heart failure: Secondary | ICD-10-CM | POA: Diagnosis not present

## 2017-12-01 DIAGNOSIS — K449 Diaphragmatic hernia without obstruction or gangrene: Secondary | ICD-10-CM | POA: Diagnosis present

## 2017-12-01 DIAGNOSIS — N184 Chronic kidney disease, stage 4 (severe): Secondary | ICD-10-CM | POA: Diagnosis not present

## 2017-12-01 DIAGNOSIS — K31819 Angiodysplasia of stomach and duodenum without bleeding: Secondary | ICD-10-CM | POA: Diagnosis not present

## 2017-12-01 DIAGNOSIS — Z7982 Long term (current) use of aspirin: Secondary | ICD-10-CM

## 2017-12-01 DIAGNOSIS — D62 Acute posthemorrhagic anemia: Secondary | ICD-10-CM | POA: Diagnosis present

## 2017-12-01 DIAGNOSIS — E785 Hyperlipidemia, unspecified: Secondary | ICD-10-CM | POA: Diagnosis present

## 2017-12-01 DIAGNOSIS — T471X6A Underdosing of other antacids and anti-gastric-secretion drugs, initial encounter: Secondary | ICD-10-CM | POA: Diagnosis present

## 2017-12-01 DIAGNOSIS — Z87891 Personal history of nicotine dependence: Secondary | ICD-10-CM | POA: Diagnosis not present

## 2017-12-01 DIAGNOSIS — I130 Hypertensive heart and chronic kidney disease with heart failure and stage 1 through stage 4 chronic kidney disease, or unspecified chronic kidney disease: Secondary | ICD-10-CM | POA: Diagnosis present

## 2017-12-01 DIAGNOSIS — Z79899 Other long term (current) drug therapy: Secondary | ICD-10-CM

## 2017-12-01 DIAGNOSIS — Z7984 Long term (current) use of oral hypoglycemic drugs: Secondary | ICD-10-CM

## 2017-12-01 DIAGNOSIS — D631 Anemia in chronic kidney disease: Secondary | ICD-10-CM | POA: Diagnosis present

## 2017-12-01 DIAGNOSIS — R404 Transient alteration of awareness: Secondary | ICD-10-CM | POA: Diagnosis not present

## 2017-12-01 DIAGNOSIS — K921 Melena: Secondary | ICD-10-CM | POA: Diagnosis present

## 2017-12-01 DIAGNOSIS — E119 Type 2 diabetes mellitus without complications: Secondary | ICD-10-CM

## 2017-12-01 DIAGNOSIS — K250 Acute gastric ulcer with hemorrhage: Secondary | ICD-10-CM | POA: Diagnosis not present

## 2017-12-01 DIAGNOSIS — I110 Hypertensive heart disease with heart failure: Secondary | ICD-10-CM | POA: Diagnosis not present

## 2017-12-01 DIAGNOSIS — I959 Hypotension, unspecified: Secondary | ICD-10-CM | POA: Diagnosis present

## 2017-12-01 LAB — COMPREHENSIVE METABOLIC PANEL
ALBUMIN: 3.1 g/dL — AB (ref 3.5–5.0)
ALK PHOS: 33 U/L — AB (ref 38–126)
ALT: 12 U/L — AB (ref 14–54)
AST: 20 U/L (ref 15–41)
Anion gap: 13 (ref 5–15)
BILIRUBIN TOTAL: 0.6 mg/dL (ref 0.3–1.2)
BUN: 121 mg/dL — AB (ref 6–20)
CALCIUM: 10.3 mg/dL (ref 8.9–10.3)
CO2: 21 mmol/L — ABNORMAL LOW (ref 22–32)
Chloride: 108 mmol/L (ref 101–111)
Creatinine, Ser: 3.65 mg/dL — ABNORMAL HIGH (ref 0.44–1.00)
GFR calc Af Amer: 12 mL/min — ABNORMAL LOW (ref >60)
GFR calc non Af Amer: 10 mL/min — ABNORMAL LOW (ref >60)
GLUCOSE: 168 mg/dL — AB (ref 65–99)
Potassium: 4.2 mmol/L (ref 3.5–5.1)
Sodium: 142 mmol/L (ref 135–145)
TOTAL PROTEIN: 5.9 g/dL — AB (ref 6.5–8.1)

## 2017-12-01 LAB — URINALYSIS, ROUTINE W REFLEX MICROSCOPIC
BILIRUBIN URINE: NEGATIVE
Glucose, UA: NEGATIVE mg/dL
KETONES UR: NEGATIVE mg/dL
Nitrite: NEGATIVE
PH: 5 (ref 5.0–8.0)
Protein, ur: 30 mg/dL — AB
SPECIFIC GRAVITY, URINE: 1.013 (ref 1.005–1.030)

## 2017-12-01 LAB — POC OCCULT BLOOD, ED: FECAL OCCULT BLD: POSITIVE — AB

## 2017-12-01 LAB — I-STAT CHEM 8, ED
BUN: 103 mg/dL — ABNORMAL HIGH (ref 6–20)
Calcium, Ion: 1.4 mmol/L (ref 1.15–1.40)
Chloride: 107 mmol/L (ref 101–111)
Creatinine, Ser: 3.7 mg/dL — ABNORMAL HIGH (ref 0.44–1.00)
Glucose, Bld: 157 mg/dL — ABNORMAL HIGH (ref 65–99)
HEMATOCRIT: 20 % — AB (ref 36.0–46.0)
HEMOGLOBIN: 6.8 g/dL — AB (ref 12.0–15.0)
POTASSIUM: 4.1 mmol/L (ref 3.5–5.1)
Sodium: 142 mmol/L (ref 135–145)
TCO2: 22 mmol/L (ref 22–32)

## 2017-12-01 LAB — CBC
HEMATOCRIT: 22.2 % — AB (ref 36.0–46.0)
Hemoglobin: 6.9 g/dL — CL (ref 12.0–15.0)
MCH: 27.4 pg (ref 26.0–34.0)
MCHC: 31.1 g/dL (ref 30.0–36.0)
MCV: 88.1 fL (ref 78.0–100.0)
Platelets: 155 10*3/uL (ref 150–400)
RBC: 2.52 MIL/uL — ABNORMAL LOW (ref 3.87–5.11)
RDW: 14.7 % (ref 11.5–15.5)
WBC: 6.5 10*3/uL (ref 4.0–10.5)

## 2017-12-01 LAB — GLUCOSE, CAPILLARY: GLUCOSE-CAPILLARY: 94 mg/dL (ref 65–99)

## 2017-12-01 LAB — CBG MONITORING, ED: Glucose-Capillary: 107 mg/dL — ABNORMAL HIGH (ref 65–99)

## 2017-12-01 LAB — APTT: APTT: 24 s (ref 24–36)

## 2017-12-01 LAB — PROTIME-INR
INR: 1.17
PROTHROMBIN TIME: 14.8 s (ref 11.4–15.2)

## 2017-12-01 LAB — MRSA PCR SCREENING: MRSA by PCR: NEGATIVE

## 2017-12-01 LAB — PREPARE RBC (CROSSMATCH)

## 2017-12-01 MED ORDER — ONDANSETRON HCL 4 MG PO TABS: 4.0000 mg | ORAL_TABLET | Freq: Four times a day (QID) | ORAL | Status: DC | PRN

## 2017-12-01 MED ORDER — HYDROCODONE-ACETAMINOPHEN 5-325 MG PO TABS: 1.0000 | ORAL_TABLET | ORAL | Status: DC | PRN

## 2017-12-01 MED ORDER — INSULIN ASPART 100 UNIT/ML ~~LOC~~ SOLN
0.0000 [IU] | Freq: Three times a day (TID) | SUBCUTANEOUS | Status: DC
  Administered 2017-12-02: 2 [IU] via SUBCUTANEOUS

## 2017-12-01 MED ORDER — ACETAMINOPHEN 650 MG RE SUPP: 650.0000 mg | Freq: Four times a day (QID) | RECTAL | Status: DC | PRN

## 2017-12-01 MED ORDER — ONDANSETRON HCL 4 MG/2ML IJ SOLN: 4.0000 mg | Freq: Four times a day (QID) | INTRAMUSCULAR | Status: DC | PRN

## 2017-12-01 MED ORDER — SODIUM CHLORIDE 0.9 % IV SOLN
Freq: Once | INTRAVENOUS | Status: AC
  Administered 2017-12-02: 11:00:00 via INTRAVENOUS

## 2017-12-01 MED ORDER — LEVOTHYROXINE SODIUM 50 MCG PO TABS
50.0000 ug | ORAL_TABLET | Freq: Every day | ORAL | Status: DC
  Administered 2017-12-02 – 2017-12-03 (×2): 50 ug via ORAL
  Filled 2017-12-01 (×2): qty 1

## 2017-12-01 MED ORDER — SODIUM CHLORIDE 0.9 % IV SOLN
Freq: Once | INTRAVENOUS | Status: AC
  Administered 2017-12-01: 14:00:00 via INTRAVENOUS

## 2017-12-01 MED ORDER — ACETAMINOPHEN 325 MG PO TABS: 650.0000 mg | ORAL_TABLET | Freq: Four times a day (QID) | ORAL | Status: DC | PRN

## 2017-12-01 MED ORDER — PANTOPRAZOLE SODIUM 40 MG IV SOLR
40.0000 mg | Freq: Two times a day (BID) | INTRAVENOUS | Status: DC
  Administered 2017-12-01 – 2017-12-02 (×2): 40 mg via INTRAVENOUS
  Filled 2017-12-01 (×2): qty 40

## 2017-12-01 MED ORDER — BISACODYL 10 MG RE SUPP: 10.0000 mg | Freq: Every day | RECTAL | Status: DC | PRN

## 2017-12-01 MED ORDER — PANTOPRAZOLE SODIUM 40 MG IV SOLR
40.0000 mg | Freq: Once | INTRAVENOUS | Status: AC
  Administered 2017-12-01: 40 mg via INTRAVENOUS
  Filled 2017-12-01: qty 40

## 2017-12-01 MED ORDER — SENNOSIDES-DOCUSATE SODIUM 8.6-50 MG PO TABS: 1.0000 | ORAL_TABLET | Freq: Every evening | ORAL | Status: DC | PRN

## 2017-12-01 MED ORDER — LATANOPROST 0.005 % OP SOLN
1.0000 [drp] | Freq: Every day | OPHTHALMIC | Status: DC
  Administered 2017-12-02 (×2): 1 [drp] via OPHTHALMIC
  Filled 2017-12-01 (×4): qty 2.5

## 2017-12-01 MED ORDER — SODIUM CHLORIDE 0.9 % IV SOLN
INTRAVENOUS | Status: DC
  Administered 2017-12-01: 21:00:00 via INTRAVENOUS
  Administered 2017-12-02: 1000 mL via INTRAVENOUS

## 2017-12-01 NOTE — ED Provider Notes (Signed)
[facility] EMERGENCY DEPARTMENT Provider Note   CSN: [id number] Arrival date & time: 12/01/17  1122     History   Chief Complaint Chief Complaint  Patient presents with  . Fatigue  . Rectal Bleeding    HPI Mandy Larson is a 84 y.o. female.  HPI 84 year old female past medical history significant for CAD, GI bleed, CKD, diabetes, CHF last EF in 2018 was 55%, hyperlipidemia, severe mitral regurg, pulmonary hypertension, hypertension that presents to the emergency department today for evaluation of weakness and melena with hematemesis.  Patient with history of GI bleed and 07/2017 with a hemoglobin of 4.8 at that time that required 4 units of RBCs on admission to the hospital.  Patient was seen by GI at that time who had an endoscopy performed that showed some esophageal strictures and pyloric ulceration.  Patient states that since yesterday she has been having generalized weakness.  She also reports several episodes of black tarry stools yesterday an episode of black emesis this morning.  She reports some dizziness with ambulation today.  Denies any associated chest pain or shortness of breath.  Denies any associated abdominal pain.  Patient has had no syncopal episodes.  Denies any blood thinner use. She has not been taking anything for her symptoms prior to arrival.  Denies any chronic NSAID use.  Denies any chronic alcohol use.  Patient did receive 500 fluid bolus prior to arrival to the ED.  Pt denies any fever, chill, ha, vision changes, lightheadedness, dizziness, congestion, neck pain, cp, sob, cough, abd pain, n/v/d, urinary symptoms, change in bowel habits,  lower extremity paresthesias.  Past Medical History:  Diagnosis Date  . Anemia   . CAD (coronary artery disease) 06/26/2014   admx with NSTEMI in 12/13 >> tx medically due to CKD  . Chronic combined systolic and diastolic CHF (congestive heart failure) ([place]) 11/08/2012   Echo 12/13:  EF 35-40, inferior and  inferoseptal akinesis, grade 2 diastolic dysfunction, MAC, dysfunction/tethering of the posterior papillary muscle as a result of inferior infarct, severe MR, moderate PI, PASP 50, small circumferential pericardial effusion without hemodynamic compromise  . CKD (chronic kidney disease)   . Diabetes mellitus without complication ([place])   . Former smoker   . Hyperlipidemia   . Hypertension   . Pulmonary hypertension ([place]) 11/08/2012  . Severe mitral regurgitation 11/08/2012   Due to papillary muscle dysfunction from prior inf infarct    Patient Active Problem List   Diagnosis Date Noted  . Acute gastric ulcer with hemorrhage   . Acute blood loss anemia 07/24/2017  . Melena 07/24/2017  . Hypotension 07/24/2017  . Syncope 07/24/2017  . CAD (coronary artery disease) 06/26/2014  . Chronic combined systolic and diastolic CHF (congestive heart failure) ([place]) 11/08/2012  . Pulmonary hypertension ([place]) 11/08/2012  . Severe mitral regurgitation 11/08/2012  . Acute pericardial effusion-small 11/08/2012  . Pulmonary edema 11/06/2012  . Chest pain 11/06/2012  . Hypertensive heart and chronic kidney disease with heart failure and stage 1 through stage 4 chronic kidney disease, or chronic kidney disease ([place]) 11/06/2012  . DM2 (diabetes mellitus, type 2) ([place]) 11/06/2012  . CKD (chronic kidney disease) stage 4, GFR 15-29 ml/min ([facility]) 11/06/2012    Past Surgical History:  Procedure Laterality Date  . ESOPHAGOGASTRODUODENOSCOPY N/A 07/25/2017   Procedure: ESOPHAGOGASTRODUODENOSCOPY (EGD);  Surgeon: [name], MD;  Location: [facility];  Service: Gastroenterology;  Laterality: N/A;  . EYE SURGERY      OB History  No data available       Home Medications    Prior to Admission medications   Medication Sig Start Date End Date Taking? Authorizing Provider  amLODipine (NORVASC) 5 MG tablet Take 5 mg by mouth daily. 04/29/17   [provider]  aspirin EC 81 MG tablet Take 1  tablet (81 mg total) by mouth daily. 07/31/17   Hosie Poisson, MD  Calcium Carb-Cholecalciferol (CALTRATE 600+D) 600-800 MG-UNIT TABS Take 1 tablet by mouth daily.    [provider]  carvedilol (COREG) 3.125 MG tablet Take 1 tablet (3.125 mg total) by mouth 2 (two) times daily with a meal. 10/23/16   Nahser, Wonda Cheng, MD  dorzolamide-timolol (COSOPT) 22.3-6.8 MG/ML ophthalmic solution Place 1 drop into both eyes 2 (two) times daily.  06/20/17   [provider]  feeding supplement, GLUCERNA SHAKE, (GLUCERNA SHAKE) LIQD Take 237 mLs by mouth 2 (two) times daily between meals. 07/26/17   Hosie Poisson, MD  furosemide (LASIX) 20 MG tablet Take 1 tablet (20 mg total) by mouth daily. 12/14/12   Nahser, Wonda Cheng, MD  glipiZIDE (GLUCOTROL) 5 MG tablet Take 2.5 mg by mouth daily before breakfast. TAKE 1/2 TABLET BY MOUTH ONE TIME DAILY    [provider]  isosorbide mononitrate (IMDUR) 30 MG 24 hr tablet Take 1 tablet (30 mg total) by mouth daily. 02/28/13   Nahser, Wonda Cheng, MD  latanoprost (XALATAN) 0.005 % ophthalmic solution Place 1 drop into both eyes at bedtime.  06/15/17   [provider]  levothyroxine (SYNTHROID, LEVOTHROID) 50 MCG tablet Take 50 mcg by mouth daily.    [provider]  Multiple Vitamins-Minerals (CENTRUM SILVER PO) Take 1 tablet by mouth daily. Dose Unknown    [provider]  pantoprazole (PROTONIX) 40 MG tablet Take 1 tablet (40 mg total) by mouth 2 (two) times daily. 07/27/17 08/26/17  Hosie Poisson, MD  pantoprazole (PROTONIX) 40 MG tablet Take 1 tablet (40 mg total) by mouth daily. 08/27/17   Hosie Poisson, MD  simvastatin (ZOCOR) 20 MG tablet Take 20 mg by mouth every evening.    [provider]    Family History Family History  Problem Relation Age of Onset  . Hypertension Father     Social History Social History   Tobacco Use  . Smoking status: Former Smoker    Types: Cigarettes    Last attempt to quit:  11/09/1999    Years since quitting: 18.0  . Smokeless tobacco: Never Used  Substance Use Topics  . Alcohol use: No    Alcohol/week: 0.0 oz  . Drug use: No     Allergies   Patient has no known allergies.   Review of Systems Review of Systems  Constitutional: Negative for chills and fever.  HENT: Negative for congestion.   Eyes: Negative for visual disturbance.  Respiratory: Negative for cough and shortness of breath.   Cardiovascular: Negative for chest pain.  Gastrointestinal: Positive for blood in stool, diarrhea, nausea and vomiting. Negative for abdominal pain.  Genitourinary: Negative for dysuria, flank pain, frequency, hematuria and urgency.  Musculoskeletal: Negative for arthralgias and myalgias.  Skin: Negative for rash.  Neurological: Negative for dizziness, syncope, weakness, light-headedness, numbness and headaches.  Psychiatric/Behavioral: Negative for sleep disturbance. The patient is not nervous/anxious.      Physical Exam Updated Vital Signs BP (!) 124/54   Pulse 61   Temp 97.8 F (36.6 C) (Oral)   Resp 14   Ht _0  (1.6 m)  Wt 67.1 kg (148 lb)   SpO2 100%   BMI 26.22 kg/m   Physical Exam  Constitutional: She is oriented to person, place, and time. She appears well-developed and well-nourished.  Non-toxic appearance. No distress.  HENT:  Head: Normocephalic and atraumatic.  Nose: Nose normal.  Mouth/Throat: Oropharynx is clear and moist.  Eyes: Conjunctivae are normal. Pupils are equal, round, and reactive to light. Right eye exhibits no discharge. Left eye exhibits no discharge.  Neck: Normal range of motion. Neck supple.  Cardiovascular: Normal rate, regular rhythm, normal heart sounds and intact distal pulses. Exam reveals no gallop and no friction rub.  No murmur heard. Pulmonary/Chest: Effort normal and breath sounds normal. No stridor. No respiratory distress. She has no wheezes. She has no rales. She exhibits no tenderness.  Abdominal:  Soft. Bowel sounds are normal. There is no tenderness. There is no rigidity, no rebound, no guarding, no CVA tenderness, no tenderness at McBurney's point and negative Murphy's sign.  Genitourinary:  Genitourinary Comments: Chaperone present for exam. Pt tolerated without difficulty. No external hemorrhoids or fissures noted. Gross melena noted.  Hemoccult positive   Musculoskeletal: Normal range of motion. She exhibits no tenderness.  Lymphadenopathy:    She has no cervical adenopathy.  Neurological: She is alert and oriented to person, place, and time.  Follow commands appropriately  Skin: Skin is warm and dry. Capillary refill takes less than 2 seconds. There is pallor.  Psychiatric: Her behavior is normal. Judgment and thought content normal.  Nursing note and vitals reviewed.    ED Treatments / Results  Labs (all labs ordered are listed, but only abnormal results are displayed) Labs Reviewed  COMPREHENSIVE METABOLIC PANEL - Abnormal; Notable for the following components:      Result Value   CO2 21 (*)    Glucose, Bld 168 (*)    BUN 121 (*)    Creatinine, Ser 3.65 (*)    Total Protein 5.9 (*)    Albumin 3.1 (*)    ALT 12 (*)    Alkaline Phosphatase 33 (*)    GFR calc non Af Amer 10 (*)    GFR calc Af Amer 12 (*)    All other components within normal limits  CBC - Abnormal; Notable for the following components:   RBC 2.52 (*)    Hemoglobin 6.9 (*)    HCT 22.2 (*)    All other components within normal limits  POC OCCULT BLOOD, ED - Abnormal; Notable for the following components:   Fecal Occult Bld POSITIVE (*)    All other components within normal limits  I-STAT CHEM 8, ED - Abnormal; Notable for the following components:   BUN 103 (*)    Creatinine, Ser 3.70 (*)    Glucose, Bld 157 (*)    Hemoglobin 6.8 (*)    HCT 20.0 (*)    All other components within normal limits  PROTIME-INR  APTT  TYPE AND SCREEN  PREPARE RBC (CROSSMATCH)    EKG  EKG  Interpretation  Date/Time:  Tuesday December 01 2017 11:47:11 EST Ventricular Rate:  59 PR Interval:    QRS Duration: 188 QT Interval:  532 QTC Calculation: 528 R Axis:   -88 Text Interpretation:  Sinus rhythm RBBB and LAFB Probable left ventricular hypertrophy No significant change since last tracing Confirmed by Isla Pence [phone]) on 12/01/2017 1:12:29 PM       Radiology No results found.  Procedures .Critical Care Performed by: Doristine Devoid, PA-C Authorized by:  Doristine Devoid, PA-C   Critical care provider statement:    Critical care time (minutes):  50   Critical care was necessary to treat or prevent imminent or life-threatening deterioration of the following conditions: GI bleed requiring blood transfusion, anemia.   Critical care was time spent personally by me on the following activities:  Development of treatment plan with patient or surrogate, discussions with consultants, discussions with primary provider, examination of patient, evaluation of patient's response to treatment, obtaining history from patient or surrogate, ordering and performing treatments and interventions, ordering and review of laboratory studies, ordering and review of radiographic studies, pulse oximetry, re-evaluation of patient's condition and review of old charts   (including critical care time)  Medications Ordered in ED Medications  pantoprazole (PROTONIX) injection 40 mg (not administered)  0.9 %  sodium chloride infusion (not administered)     Initial Impression / Assessment and Plan / ED Course  I have reviewed the triage vital signs and the nursing notes.  Pertinent labs & imaging results that were available during my care of the patient were reviewed by me and considered in my medical decision making (see chart for details).     Patient presents to the ED with complaints of generalized weakness with associated melena and hematemesis.  Patient with history of GI bleed  approximately 5 months ago was admitted to the hospital and received blood transfusion.  She was seen by GI who performed endoscopy without any focal signs of bleeding.  On exam patient is pale.  Heart  is regular rate and rhythm.  Patient's blood pressures are soft with systolic in the low 161W.  Patient is afebrile.  She is mentating well.  Follow commands appropriately.  Abdominal exam is benign.  No focal tenderness.  Bowel sounds are normal.  Lungs clear to auscultation bilaterally.  Patient does have gross melena noted.  She is not actively vomiting.  Blood work reveals a hemoglobin of 6.8 baseline appears to be 9-10.  Her creatinine is 3.6 which is at patient's baseline.  Electrolytes are reassuring.  Type and screen was ordered.  Patient Hemoccult was positive.  Blood pressures have been improved without any intervention.  EKG shows no ischemic changes.  Protonix ordered in the ED.  I spoke with PA for [facility] who asked that I add on coags for patient.  They will see patient in consultation recommend hospital admission.  Patient remains hemodynamically stable at this time.  Again blood pressures are improving.  Patient had 2 units of blood that was ordered.  Will consult hospital medicine.  I spoke with the APP Sherrilyn Rist with hospital medicine who agrees to admission will see patient in the ED and place admission orders.  Has recommended repeat patient n.p.o.  Patient remains hemodynamically stable this time  Patient seen and evaluated my attending who is agreed with the above plan..   Final Clinical Impressions(s) / ED Diagnoses   Final diagnoses:  Gastrointestinal hemorrhage, unspecified gastrointestinal hemorrhage type    ED Discharge Orders    None       Aaron Edelman 12/01/17 1347    Isla Pence, MD 12/01/17 1410

## 2017-12-01 NOTE — ED Triage Notes (Signed)
Per EMS pt stated that she felt weak yesterday and woke up this morning spitting up blood.  States being dizzy when ambulating.  Denies SOB and CP.  AOx4 NAD noted at this time.

## 2017-12-01 NOTE — ED Notes (Signed)
Hospitalists at bedside.  

## 2017-12-01 NOTE — H&P (Signed)
History and Physical    Mandy Larson KJZ:[id number] DOB: 1930/05/22 DOA: 12/01/2017   PCP: [name], MD   Patient coming from:  Home    Chief Complaint: Fatigue and dark stookls   HPI: Mandy Larson is a 84 y.o. female with medical history significant for CAD, chronic combined systolic and diastolic heart failure, severe mitral regurgitation, hypertension, pulmonary hypertension, COPD, hypothyroidism, diabetes, CKD stage IV, recent hospitalization for GI bleed in September 2018, with unremarkable endoscopy work, presenting to the ED with acute onset of fatigue, generalized weakness, shortness of breath with exertion, as well as melena, and black vomit.  She denies any epigastric pain.  She denies any dysuria, gross hematuria, or lower extremity edema.  No confusion is reported.  She denies any headache but she does report some dizziness, without vertigo, syncope or presyncope.  She denies any gum bleed, hemoptysis, epistaxis.  Eyes any other hospitalizations since her discharge last year, and she denies any sick contacts.  She denies using NSAIDs or aspirin.  As mentioned above, her last EGD was performed 07/25/2017 by Dr. Henrene Pastor, which showed a clean-based pyloric ulcer, Clostridium negative, and incidental large caliber distal esophageal stricture.   ED Course:  BP 126/65   Pulse 69   Temp 97.9 F (36.6 C) (Oral)   Resp (!) 22   Ht _0  (1.6 m)   Wt 67.1 kg (148 lb)   SpO2 100%   BMI 26.22 kg/m   Hemoccult was positive Was 6.8.  Of note, during her prior visualization was about 4.9 receiving 4 units of blood.  Of note, she has anemia documented at least since 2013, at which time was as low as 7.2 then.  Also, the patient has been receiving Feraheme infusion, last on 09/30/2017 and 10/08/2017 as per [facility]. Platelets 150 BUN and creatinine 121 and 3.6 respectively. She received 40 mg IV of Protonix. Her blood pressure was quite low, 81/60 on presentation, but then she  received 1 L of IV fluids, and she received 500 cc bolus of normal saline, improving her blood pressure to the 120s over 60s.  Pulse is normal in the 60s, and she has normal O2 sats in room air.  Review of Systems:  As per HPI otherwise all other systems reviewed and are negative  Past Medical History:  Diagnosis Date  . Anemia   . CAD (coronary artery disease) 06/26/2014   admx with NSTEMI in 12/13 >> tx medically due to CKD  . Chronic combined systolic and diastolic CHF (congestive heart failure) ([place]) 11/08/2012   Echo 12/13:  EF 35-40, inferior and inferoseptal akinesis, grade 2 diastolic dysfunction, MAC, dysfunction/tethering of the posterior papillary muscle as a result of inferior infarct, severe MR, moderate PI, PASP 50, small circumferential pericardial effusion without hemodynamic compromise  . CKD (chronic kidney disease)   . Diabetes mellitus without complication ([place])   . Former smoker   . Hyperlipidemia   . Hypertension   . Pulmonary hypertension ([place]) 11/08/2012  . Severe mitral regurgitation 11/08/2012   Due to papillary muscle dysfunction from prior inf infarct    Past Surgical History:  Procedure Laterality Date  . ESOPHAGOGASTRODUODENOSCOPY N/A 07/25/2017   Procedure: ESOPHAGOGASTRODUODENOSCOPY (EGD);  Surgeon: [name], MD;  Location: [facility];  Service: Gastroenterology;  Laterality: N/A;  . EYE SURGERY      Social History Social History   Socioeconomic History  . Marital status: Widowed    Spouse name: Not on file  . Number of  children: Not on file  . Years of education: Not on file  . Highest education level: Not on file  Social Needs  . Financial resource strain: Not on file  . Food insecurity - worry: Not on file  . Food insecurity - inability: Not on file  . Transportation needs - medical: Not on file  . Transportation needs - non-medical: Not on file  Occupational History  . Not on file  Tobacco Use  . Smoking status: Former Smoker     Types: Cigarettes    Last attempt to quit: 11/09/1999    Years since quitting: 18.0  . Smokeless tobacco: Never Used  Substance and Sexual Activity  . Alcohol use: No    Alcohol/week: 0.0 oz  . Drug use: No  . Sexual activity: No  Other Topics Concern  . Not on file  Social History Narrative  . Not on file     No Known Allergies  Family History  Problem Relation Age of Onset  . Hypertension Father       Prior to Admission medications   Medication Sig Start Date End Date Taking? Authorizing Provider  amLODipine (NORVASC) 5 MG tablet Take 5 mg by mouth daily. 04/29/17   [provider]  aspirin EC 81 MG tablet Take 1 tablet (81 mg total) by mouth daily. 07/31/17   Hosie Poisson, MD  Calcium Carb-Cholecalciferol (CALTRATE 600+D) 600-800 MG-UNIT TABS Take 1 tablet by mouth daily.    [provider]  carvedilol (COREG) 3.125 MG tablet Take 1 tablet (3.125 mg total) by mouth 2 (two) times daily with a meal. 10/23/16   Nahser, Wonda Cheng, MD  dorzolamide-timolol (COSOPT) 22.3-6.8 MG/ML ophthalmic solution Place 1 drop into both eyes 2 (two) times daily.  06/20/17   [provider]  feeding supplement, GLUCERNA SHAKE, (GLUCERNA SHAKE) LIQD Take 237 mLs by mouth 2 (two) times daily between meals. 07/26/17   Hosie Poisson, MD  furosemide (LASIX) 20 MG tablet Take 1 tablet (20 mg total) by mouth daily. 12/14/12   Nahser, Wonda Cheng, MD  glipiZIDE (GLUCOTROL) 5 MG tablet Take 2.5 mg by mouth daily before breakfast. TAKE 1/2 TABLET BY MOUTH ONE TIME DAILY    [provider]  isosorbide mononitrate (IMDUR) 30 MG 24 hr tablet Take 1 tablet (30 mg total) by mouth daily. 02/28/13   Nahser, Wonda Cheng, MD  latanoprost (XALATAN) 0.005 % ophthalmic solution Place 1 drop into both eyes at bedtime.  06/15/17   [provider]  levothyroxine (SYNTHROID, LEVOTHROID) 50 MCG tablet Take 50 mcg by mouth daily.    [provider]  Multiple Vitamins-Minerals (CENTRUM  SILVER PO) Take 1 tablet by mouth daily. Dose Unknown    [provider]  pantoprazole (PROTONIX) 40 MG tablet Take 1 tablet (40 mg total) by mouth 2 (two) times daily. 07/27/17 08/26/17  Hosie Poisson, MD  pantoprazole (PROTONIX) 40 MG tablet Take 1 tablet (40 mg total) by mouth daily. 08/27/17   Hosie Poisson, MD  simvastatin (ZOCOR) 20 MG tablet Take 20 mg by mouth every evening.    [provider]    Physical Exam:  Vitals:   12/01/17 1141 12/01/17 1300 12/01/17 1351 12/01/17 1408  BP: (!) 102/56 (!) 124/54 133/64 126/65  Pulse: 62 61 65 69  Resp: (!) 25 14 (!) 23 (!) 22  Temp: 97.8 F (36.6 C)  (!) 97.5 F (36.4 C) 97.9 F (36.6 C)  TempSrc: Oral  Oral Oral  SpO2: 97% 100% 99%  100%  Weight:      Height:       Constitutional: NAD, calm, comfortable, edentulous.   Eyes: PERRL, lids and conjunctivae is pale. ENMT: Mucous membranes are moist, without exudate or lesions  Neck: normal, supple, no masses, no thyromegaly Respiratory: clear to auscultation bilaterally, no wheezing, no crackles. Normal respiratory effort  Cardiovascular: Regular rate and rhythm, 2/6 murmur, rubs or gallops. No extremity edema. 2+ pedal pulses. No carotid bruits.  Abdomen: Soft, non tender, No hepatosplenomegaly. Bowel sounds positive.  Musculoskeletal: no clubbing / cyanosis. Moves all extremities Skin: no jaundice, No lesions. Very pale Neurologic: Sensation intact  Strength equal in all extremities Psychiatric:   Alert and oriented x 3. Normal mood.     Labs on Admission: I have personally reviewed following labs and imaging studies  CBC: Recent Labs  Lab 12/01/17 1154 12/01/17 1211  WBC 6.5  --   HGB 6.9* 6.8*  HCT 22.2* 20.0*  MCV 88.1  --   PLT 155  --     Basic Metabolic Panel: Recent Labs  Lab 12/01/17 1154 12/01/17 1211  NA 142 142  K 4.2 4.1  CL 108 107  CO2 21*  --   GLUCOSE 168* 157*  BUN 121* 103*  CREATININE 3.65* 3.70*  CALCIUM 10.3  --      GFR: Estimated Creatinine Clearance: 10.2 mL/min (A) (by C-G formula based on SCr of 3.7 mg/dL (H)).  Liver Function Tests: Recent Labs  Lab 12/01/17 1154  AST 20  ALT 12*  ALKPHOS 33*  BILITOT 0.6  PROT 5.9*  ALBUMIN 3.1*   No results for input(s): LIPASE, AMYLASE in the last 168 hours. No results for input(s): AMMONIA in the last 168 hours.  Coagulation Profile: Recent Labs  Lab 12/01/17 1319  INR 1.17    Cardiac Enzymes: No results for input(s): CKTOTAL, CKMB, CKMBINDEX, TROPONINI in the last 168 hours.  BNP (last 3 results) No results for input(s): PROBNP in the last 8760 hours.  HbA1C: No results for input(s): HGBA1C in the last 72 hours.  CBG: No results for input(s): GLUCAP in the last 168 hours.  Lipid Profile: No results for input(s): CHOL, HDL, LDLCALC, TRIG, CHOLHDL, LDLDIRECT in the last 72 hours.  Thyroid Function Tests: No results for input(s): TSH, T4TOTAL, FREET4, T3FREE, THYROIDAB in the last 72 hours.  Anemia Panel: No results for input(s): VITAMINB12, FOLATE, FERRITIN, TIBC, IRON, RETICCTPCT in the last 72 hours.  Urine analysis: No results found for: COLORURINE, APPEARANCEUR, LABSPEC, PHURINE, GLUCOSEU, HGBUR, BILIRUBINUR, KETONESUR, PROTEINUR, UROBILINOGEN, NITRITE, LEUKOCYTESUR  Sepsis Labs: _0 (procalcitonin:4,lacticidven:4) )No results found for this or any previous visit (from the past 240 hour(s)).   Radiological Exams on Admission: No results found.  EKG: Independently reviewed.  Assessment/Plan Principal Problem:   Acute blood loss anemia Active Problems:   Melena   DM2 (diabetes mellitus, type 2) (HCC)   CKD (chronic kidney disease) stage 4, GFR 15-29 ml/min (HCC)   Chronic combined systolic and diastolic CHF (congestive heart failure) (HCC)   Pulmonary hypertension (HCC)   Severe mitral regurgitation   CAD (coronary artery disease)   Hypotension   GIB (gastrointestinal bleeding)   Gastro Intestinal Bleed  likely higher, in a patient with story of same, currently with symptomatic anemia, status post EGD September 2018.  Hemoglobin is 6.9.  GI evaluated the patient, recommending twice daily PPI therapy.  Patient will remain n.p.o after midnight, as EGD is planned for tomorrow at 11 AM.  Due to the risk  of continuing bleeding, the patient will be admitted to stepdown telemetry.  IV protonix bid IVF after transfusion Hold BP meds,  Liquid diet, n.p.o. after midnight Appreciate GI consultation Hold NSAIDs Serial CBCs SCDs  Hyportension in a patient with a history of Hypertension BP 126/65   Pulse 69 . Afebrile. Likely due to hypovolemia, WBC unremarkable   Upon presentation, her systolic blood pressure was in the 80s, likely related to #1.  She was provided with IV fluid bolus of 500 cc, and her blood pressure responded nicely. Continue gentle IV fluids after her transfusion Hold her blood pressure medications which include Norvasc, Coreg, Lasix, Imdur and lisinopril. Monitor closely and resume as indicated   Chronic kidney disease stage 4, current creatinine is 3.7, baseline is between 3.3 and 3.5. Lab Results  Component Value Date   CREATININE 3.70 (H) 12/01/2017   CREATININE 3.65 (H) 12/01/2017   CREATININE 3.34 (H) 08/06/2017  IVF Hold diuretics and ACEI Repeat BMET in am Hold NSAIDS Monitor output and input    Chronic combined systolic and diastolic heart failure, despite her acute issues, appears compensated.  Her weight is 148 pounds.  Last 2D echo EF 45-03%, normal systolic, grade 1 diastolic  Daily weights Intake and output Medications as above  Type II Diabetes Current blood sugar level is 157 Lab Results  Component Value Date   HGBA1C 5.7 (H) 07/24/2017  SSI Hold oral agents  Hypothyroidism: Continue home Synthroid     DVT prophylaxis: SCDs Code Status:    Limited Family Communication:  Discussed with patient Disposition Plan: Expect patient to be discharged  to home after condition improves Consults called:    GI, Azucena Freed Admission status: SDU tele   Sharene Butters, PA-C [facility]   Amion text  [phone]   12/01/2017, 2:39 PM

## 2017-12-01 NOTE — Consult Note (Signed)
[facility] Consult: 1:03 PM 12/01/2017  LOS: 0 days    Referring Provider: PA  Nelson Chimes in ED.   Primary Care Physician:  [name], MD Primary Gastroenterologist:  Dr. Loletha Carrow (previously [facility] and [facility] Iles)     Reason for Consultation:  GI bleed   HPI: Mandy Larson is a 84 y.o. female.  PMH Stage 4 CKD.  DM 2, on oral agents.  CAD and non-STEMI 2013: no Cath due to CKD.  EF 55 to 60 %, grade 1 Diastolic dysfx.   07/2017.  Pulmonary htn.  COPD.  HTN.  Cholelithiasis and medical renal dz per ultrasound 2014.  Hypothyroidism.  Glaucoma. S/P bilateral cataract surgery.  Anemia dates back to 2013, with Hgbs as low as 7.2 in 2013.   Adenomatous colon polyps removed in 1993, 1999, 2002.   06/2005 Colonoscopy.  Hyperplastic sigmoid polyp and left sided diverticulosis. GIB with Hgb 4.6, black/FOBT + stools in 07/2017.  Transfused total 4 PRBCs: Hgb 4.6 >> 10.6 at discharge.  .  07/25/2017 EGD: Dr Henrene Pastor.  Clean-based pyloric ulcer, Clo negative.  Incidental large calibre distal esoph stricture.     Appetite generally marginal.  Yesterday worse than usual and ate hardly anything but did not feel ill.  Well enough to attend a [place] celebration at [employer].  This AM weak and fell on her way to bathroom.  Lives with her son who says she never lost consciousness but did vomit dark blood x 1.   Notes say she had dark stool yesterday, she is not endorsing this.  But in ED stool is dark, strongly FOBT +.  Hgb 6.8.  Platelets 150s.   BUN/Creat 121/3.6.   40 mg IV Protonix given and 1 U PRBC ordered.  BPs 102/56 >> 133/64.  Pulses in 32s.  O2 sats 97 to 99%  Pt says that the PPI was stopped along with ASA by PMD.  She received Feraheme infusion on 09/30/17 and 11/29 per order of Dr Moshe Cipro.   Up until last  24 hours no changes in GI sxs.  Not using NSAIDs.  Does not drink alcohol.  Lives with her son, Gerald Stabs.  Her other son [name] can be reached via cell phone at [phone].       Past Medical History:  Diagnosis Date  . Anemia   . CAD (coronary artery disease) 06/26/2014   admx with NSTEMI in 12/13 >> tx medically due to CKD  . Chronic combined systolic and diastolic CHF (congestive heart failure) ([place]) 11/08/2012   Echo 12/13:  EF 35-40, inferior and inferoseptal akinesis, grade 2 diastolic dysfunction, MAC, dysfunction/tethering of the posterior papillary muscle as a result of inferior infarct, severe MR, moderate PI, PASP 50, small circumferential pericardial effusion without hemodynamic compromise  . CKD (chronic kidney disease)   . Diabetes mellitus without complication ([place])   . Former smoker   . Hyperlipidemia   . Hypertension   . Pulmonary hypertension ([place]) 11/08/2012  . Severe mitral regurgitation 11/08/2012   Due to papillary muscle dysfunction from prior  inf infarct    Past Surgical History:  Procedure Laterality Date  . ESOPHAGOGASTRODUODENOSCOPY N/A 07/25/2017   Procedure: ESOPHAGOGASTRODUODENOSCOPY (EGD);  Surgeon: [name], MD;  Location: [facility];  Service: Gastroenterology;  Laterality: N/A;  . EYE SURGERY      Prior to Admission medications   Medication Sig Start Date End Date Taking? Authorizing Provider  amLODipine (NORVASC) 5 MG tablet Take 5 mg by mouth daily. 04/29/17   [provider]  aspirin EC 81 MG tablet Take 1 tablet (81 mg total) by mouth daily. 07/31/17   Hosie Poisson, MD  Calcium Carb-Cholecalciferol (CALTRATE 600+D) 600-800 MG-UNIT TABS Take 1 tablet by mouth daily.    [provider]  carvedilol (COREG) 3.125 MG tablet Take 1 tablet (3.125 mg total) by mouth 2 (two) times daily with a meal. 10/23/16   Nahser, Wonda Cheng, MD  dorzolamide-timolol (COSOPT) 22.3-6.8 MG/ML ophthalmic solution Place 1 drop into both eyes 2 (two)  times daily.  06/20/17   [provider]  feeding supplement, GLUCERNA SHAKE, (GLUCERNA SHAKE) LIQD Take 237 mLs by mouth 2 (two) times daily between meals. 07/26/17   Hosie Poisson, MD  furosemide (LASIX) 20 MG tablet Take 1 tablet (20 mg total) by mouth daily. 12/14/12   Nahser, Wonda Cheng, MD  glipiZIDE (GLUCOTROL) 5 MG tablet Take 2.5 mg by mouth daily before breakfast. TAKE 1/2 TABLET BY MOUTH ONE TIME DAILY    [provider]  isosorbide mononitrate (IMDUR) 30 MG 24 hr tablet Take 1 tablet (30 mg total) by mouth daily. 02/28/13   Nahser, Wonda Cheng, MD  latanoprost (XALATAN) 0.005 % ophthalmic solution Place 1 drop into both eyes at bedtime.  06/15/17   [provider]  levothyroxine (SYNTHROID, LEVOTHROID) 50 MCG tablet Take 50 mcg by mouth daily.    [provider]  Multiple Vitamins-Minerals (CENTRUM SILVER PO) Take 1 tablet by mouth daily. Dose Unknown    [provider]  pantoprazole (PROTONIX) 40 MG tablet Take 1 tablet (40 mg total) by mouth 2 (two) times daily. 07/27/17 08/26/17  Hosie Poisson, MD  pantoprazole (PROTONIX) 40 MG tablet Take 1 tablet (40 mg total) by mouth daily. 08/27/17   Hosie Poisson, MD  simvastatin (ZOCOR) 20 MG tablet Take 20 mg by mouth every evening.    [provider]    Scheduled Meds: . pantoprazole (PROTONIX) IV  40 mg Intravenous Once   Infusions: . sodium chloride     PRN Meds:    Allergies as of 12/01/2017  . (No Known Allergies)    Family History  Problem Relation Age of Onset  . Hypertension Father     Social History   Socioeconomic History  . Marital status: Widowed    Spouse name: Not on file  . Number of children: Not on file  . Years of education: Not on file  . Highest education level: Not on file  Social Needs  . Financial resource strain: Not on file  . Food insecurity - worry: Not on file  . Food insecurity - inability: Not on file  . Transportation needs - medical: Not on file   . Transportation needs - non-medical: Not on file  Occupational History  . Not on file  Tobacco Use  . Smoking status: Former Smoker    Types: Cigarettes    Last attempt to quit: 11/09/1999    Years since quitting: 18.0  . Smokeless tobacco: Never Used  Substance and Sexual Activity  . Alcohol use: No  Alcohol/week: 0.0 oz  . Drug use: No  . Sexual activity: No  Other Topics Concern  . Not on file  Social History Narrative  . Not on file    REVIEW OF SYSTEMS: Constitutional:  Weakness.   ENT:  No nose bleeds.  3 weeks ago she and her son both had upper respiratory infections.  Symptoms of sniffling and cough have resolved.  She was never treated with meds for this and did not seek medical attention. Pulm:  Denies SOB and cough.   CV: No chest pain.  No palpitations, no LE edema.  GU:  No hematuria, no frequency GI:  No dysphagia.  Per HPI Heme: Denies unusual or excessive bleeding or bruising. Transfusions:  Per HPI Neuro:  No headaches, no peripheral tingling or numbness Derm:  No itching, no rash or sores.  Endocrine:  No sweats or chills.  No polyuria or dysuria Immunization: Did not inquire as to recent or previous immunizations. Travel:  None beyond local counties in last few months.    PHYSICAL EXAM: Vital signs in last 24 hours: Vitals:   12/01/17 1141  BP: (!) 102/56  Pulse: 62  Resp: (!) 25  Temp: 97.8 F (36.6 C)  SpO2: 97%   Wt Readings from Last 3 Encounters:  12/01/17 67.1 kg (148 lb)  10/08/17 67.1 kg (148 lb)  09/30/17 68 kg (150 lb)    General: Aged, somewhat frail looking but comfortable and alert [name].  She is pale. Head: No facial asymmetry or swelling.  No signs of head trauma. Eyes: Conjunctiva pale.  No scleral icterus.  EOMI. Ears: Slightly HOH. Nose: No discharge or congestion. Mouth: Full sets of dentures in place.  Tongue midline.  Mucosa moist, clear and slightly pale. Neck: No JVD, TMG or masses. Lungs: Diminished breath  sounds globally but no labored breathing, adventitious sounds or cough. Heart: RRR.  No MRG.  S1, S2 present. Abdomen: Not tender or distended.  Soft.  No HSM, hernias, bruits.  Active bowel sounds..   Rectal: Did not repeat the rectal exam performed earlier by PA.  Nurse attending patient at the bedside confirms that this was very black, soft stool and tested FOBT positive Musc/Skeltl: No joint redness, swelling or significant deformities other than some spinal curvature/kyphosis. Extremities: No CCE.  Feet are warm.  Pedal pulses 3+ bilaterally. Neurologic: Alert.  Oriented x3.  Moves all 4 limbs, no tremor.  Limb strength not tested. Skin: No rashes, sores, bruises or suspicious lesions. Psych: Pleasant, cooperative, calm.   LAB RESULTS: Recent Labs    12/01/17 1154 12/01/17 1211  WBC 6.5  --   HGB 6.9* 6.8*  HCT 22.2* 20.0*  PLT 155  --    BMET Lab Results  Component Value Date   NA 142 12/01/2017   NA 143 08/06/2017   NA 144 07/29/2017   K 4.1 12/01/2017   K 4.5 08/06/2017   K 4.1 07/29/2017   CL 107 12/01/2017   CL 106 08/06/2017   CL 106 07/29/2017   CO2 20 08/06/2017   CO2 23 07/29/2017   CO2 26 07/26/2017   GLUCOSE 157 (H) 12/01/2017   GLUCOSE 110 (H) 08/06/2017   GLUCOSE 148 (H) 07/29/2017   BUN 103 (H) 12/01/2017   BUN 39 (H) 08/06/2017   BUN 63 (H) 07/29/2017   CREATININE 3.70 (H) 12/01/2017   CREATININE 3.34 (H) 08/06/2017   CREATININE 3.79 (H) 07/29/2017   CALCIUM 8.6 (L) 08/06/2017   CALCIUM 8.7 07/29/2017  CALCIUM 9.1 07/26/2017    RADIOLOGY STUDIES: No results found.    IMPRESSION:   *  UGI bleed.  Pyloric ulcer confirmed on EGD in September, 2018.  At some point recommended daily PPI therapy was discontinued along with aspirin.  *  Anemia.  ABL anemia on top of anemia of chronic kidney disease.    *  CKD.  Stage 4 to 5.    PLAN:     *   Transfuse PRBC as ordered, added 1 more unit for a total of 2 to be given today.  Check Hgb in  the morning.  *  EGD set for 11 AM tomorrow.  *  BID IV Protonix.     *  coags (previously normal) are pending.    *  May have clear liquids, NPO after MN.     Azucena Freed  12/01/2017, 1:03 PM Pager: [phone]     Attending physician's note   I have taken a history, examined the patient and reviewed the chart. I agree with the Advanced Practitioner's note, impression and recommendations.  Acute UGI bleed likely secondary to a recurrent ulcer with ABL anemia. Unfortunately her daily PPI to help prevent recurrent ulcer disease was discontinued. Transfusions to keep Hb > 7. IV PPI bid. Clear liquids only. EGD tomorrow.   Lucio Edward, MD [facility] [phone] Mon-Fri 8a-5p [phone] after 5p, weekends, holidays

## 2017-12-02 ENCOUNTER — Inpatient Hospital Stay (HOSPITAL_COMMUNITY): Payer: Medicare HMO | Admitting: Anesthesiology

## 2017-12-02 ENCOUNTER — Encounter (HOSPITAL_COMMUNITY)
Admission: EM | Disposition: A | Discharge status: Home / Self Care | Payer: Self-pay | Source: Home / Self Care | Attending: Family Medicine

## 2017-12-02 ENCOUNTER — Other Ambulatory Visit: Payer: Self-pay

## 2017-12-02 ENCOUNTER — Encounter (HOSPITAL_COMMUNITY): Payer: Self-pay | Admitting: Anesthesiology

## 2017-12-02 DIAGNOSIS — K449 Diaphragmatic hernia without obstruction or gangrene: Secondary | ICD-10-CM

## 2017-12-02 DIAGNOSIS — K259 Gastric ulcer, unspecified as acute or chronic, without hemorrhage or perforation: Secondary | ICD-10-CM

## 2017-12-02 DIAGNOSIS — K31819 Angiodysplasia of stomach and duodenum without bleeding: Secondary | ICD-10-CM

## 2017-12-02 HISTORY — PX: ESOPHAGOGASTRODUODENOSCOPY: SHX5428

## 2017-12-02 LAB — PROTIME-INR
INR: 1.2
Prothrombin Time: 15.1 seconds (ref 11.4–15.2)

## 2017-12-02 LAB — GLUCOSE, CAPILLARY
Glucose-Capillary: 88 mg/dL (ref 65–99)
Glucose-Capillary: 101 mg/dL — ABNORMAL HIGH (ref 65–99)
Glucose-Capillary: 114 mg/dL — ABNORMAL HIGH (ref 65–99)
Glucose-Capillary: 160 mg/dL — ABNORMAL HIGH (ref 65–99)
Glucose-Capillary: 178 mg/dL — ABNORMAL HIGH (ref 65–99)
Glucose-Capillary: 99 mg/dL (ref 65–99)

## 2017-12-02 LAB — CBC
HCT: 28.9 % — ABNORMAL LOW (ref 36.0–46.0)
HCT: 27.5 % — ABNORMAL LOW (ref 36.0–46.0)
HEMOGLOBIN: 9 g/dL — AB (ref 12.0–15.0)
Hemoglobin: 9.5 g/dL — ABNORMAL LOW (ref 12.0–15.0)
MCH: 27.7 pg (ref 26.0–34.0)
MCH: 27.5 pg (ref 26.0–34.0)
MCHC: 32.9 g/dL (ref 30.0–36.0)
MCHC: 32.7 g/dL (ref 30.0–36.0)
MCV: 84.3 fL (ref 78.0–100.0)
MCV: 84.1 fL (ref 78.0–100.0)
PLATELETS: 120 10*3/uL — AB (ref 150–400)
Platelets: 132 10*3/uL — ABNORMAL LOW (ref 150–400)
RBC: 3.43 MIL/uL — AB (ref 3.87–5.11)
RBC: 3.27 MIL/uL — AB (ref 3.87–5.11)
RDW: 16.2 % — ABNORMAL HIGH (ref 11.5–15.5)
RDW: 16.4 % — ABNORMAL HIGH (ref 11.5–15.5)
WBC: 8.6 10*3/uL (ref 4.0–10.5)
WBC: 7.3 10*3/uL (ref 4.0–10.5)

## 2017-12-02 LAB — BASIC METABOLIC PANEL
Anion gap: 14 (ref 5–15)
BUN: 118 mg/dL — AB (ref 6–20)
CO2: 18 mmol/L — ABNORMAL LOW (ref 22–32)
Calcium: 10.1 mg/dL (ref 8.9–10.3)
Chloride: 111 mmol/L (ref 101–111)
Creatinine, Ser: 3.47 mg/dL — ABNORMAL HIGH (ref 0.44–1.00)
GFR calc Af Amer: 13 mL/min — ABNORMAL LOW (ref >60)
GFR, EST NON AFRICAN AMERICAN: 11 mL/min — AB (ref >60)
GLUCOSE: 117 mg/dL — AB (ref 65–99)
POTASSIUM: 3.8 mmol/L (ref 3.5–5.1)
Sodium: 143 mmol/L (ref 135–145)

## 2017-12-02 SURGERY — EGD (ESOPHAGOGASTRODUODENOSCOPY)
Anesthesia: Monitor Anesthesia Care

## 2017-12-02 MED ORDER — PANTOPRAZOLE SODIUM 40 MG PO TBEC
40.0000 mg | DELAYED_RELEASE_TABLET | Freq: Two times a day (BID) | ORAL | Status: DC
  Administered 2017-12-02 – 2017-12-03 (×2): 40 mg via ORAL
  Filled 2017-12-02 (×2): qty 1

## 2017-12-02 MED ORDER — PROPOFOL 10 MG/ML IV BOLUS
INTRAVENOUS | Status: DC | PRN
  Administered 2017-12-02: 40 mg via INTRAVENOUS
  Administered 2017-12-02: 10 mg via INTRAVENOUS

## 2017-12-02 MED ORDER — PROPOFOL 500 MG/50ML IV EMUL
INTRAVENOUS | Status: DC | PRN
  Administered 2017-12-02: 75 ug/kg/min via INTRAVENOUS

## 2017-12-02 NOTE — Interval H&P Note (Signed)
History and Physical Interval Note:  12/02/2017 11:30 AM  Mandy Larson  has presented today for surgery, with the diagnosis of upper GI bleed with dark emesis and dark, FOBT + stool  The various methods of treatment have been discussed with the patient and family. After consideration of risks, benefits and other options for treatment, the patient has consented to  Procedure(s): ESOPHAGOGASTRODUODENOSCOPY (EGD) (N/A) as a surgical intervention .  The patient's history has been reviewed, patient examined, no change in status, stable for surgery.  I have reviewed the patient's chart and labs.  Questions were answered to the patient's satisfaction.     Pricilla Riffle. [facility] Plan

## 2017-12-02 NOTE — Transfer of Care (Signed)
Immediate Anesthesia Transfer of Care Note  Patient: Mandy Larson  Procedure(s) Performed: ESOPHAGOGASTRODUODENOSCOPY (EGD) (N/A )  Patient Location: Endoscopy Unit  Anesthesia Type:MAC  Level of Consciousness: awake, oriented and drowsy  Airway & Oxygen Therapy: Patient Spontanous Breathing and Patient connected to nasal cannula oxygen  Post-op Assessment: Report given to RN and Post -op Vital signs reviewed and stable  Post vital signs: Reviewed and stable  Last Vitals:  Vitals:   12/02/17 1100 12/02/17 1201  BP: 133/87 (!) 144/51  Pulse: 68 77  Resp: 20 19  Temp: 36.7 C 36.7 C  SpO2: 100% 98%    Last Pain:  Vitals:   12/02/17 1201  TempSrc: Oral  PainSc:          Complications: No apparent anesthesia complications

## 2017-12-02 NOTE — Op Note (Addendum)
[facility] Patient Name: Mandy Larson Procedure Date : 12/02/2017 MRN: [id number] Attending MD: Ladene Artist , MD Date of Birth: Apr 01, 1930 CSN: [id number] Age: 84 Admit Type: Inpatient Procedure:                Upper GI endoscopy Indications:              Melena Providers:                Pricilla Riffle. Fuller Plan, MD, Burtis Junes, RN, Tinnie Gens,                            Technician Referring MD:             [facility] Medicines:                Monitored Anesthesia Care Complications:            No immediate complications. Estimated Blood Loss:     Estimated blood loss: none. Procedure:                Pre-Anesthesia Assessment:                           - Prior to the procedure, a History and Physical                            was performed, and patient medications and                            allergies were reviewed. The patient's tolerance of                            previous anesthesia was also reviewed. The risks                            and benefits of the procedure and the sedation                            options and risks were discussed with the patient.                            All questions were answered, and informed consent                            was obtained. Prior Anticoagulants: The patient has                            taken no previous anticoagulant or antiplatelet                            agents. ASA Grade Assessment: III - A patient with                            severe systemic disease. After reviewing the risks  and benefits, the patient was deemed in                            satisfactory condition to undergo the procedure.                           After obtaining informed consent, the endoscope was                            passed under direct vision. Throughout the                            procedure, the patient's blood pressure, pulse, and                            oxygen saturations were  monitored continuously. The                            EG-[id number] ([id number]) scope was introduced through the                            mouth, and advanced to the second part of duodenum.                            The upper GI endoscopy was accomplished without                            difficulty. The patient tolerated the procedure                            well. Scope In: Scope Out: Findings:      The examined esophagus was normal.      One non-bleeding cratered gastric ulcer with no stigmata of bleeding was       found in the gastric antrum. The lesion was 6 mm in largest dimension.       The antrum was deformed likely from prior ulcer disease.      A single 4 mm no bleeding angiodysplastic lesion was found in the       gastric fundus. Coagulation for bleeding prevention using argon plasma       was successful.      A small hiatal hernia was present.      The exam of the stomach was otherwise normal.      The duodenal bulb and second portion of the duodenum were normal. Impression:               - Normal esophagus.                           - Non-bleeding gastric ulcer with no stigmata of                            bleeding.                           - Deformed gastric antrum.                           -  A single non-bleeding angiodysplastic lesion in                            the stomach. Treated with argon plasma coagulation                            (APC).                           - Small hiatal hernia.                           - Normal duodenal bulb and second portion of the                            duodenum.                           - No specimens collected. Moderate Sedation:      none/MAC Recommendation:           - Return patient to hospital ward for ongoing care.                           - Resume previous diet.                           - Continue present medications.                           - No aspirin, ibuprofen, naproxen, or other                             non-steroidal anti-inflammatory drugs.                           - Protonix (pantoprazole) 40 mg PO BID for 1 month                            and then 40 mg PO QD indefinitely to help prevent                            recurrent ulcers.                           - GI follow with Dr. Wilfrid Lund as needed.                           - Post hospital follow up with PCP.                           - Advance diet.                           - GI signing off. Procedure Code(s):        --- Professional ---  43255, Esophagogastroduodenoscopy, flexible,                            transoral; with control of bleeding, any method Diagnosis Code(s):        --- Professional ---                           K25.9, Gastric ulcer, unspecified as acute or                            chronic, without hemorrhage or perforation                           K31.819, Angiodysplasia of stomach and duodenum                            without bleeding                           K44.9, Diaphragmatic hernia without obstruction or                            gangrene                           K92.1, Melena (includes Hematochezia) CPT copyright 2016 [employer]. All rights reserved. The codes documented in this report are preliminary and upon coder review may  be revised to meet current compliance requirements. Ladene Artist, MD 12/02/2017 12:19:22 PM This report has been signed electronically. Number of Addenda: 0

## 2017-12-02 NOTE — Progress Notes (Signed)
PROGRESS NOTE  Mandy Larson AYT:[id number] DOB: 1930/02/06 DOA: 12/01/2017 PCP: [name], MD   LOS: 1 day   Brief Narrative / Interim history: 84 y.o. Female with history of CAD, chronic combined systolic and diastolic heart failure, severe mitral regurgitation, hypertension, pulmonary hypertension, COPD, hypothyroidism, diabetes, CKD stage IV, and recent hospitalization due to GI bleed/pyloric ulcer September 2018 presented to the ED 1/22 after a fall due to genrealized weakness and fatigue, accompanied with black vomit and black stool. Patient denied chest pain, fever, abdominal pain, confusion, dysuria.  In the ED Hemoglobin was 6.2. After 4 units hemoglobin increased to 9. FOBT positive in ED. Started on IV protonix. EGD today showed UGI bleed.Today, patient was stable with no abdominal pain. Last melena was yesterday p.m. Denied vomiting since admission. No chronic NSAID use  Assessment & Plan: Principal Problem:   Acute blood loss anemia Active Problems:   DM2 (diabetes mellitus, type 2) (HCC)   CKD (chronic kidney disease) stage 4, GFR 15-29 ml/min (HCC)   Chronic combined systolic and diastolic CHF (congestive heart failure) (HCC)   Pulmonary hypertension (HCC)   Severe mitral regurgitation   CAD (coronary artery disease)   Melena   Hypotension   GIB (gastrointestinal bleeding)   Acute blood loss anemia/GI bleed -Patient was hospitalized in September 2018 for GI bleed and hemoglobin 4.8. EGD revealed Pyloric Ulcer and was placed on PPI. Patient discontinued medication per PCP. - In ED Hemoglobin was 6.5. After 4 units of blood hemoglobin stable, 9.0. - Patient symptoms improving with no acid reflux, abdominal pain, chest pain, nausea, or vomiting - GI following patient. EGD today showed no bleeding gastric ulcer and single angiodysplastic gastric legion treated with APC - resume diet as tolerated - continue Protonix. Per GI Protonix 40mg  PO BID x 1 month then 40 mg PO  QD. - avoid NSAIDS and aspirin - monitor CBC   Melena/Hematemesis - Resolved. In ED FOBT positive. Patient reported last black stool was yesterday night. No vomiting since admission  Chronic combined systolic and diastolic CHF - Echo in 01/5572 revealed LVEF 55-60% - patient denies edema or Chest pain. Minor SOB on exertion - daily weights, monitor O&I, low salt diet - continue home meds  DM2 - BGs slightly elevated during hospital stay. 117 today - continue Novolog  CKD. Stage 4, GFR 15-29 - Baseline creatinine 3.4-3.8, stable - d/c IVF  Hypotension - Upon admission 91/79. Resolved after IVF - BP today 127/49 - continue to monitor - continue home meds for HTN  Hypothyroidism - continue home meds   DVT prophylaxis: SCD Code Status:Full Family Communication: none Disposition Plan: home  Consultants:   GI  Procedures:   EGD  Antimicrobials:  none  Subjective: Patient is alert sitting in bed. Denies chest pain, abdominal pain, or vomiting. Last melena was last night.  Objective: Vitals:   12/02/17 0800 12/02/17 0900 12/02/17 1000 12/02/17 1100  BP: (!) 146/59 (!) 129/57 (!) 131/55 133/87  Pulse: 70 66 65 68  Resp: 17 18 19 20   Temp:    98 F (36.7 C)  TempSrc:    Oral  SpO2: 97% 100% 99% 100%  Weight:      Height:        Intake/Output Summary (Last 24 hours) at 12/02/2017 1124 Last data filed at 12/02/2017 1121 Gross per 24 hour  Intake 1196.67 ml  Output 350 ml  Net 846.67 ml   Filed Weights   12/01/17 1130 12/01/17 1900 12/02/17 0500  Weight:  67.1 kg (148 lb) 63.7 kg (140 lb 6.9 oz) 65.9 kg (145 lb 4.5 oz)    Examination:  Constitutional: generalized weakness Eyes:  lids and conjunctivae normal ENMT: Mucous membranes are moist. No oropharyngeal exudates Neck: normal, supple,  Respiratory: clear to auscultation bilaterally, no wheezing, no crackles. Normal respiratory effort. No accessory muscle use.  Cardiovascular: Regular rate and  rhythm, no murmurs / rubs / gallops. No LE edema. 2+ pedal pulses. Abdomen: no tenderness. Bowel sounds positive.  Musculoskeletal:  No joint deformity upper and lower extremities. No contractures. Normal muscle tone.  Skin: no rashes, lesions, ulcers. No induration Neurologic: CN 2-12 grossly intact. Strength 5/5 in all 4.  Psychiatric: Normal judgment and insight. Alert and oriented x 3. Normal mood.    Data Reviewed: I have independently reviewed following labs and imaging studies   CBC: Recent Labs  Lab 12/01/17 1154 12/01/17 1211 12/02/17 0354 12/02/17 0702  WBC 6.5  --  8.6 7.3  HGB 6.9* 6.8* 9.5* 9.0*  HCT 22.2* 20.0* 28.9* 27.5*  MCV 88.1  --  84.3 84.1  PLT 155  --  132* 485*   Basic Metabolic Panel: Recent Labs  Lab 12/01/17 1154 12/01/17 1211 12/02/17 0354  NA 142 142 143  K 4.2 4.1 3.8  CL 108 107 111  CO2 21*  --  18*  GLUCOSE 168* 157* 117*  BUN 121* 103* 118*  CREATININE 3.65* 3.70* 3.47*  CALCIUM 10.3  --  10.1   GFR: Estimated Creatinine Clearance: 10.8 mL/min (A) (by C-G formula based on SCr of 3.47 mg/dL (H)). Liver Function Tests: Recent Labs  Lab 12/01/17 1154  AST 20  ALT 12*  ALKPHOS 33*  BILITOT 0.6  PROT 5.9*  ALBUMIN 3.1*   No results for input(s): LIPASE, AMYLASE in the last 168 hours. No results for input(s): AMMONIA in the last 168 hours. Coagulation Profile: Recent Labs  Lab 12/01/17 1319 12/02/17 0702  INR 1.17 1.20   Cardiac Enzymes: No results for input(s): CKTOTAL, CKMB, CKMBINDEX, TROPONINI in the last 168 hours. BNP (last 3 results) No results for input(s): PROBNP in the last 8760 hours. HbA1C: No results for input(s): HGBA1C in the last 72 hours. CBG: Recent Labs  Lab 12/01/17 1707 12/01/17 2146 12/02/17 0751  GLUCAP 107* 94 88   Lipid Profile: No results for input(s): CHOL, HDL, LDLCALC, TRIG, CHOLHDL, LDLDIRECT in the last 72 hours. Thyroid Function Tests: No results for input(s): TSH, T4TOTAL, FREET4,  T3FREE, THYROIDAB in the last 72 hours. Anemia Panel: No results for input(s): VITAMINB12, FOLATE, FERRITIN, TIBC, IRON, RETICCTPCT in the last 72 hours. Urine analysis:    Component Value Date/Time   COLORURINE YELLOW 12/01/2017 1437   APPEARANCEUR HAZY (A) 12/01/2017 1437   LABSPEC 1.013 12/01/2017 1437   PHURINE 5.0 12/01/2017 1437   GLUCOSEU NEGATIVE 12/01/2017 1437   HGBUR LARGE (A) 12/01/2017 1437   BILIRUBINUR NEGATIVE 12/01/2017 1437   KETONESUR NEGATIVE 12/01/2017 1437   PROTEINUR 30 (A) 12/01/2017 1437   NITRITE NEGATIVE 12/01/2017 1437   LEUKOCYTESUR TRACE (A) 12/01/2017 1437   Sepsis Labs: Invalid input(s): PROCALCITONIN, LACTICIDVEN  Recent Results (from the past 240 hour(s))  MRSA PCR Screening     Status: None   Collection Time: 12/01/17  7:18 PM  Result Value Ref Range Status   MRSA by PCR NEGATIVE NEGATIVE Final    Comment:        The GeneXpert MRSA Assay (FDA approved for NASAL specimens only), is one component of a  comprehensive MRSA colonization surveillance program. It is not intended to diagnose MRSA infection nor to guide or monitor treatment for MRSA infections.       Radiology Studies: No results found.   Scheduled Meds: . [MAR Hold] insulin aspart  0-9 Units Subcutaneous TID WC  . [MAR Hold] latanoprost  1 drop Both Eyes QHS  . [MAR Hold] levothyroxine  50 mcg Oral QAC breakfast  . [MAR Hold] pantoprazole (PROTONIX) IV  40 mg Intravenous Q12H   Continuous Infusions: . [MAR Hold] sodium chloride    . sodium chloride 75 mL/hr at 12/01/17 2100       Time spent:     Lotoya Casella, PA-S

## 2017-12-02 NOTE — H&P (View-Only) (Signed)
Daily Rounding Note  12/02/2017, 8:38 AM  LOS: 1 day   SUBJECTIVE:   Chief complaint: black stools continue.  Denies abd pain or nausea/vomiting.  Hungry.        OBJECTIVE:         Vital signs in last 24 hours:    Temp:  [96.7 F (35.9 C)-98.6 F (37 C)] 98.3 F (36.8 C) (01/23 0752) Pulse Rate:  [61-79] 71 (01/23 0752) Resp:  [12-28] 22 (01/23 0752) BP: (91-160)/(54-79) 129/59 (01/23 0752) SpO2:  [97 %-100 %] 98 % (01/23 0752) Weight:  [63.7 kg (140 lb 6.9 oz)-67.1 kg (148 lb)] 65.9 kg (145 lb 4.5 oz) (01/23 0500) Last BM Date: 12/01/17 Filed Weights   12/01/17 1130 12/01/17 1900 12/02/17 0500  Weight: 67.1 kg (148 lb) 63.7 kg (140 lb 6.9 oz) 65.9 kg (145 lb 4.5 oz)   General: aged, frail, alert, comfortable.  Not acutely ill looking   Heart: RRR Chest: fine rales in left base.  No cough or dyspnea Abdomen: soft, ND, NT.  BS present.    Extremities: no CCE Neuro/Psych:  Alert, appropriate.  Follows commands and moves all 4 limbs.    Intake/Output from previous day: 01/22 0701 - 01/23 0700 In: 1196.7 [I.V.:525; Blood:671.7] Out: 300 [Urine:300]  Intake/Output this shift: No intake/output data recorded.  Lab Results: Recent Labs    12/01/17 1154 12/01/17 1211 12/02/17 0354 12/02/17 0702  WBC 6.5  --  8.6 7.3  HGB 6.9* 6.8* 9.5* 9.0*  HCT 22.2* 20.0* 28.9* 27.5*  PLT 155  --  132* 120*   BMET Recent Labs    12/01/17 1154 12/01/17 1211 12/02/17 0354  NA 142 142 143  K 4.2 4.1 3.8  CL 108 107 111  CO2 21*  --  18*  GLUCOSE 168* 157* 117*  BUN 121* 103* 118*  CREATININE 3.65* 3.70* 3.47*  CALCIUM 10.3  --  10.1   LFT Recent Labs    12/01/17 1154  PROT 5.9*  ALBUMIN 3.1*  AST 20  ALT 12*  ALKPHOS 33*  BILITOT 0.6   PT/INR Recent Labs    12/01/17 1319 12/02/17 0702  LABPROT 14.8 15.1  INR 1.17 1.20    Scheduled Meds: . insulin aspart  0-9 Units Subcutaneous TID WC  .  latanoprost  1 drop Both Eyes QHS  . levothyroxine  50 mcg Oral QAC breakfast  . pantoprazole (PROTONIX) IV  40 mg Intravenous Q12H   Continuous Infusions: . sodium chloride    . sodium chloride 75 mL/hr at 12/01/17 2100   PRN Meds:.acetaminophen **OR** acetaminophen, bisacodyl, HYDROcodone-acetaminophen, ondansetron **OR** ondansetron (ZOFRAN) IV, senna-docusate   ASSESMENT:   *  UGI bleed.  Pyloric ulcer on EGD 07/2017.  No PPI at home.  BID IV Protonix in place.    *  Anemia.  ABL anemia on top of anemia of chronic kidney disease.  Excellent response to 2 U PRBC.  Hgb 6.8 >> 9.  *  CKD.  Stage 5.    *  Hypothyroidism.  On replacement.  TSH WNL 07/2017.      PLAN   *   EGD 11 AM today.  Continue BID IV Protonix for now.  CBC in AM.     Azucena Freed  12/02/2017, 8:38 AM Pager: [phone]    Attending physician's note   I have taken an interval history, reviewed the chart and examined the patient. I agree with the Advanced Practitioner's note,  impression and recommendations.   Lucio Edward, MD [facility] [phone] Mon-Fri 8a-5p [phone] after 5p, weekends, holidays

## 2017-12-02 NOTE — Progress Notes (Signed)
Daily Rounding Note  12/02/2017, 8:38 AM  LOS: 1 day   SUBJECTIVE:   Chief complaint: black stools continue.  Denies abd pain or nausea/vomiting.  Hungry.        OBJECTIVE:         Vital signs in last 24 hours:    Temp:  [96.7 F (35.9 C)-98.6 F (37 C)] 98.3 F (36.8 C) (01/23 0752) Pulse Rate:  [61-79] 71 (01/23 0752) Resp:  [12-28] 22 (01/23 0752) BP: (91-160)/(54-79) 129/59 (01/23 0752) SpO2:  [97 %-100 %] 98 % (01/23 0752) Weight:  [63.7 kg (140 lb 6.9 oz)-67.1 kg (148 lb)] 65.9 kg (145 lb 4.5 oz) (01/23 0500) Last BM Date: 12/01/17 Filed Weights   12/01/17 1130 12/01/17 1900 12/02/17 0500  Weight: 67.1 kg (148 lb) 63.7 kg (140 lb 6.9 oz) 65.9 kg (145 lb 4.5 oz)   General: aged, frail, alert, comfortable.  Not acutely ill looking   Heart: RRR Chest: fine rales in left base.  No cough or dyspnea Abdomen: soft, ND, NT.  BS present.    Extremities: no CCE Neuro/Psych:  Alert, appropriate.  Follows commands and moves all 4 limbs.    Intake/Output from previous day: 01/22 0701 - 01/23 0700 In: 1196.7 [I.V.:525; Blood:671.7] Out: 300 [Urine:300]  Intake/Output this shift: No intake/output data recorded.  Lab Results: Recent Labs    12/01/17 1154 12/01/17 1211 12/02/17 0354 12/02/17 0702  WBC 6.5  --  8.6 7.3  HGB 6.9* 6.8* 9.5* 9.0*  HCT 22.2* 20.0* 28.9* 27.5*  PLT 155  --  132* 120*   BMET Recent Labs    12/01/17 1154 12/01/17 1211 12/02/17 0354  NA 142 142 143  K 4.2 4.1 3.8  CL 108 107 111  CO2 21*  --  18*  GLUCOSE 168* 157* 117*  BUN 121* 103* 118*  CREATININE 3.65* 3.70* 3.47*  CALCIUM 10.3  --  10.1   LFT Recent Labs    12/01/17 1154  PROT 5.9*  ALBUMIN 3.1*  AST 20  ALT 12*  ALKPHOS 33*  BILITOT 0.6   PT/INR Recent Labs    12/01/17 1319 12/02/17 0702  LABPROT 14.8 15.1  INR 1.17 1.20    Scheduled Meds: . insulin aspart  0-9 Units Subcutaneous TID WC  .  latanoprost  1 drop Both Eyes QHS  . levothyroxine  50 mcg Oral QAC breakfast  . pantoprazole (PROTONIX) IV  40 mg Intravenous Q12H   Continuous Infusions: . sodium chloride    . sodium chloride 75 mL/hr at 12/01/17 2100   PRN Meds:.acetaminophen **OR** acetaminophen, bisacodyl, HYDROcodone-acetaminophen, ondansetron **OR** ondansetron (ZOFRAN) IV, senna-docusate   ASSESMENT:   *  UGI bleed.  Pyloric ulcer on EGD 07/2017.  No PPI at home.  BID IV Protonix in place.    *  Anemia.  ABL anemia on top of anemia of chronic kidney disease.  Excellent response to 2 U PRBC.  Hgb 6.8 >> 9.  *  CKD.  Stage 5.    *  Hypothyroidism.  On replacement.  TSH WNL 07/2017.      PLAN   *   EGD 11 AM today.  Continue BID IV Protonix for now.  CBC in AM.     Mandy Larson  12/02/2017, 8:38 AM Pager: [phone]    Attending physician's note   I have taken an interval history, reviewed the chart and examined the patient. I agree with the Advanced Practitioner's note,  impression and recommendations.   Lucio Edward, MD [facility] [phone] Mon-Fri 8a-5p [phone] after 5p, weekends, holidays

## 2017-12-02 NOTE — Anesthesia Preprocedure Evaluation (Signed)
Anesthesia Evaluation  Patient identified by MRN, date of birth, ID band Patient awake    Reviewed: Allergy & Precautions, NPO status , Patient's Chart, lab work & pertinent test results, reviewed documented beta blocker date and time   History of Anesthesia Complications Negative for: history of anesthetic complications  Airway Mallampati: I  TM Distance: >3 FB Neck ROM: Full    Dental  (+) Edentulous Upper, Edentulous Lower   Pulmonary former smoker,    breath sounds clear to auscultation       Cardiovascular hypertension, Pt. on medications and Pt. on home beta blockers (-) angina+ CAD, + Past MI and +CHF   Rhythm:Regular Rate:Normal  07/24/17 ECHO: EF 55-60%, mild MR, Pulmonary arteries: Systolic pressure was moderately increased.   PA peak pressure: 57 mm Hg (S).   Neuro/Psych negative neurological ROS     GI/Hepatic Neg liver ROS, PUD, melena   Endo/Other  diabetes, Oral Hypoglycemic AgentsHypothyroidism   Renal/GU Renal InsufficiencyRenal disease (creat 3.72, K+ 3.5)     Musculoskeletal   Abdominal   Peds  Hematology  (+) anemia , Hb 9.7, plt 164k   Anesthesia Other Findings   Reproductive/Obstetrics                             Anesthesia Physical  Anesthesia Plan  ASA: III  Anesthesia Plan: MAC   Post-op Pain Management:    Induction: Intravenous  PONV Risk Score and Plan: 2 and Ondansetron  Airway Management Planned: Natural Airway and Nasal Cannula  Additional Equipment:   Intra-op Plan:   Post-operative Plan:   Informed Consent: I have reviewed the patients History and Physical, chart, labs and discussed the procedure including the risks, benefits and alternatives for the proposed anesthesia with the patient or authorized representative who has indicated his/her understanding and acceptance.   Dental advisory given  Plan Discussed with: CRNA  Anesthesia Plan  Comments: (Plan routine monitors, MAC)        Anesthesia Quick Evaluation

## 2017-12-03 ENCOUNTER — Telehealth: Payer: Self-pay | Admitting: Gastroenterology

## 2017-12-03 LAB — CBC
HCT: 25.5 % — ABNORMAL LOW (ref 36.0–46.0)
HEMATOCRIT: 25.9 % — AB (ref 36.0–46.0)
Hemoglobin: 8.4 g/dL — ABNORMAL LOW (ref 12.0–15.0)
Hemoglobin: 8.5 g/dL — ABNORMAL LOW (ref 12.0–15.0)
MCH: 27.8 pg (ref 26.0–34.0)
MCH: 28 pg (ref 26.0–34.0)
MCHC: 32.9 g/dL (ref 30.0–36.0)
MCHC: 32.8 g/dL (ref 30.0–36.0)
MCV: 84.4 fL (ref 78.0–100.0)
MCV: 85.2 fL (ref 78.0–100.0)
PLATELETS: 108 10*3/uL — AB (ref 150–400)
PLATELETS: 115 10*3/uL — AB (ref 150–400)
RBC: 3.02 MIL/uL — AB (ref 3.87–5.11)
RBC: 3.04 MIL/uL — ABNORMAL LOW (ref 3.87–5.11)
RDW: 17.2 % — AB (ref 11.5–15.5)
RDW: 17.3 % — AB (ref 11.5–15.5)
WBC: 6.3 10*3/uL (ref 4.0–10.5)
WBC: 6.1 10*3/uL (ref 4.0–10.5)

## 2017-12-03 LAB — GLUCOSE, CAPILLARY
GLUCOSE-CAPILLARY: 111 mg/dL — AB (ref 65–99)
GLUCOSE-CAPILLARY: 129 mg/dL — AB (ref 65–99)

## 2017-12-03 MED ORDER — SENNOSIDES-DOCUSATE SODIUM 8.6-50 MG PO TABS: 1.0000 | ORAL_TABLET | Freq: Every day | ORAL | 0 refills | Status: AC

## 2017-12-03 MED ORDER — PANTOPRAZOLE SODIUM 40 MG PO TBEC: 40.0000 mg | DELAYED_RELEASE_TABLET | Freq: Two times a day (BID) | ORAL | 0 refills | Status: DC

## 2017-12-03 MED ORDER — ORAL CARE MOUTH RINSE
15.0000 mL | Freq: Two times a day (BID) | OROMUCOSAL | Status: DC
  Administered 2017-12-03: 15 mL via OROMUCOSAL

## 2017-12-03 NOTE — Anesthesia Postprocedure Evaluation (Signed)
Anesthesia Post Note  Patient: Mandy Larson  Procedure(s) Performed: ESOPHAGOGASTRODUODENOSCOPY (EGD) (N/A )     Patient location during evaluation: PACU Anesthesia Type: MAC Level of consciousness: awake and alert Pain management: pain level controlled Vital Signs Assessment: post-procedure vital signs reviewed and stable Respiratory status: spontaneous breathing Cardiovascular status: stable Anesthetic complications: no    Last Vitals:  Vitals:   12/03/17 0312 12/03/17 0748  BP: (!) 156/75 (!) 159/63  Pulse: 96 71  Resp: 20 (!) 25  Temp: 36.9 C 37.1 C  SpO2: 98% 98%    Last Pain:  Vitals:   12/03/17 0748  TempSrc: Oral  PainSc:                  Nolon Nations

## 2017-12-03 NOTE — Progress Notes (Signed)
Pt ready for discharge home with family.  Reviewed all discharge instructions, prescriptions and f/u appointments.

## 2017-12-03 NOTE — Discharge Summary (Signed)
Physician Discharge Summary  Mandy Larson  HLK:[id number]  DOB: 1929-12-10  DOA: 12/01/2017 PCP: [name], MD  Admit date: 12/01/2017 Discharge date: 12/03/2017  Admitted From: Home  Disposition:  Home   Recommendations for Outpatient Follow-up:  1. Follow up with PCP in 1 2. Follow up with GI in 3-4 weeks  3. Please obtain BMP/CBC in one week to monitor Hgb and Cr   Discharge Condition: Stable  CODE STATUS: Full  Diet recommendation: Heart Healthy / Carb Modified / Regular / Dysphagia   Brief/Interim Summary: For full details see H&P/progress note but in brief, Mandy Larson is a 84 year old female with history of CAD, CHF, hypertension, COPD, diabetes, CKD stage IV who presented to the emergency department complaining of hematemesis.  Upon ED evaluation she was found to have hemoglobin of 6.2 with FOBT positive.  She was transfused 4 units of PRBCs, GI was consulted and patient was started on IV Protonix.  Patient underwent EGD today which showed nonbleeding gastric ulcer and single angiodysplastic gastric lesion treated with APC. Recommending Protonix 40 mg BID x 1 month then daily indefinitely. Patient deemed stable for discharge.     Subjective: Patient seen and examined, she continues to be asymptomatic. Denies nausea, vomiting or abdominal pain. Overnight some black stools. Hgb stable. No acute events overnight.   Discharge Diagnoses/Hospital Course:  Acute blood loss anemia due to upper GI bleed EGD shows nonbleeding gastric ulcer and single angiodysplastic gastric lesion treated with APC. Recommending Protonix 40 mg BID x 1 month then daily indefinitely. Avoid ASA and NSAID's  Follow up with GI in 3-4 weeks   Chronic combined systolic and diastolic CHF -  Seems to be compensated, no signs of fluid overload Continue home medications with no changes    Diabetes mellitus type 2 - CBGs stable CBG's stable during hospital stay  Resume home medications  Follow up PCP    CKD stage IV - Scr at baseline Check Cr in 1 week   Hypothyroidism Stable, continue Synthroid   Hypertension - initially hypotensive now have normalized Resume home BP meds   All other chronic medical condition were stable during the hospitalization.  On the day of the discharge the patient's vitals were stable, and no other acute medical condition were reported by patient. the patient was felt safe to be discharge to home   Discharge Instructions  You were cared for by a hospitalist during your hospital stay. If you have any questions about your discharge medications or the care you received while you were in the hospital after you are discharged, you can call the unit and asked to speak with the hospitalist on call if the hospitalist that took care of you is not available. Once you are discharged, your primary care physician will handle any further medical issues. Please note that NO REFILLS for any discharge medications will be authorized once you are discharged, as it is imperative that you return to your primary care physician (or establish a relationship with a primary care physician if you do not have one) for your aftercare needs so that they can reassess your need for medications and monitor your lab values.  Discharge Instructions    Call MD for:  difficulty breathing, headache or visual disturbances   Complete by:  As directed    Call MD for:  extreme fatigue   Complete by:  As directed    Call MD for:  hives   Complete by:  As directed    Call  MD for:  persistant dizziness or light-headedness   Complete by:  As directed    Call MD for:  persistant nausea and vomiting   Complete by:  As directed    Call MD for:  redness, tenderness, or signs of infection (pain, swelling, redness, odor or green/yellow discharge around incision site)   Complete by:  As directed    Call MD for:  severe uncontrolled pain   Complete by:  As directed    Call MD for:  temperature >100.4    Complete by:  As directed    Diet - low sodium heart healthy   Complete by:  As directed    Increase activity slowly   Complete by:  As directed      Allergies as of 12/03/2017   No Known Allergies     Medication List    TAKE these medications   acetaminophen 325 MG tablet Commonly known as:  TYLENOL Take 650 mg by mouth every 6 (six) hours as needed for mild pain.   amLODipine 5 MG tablet Commonly known as:  NORVASC Take 5 mg by mouth daily.   CALTRATE 600+D 600-800 MG-UNIT Tabs Generic drug:  Calcium Carb-Cholecalciferol Take 1 tablet by mouth daily.   carvedilol 3.125 MG tablet Commonly known as:  COREG Take 1 tablet (3.125 mg total) by mouth 2 (two) times daily with a meal.   CENTRUM SILVER PO Take 1 tablet by mouth daily. Dose Unknown   dorzolamide-timolol 22.3-6.8 MG/ML ophthalmic solution Commonly known as:  COSOPT Place 1 drop into both eyes 2 (two) times daily.   feeding supplement (GLUCERNA SHAKE) Liqd Take 237 mLs by mouth 2 (two) times daily between meals.   furosemide 20 MG tablet Commonly known as:  LASIX Take 1 tablet (20 mg total) by mouth daily.   glipiZIDE 5 MG tablet Commonly known as:  GLUCOTROL Take 2.5 mg by mouth daily before breakfast.   isosorbide mononitrate 30 MG 24 hr tablet Commonly known as:  IMDUR Take 1 tablet (30 mg total) by mouth daily.   latanoprost 0.005 % ophthalmic solution Commonly known as:  XALATAN Place 1 drop into both eyes at bedtime.   levothyroxine 50 MCG tablet Commonly known as:  SYNTHROID, LEVOTHROID Take 50 mcg by mouth daily.   pantoprazole 40 MG tablet Commonly known as:  PROTONIX Take 1 tablet (40 mg total) by mouth 2 (two) times daily.   senna-docusate 8.6-50 MG tablet Commonly known as:  Senokot-S Take 1 tablet by mouth at bedtime.   simvastatin 20 MG tablet Commonly known as:  ZOCOR Take 20 mg by mouth every evening.      Follow-up Information    [name], MD. Schedule an  appointment as soon as possible for a visit in 1 week(s).   Specialty:  Internal Medicine Why:  Hospital follow up  Contact information: [place], [place] [address] [phone]        Ladene Artist, MD. Schedule an appointment as soon as possible for a visit in 1 week(s).   Specialty:  Gastroenterology Why:  Hospital follow up  Contact information: [address] [address] [phone]          No Known Allergies  Consultations:  GI    Procedures/Studies: No results found.   Discharge Exam: Vitals:   12/03/17 0748 12/03/17 1120  BP: (!) 159/63 (!) 147/57  Pulse: 71 62  Resp: (!) 25 (!) 24  Temp: 98.7 F (37.1 C) 98.2  F (36.8 C)  SpO2: 98% 98%   Vitals:   12/03/17 0312 12/03/17 0509 12/03/17 0748 12/03/17 1120  BP: (!) 156/75  (!) 159/63 (!) 147/57  Pulse: 96  71 62  Resp: 20  (!) 25 (!) 24  Temp: 98.4 F (36.9 C)  98.7 F (37.1 C) 98.2 F (36.8 C)  TempSrc: Oral  Oral Oral  SpO2: 98%  98% 98%  Weight:  68.9 kg (151 lb 14.4 oz)    Height:        General: Pt is alert, awake, not in acute distress Cardiovascular: RRR, S1/S2 +, no rubs, no gallops Respiratory: CTA bilaterally, no wheezing, no rhonchi Abdominal: Soft, NT, ND, bowel sounds + Extremities: no edema, no cyanosis   The results of significant diagnostics from this hospitalization (including imaging, microbiology, ancillary and laboratory) are listed below for reference.     Microbiology: Recent Results (from the past 240 hour(s))  MRSA PCR Screening     Status: None   Collection Time: 12/01/17  7:18 PM  Result Value Ref Range Status   MRSA by PCR NEGATIVE NEGATIVE Final    Comment:        The GeneXpert MRSA Assay (FDA approved for NASAL specimens only), is one component of a comprehensive MRSA colonization surveillance program. It is not intended to diagnose MRSA infection nor to guide or monitor treatment for MRSA infections.       Labs: BNP (last 3 results) Recent Labs    07/24/17 1114  BNP 195.0*   Basic Metabolic Panel: Recent Labs  Lab 12/01/17 1154 12/01/17 1211 12/02/17 0354  NA 142 142 143  K 4.2 4.1 3.8  CL 108 107 111  CO2 21*  --  18*  GLUCOSE 168* 157* 117*  BUN 121* 103* 118*  CREATININE 3.65* 3.70* 3.47*  CALCIUM 10.3  --  10.1   Liver Function Tests: Recent Labs  Lab 12/01/17 1154  AST 20  ALT 12*  ALKPHOS 33*  BILITOT 0.6  PROT 5.9*  ALBUMIN 3.1*   No results for input(s): LIPASE, AMYLASE in the last 168 hours. No results for input(s): AMMONIA in the last 168 hours. CBC: Recent Labs  Lab 12/01/17 1154 12/01/17 1211 12/02/17 0354 12/02/17 0702 12/03/17 0428 12/03/17 1228  WBC 6.5  --  8.6 7.3 6.3 6.1  HGB 6.9* 6.8* 9.5* 9.0* 8.4* 8.5*  HCT 22.2* 20.0* 28.9* 27.5* 25.5* 25.9*  MCV 88.1  --  84.3 84.1 84.4 85.2  PLT 155  --  132* 120* 108* 115*   Cardiac Enzymes: No results for input(s): CKTOTAL, CKMB, CKMBINDEX, TROPONINI in the last 168 hours. BNP: Invalid input(s): POCBNP CBG: Recent Labs  Lab 12/02/17 1701 12/02/17 2011 12/02/17 2254 12/03/17 0747 12/03/17 1119  GLUCAP 160* 178* 99 111* 129*   D-Dimer No results for input(s): DDIMER in the last 72 hours. Hgb A1c No results for input(s): HGBA1C in the last 72 hours. Lipid Profile No results for input(s): CHOL, HDL, LDLCALC, TRIG, CHOLHDL, LDLDIRECT in the last 72 hours. Thyroid function studies No results for input(s): TSH, T4TOTAL, T3FREE, THYROIDAB in the last 72 hours.  Invalid input(s): FREET3 Anemia work up No results for input(s): VITAMINB12, FOLATE, FERRITIN, TIBC, IRON, RETICCTPCT in the last 72 hours. Urinalysis    Component Value Date/Time   COLORURINE YELLOW 12/01/2017 1437   APPEARANCEUR HAZY (A) 12/01/2017 1437   LABSPEC 1.013 12/01/2017 1437   PHURINE 5.0 12/01/2017 1437   GLUCOSEU NEGATIVE 12/01/2017 1437   HGBUR LARGE (A)  12/01/2017 [place] 12/01/2017 [place] 12/01/2017 1437   PROTEINUR 30 (A) 12/01/2017 1437   NITRITE NEGATIVE 12/01/2017 1437   LEUKOCYTESUR TRACE (A) 12/01/2017 1437   Sepsis Labs Invalid input(s): PROCALCITONIN,  WBC,  LACTICIDVEN Microbiology Recent Results (from the past 240 hour(s))  MRSA PCR Screening     Status: None   Collection Time: 12/01/17  7:18 PM  Result Value Ref Range Status   MRSA by PCR NEGATIVE NEGATIVE Final    Comment:        The GeneXpert MRSA Assay (FDA approved for NASAL specimens only), is one component of a comprehensive MRSA colonization surveillance program. It is not intended to diagnose MRSA infection nor to guide or monitor treatment for MRSA infections.      Time coordinating discharge: 32 minutes  SIGNED:  Chipper Oman, MD  [facility] 12/03/2017, 2:33 PM  Pager please text page via  [email or web address]

## 2017-12-03 NOTE — Progress Notes (Signed)
Patient requesting code status to be changed from partial code to full code  [place] notified

## 2017-12-03 NOTE — Care Management Note (Signed)
Case Management Note  Patient Details  Name: Mandy Larson MRN: [id number] Date of Birth: 1929/12/28  Subjective/Objective: from home with son, pta indep with cane, she has PCP and medication coverage. Presented with GIB.  Hgb stable today, she is for dc.                    Action/Plan: DC home no needs.   Expected Discharge Date:  12/03/17               Expected Discharge Plan:  Home/Self Care  In-House Referral:     Discharge planning Services  CM Consult  Post Acute Care Choice:    Choice offered to:     DME Arranged:    DME Agency:     HH Arranged:    HH Agency:     Status of Service:  Completed, signed off  If discussed at [employer] of Stay Meetings, dates discussed:    Additional Comments:  Zenon Mayo, RN 12/03/2017, 2:43 PM

## 2017-12-03 NOTE — Telephone Encounter (Signed)
She said the protonix was not called in to [facility].  Looks like it was from my review of epic.  I will escribe it now to be safe

## 2017-12-04 LAB — TYPE AND SCREEN
ABO/RH(D): O POS
Antibody Screen: NEGATIVE
UNIT DIVISION: 0
Unit division: 0
Unit division: 0

## 2017-12-04 LAB — BPAM RBC
BLOOD PRODUCT EXPIRATION DATE: 201901262359
Blood Product Expiration Date: 201901282359
Blood Product Expiration Date: 201902142359
ISSUE DATE / TIME: 201901221336
ISSUE DATE / TIME: 201901221657
UNIT TYPE AND RH: 9500
Unit Type and Rh: 5100
Unit Type and Rh: 9500

## 2017-12-08 DIAGNOSIS — D485 Neoplasm of uncertain behavior of skin: Secondary | ICD-10-CM | POA: Diagnosis not present

## 2017-12-08 DIAGNOSIS — E1165 Type 2 diabetes mellitus with hyperglycemia: Secondary | ICD-10-CM | POA: Diagnosis not present

## 2017-12-09 DIAGNOSIS — E1165 Type 2 diabetes mellitus with hyperglycemia: Secondary | ICD-10-CM | POA: Diagnosis not present

## 2017-12-22 DIAGNOSIS — H5203 Hypermetropia, bilateral: Secondary | ICD-10-CM | POA: Diagnosis not present

## 2017-12-31 DIAGNOSIS — I509 Heart failure, unspecified: Secondary | ICD-10-CM | POA: Diagnosis not present

## 2017-12-31 DIAGNOSIS — I110 Hypertensive heart disease with heart failure: Secondary | ICD-10-CM | POA: Diagnosis not present

## 2017-12-31 DIAGNOSIS — N184 Chronic kidney disease, stage 4 (severe): Secondary | ICD-10-CM | POA: Diagnosis not present

## 2017-12-31 DIAGNOSIS — D631 Anemia in chronic kidney disease: Secondary | ICD-10-CM | POA: Diagnosis not present

## 2018-01-13 ENCOUNTER — Other Ambulatory Visit (HOSPITAL_COMMUNITY): Payer: Self-pay | Admitting: *Deleted

## 2018-01-13 NOTE — Discharge Instructions (Signed)
Ferumoxytol injection °What is this medicine? °FERUMOXYTOL is an iron complex. Iron is used to make healthy red blood cells, which carry oxygen and nutrients throughout the body. This medicine is used to treat iron deficiency anemia in people with chronic kidney disease. °This medicine may be used for other purposes; ask your health care provider or pharmacist if you have questions. °COMMON BRAND NAME(S): Feraheme °What should I tell my health care provider before I take this medicine? °They need to know if you have any of these conditions: °-anemia not caused by low iron levels °-high levels of iron in the blood °-magnetic resonance imaging (MRI) test scheduled °-an unusual or allergic reaction to iron, other medicines, foods, dyes, or preservatives °-pregnant or trying to get pregnant °-breast-feeding °How should I use this medicine? °This medicine is for injection into a vein. It is given by a health care professional in a hospital or clinic setting. °Talk to your pediatrician regarding the use of this medicine in children. Special care may be needed. °Overdosage: If you think you have taken too much of this medicine contact a poison control center or emergency room at once. °NOTE: This medicine is only for you. Do not share this medicine with others. °What if I miss a dose? °It is important not to miss your dose. Call your doctor or health care professional if you are unable to keep an appointment. °What may interact with this medicine? °This medicine may interact with the following medications: °-other iron products °This list may not describe all possible interactions. Give your health care provider a list of all the medicines, herbs, non-prescription drugs, or dietary supplements you use. Also tell them if you smoke, drink alcohol, or use illegal drugs. Some items may interact with your medicine. °What should I watch for while using this medicine? °Visit your doctor or healthcare professional regularly. Tell  your doctor or healthcare professional if your symptoms do not start to get better or if they get worse. You may need blood work done while you are taking this medicine. °You may need to follow a special diet. Talk to your doctor. Foods that contain iron include: whole grains/cereals, dried fruits, beans, or peas, leafy green vegetables, and organ meats (liver, kidney). °What side effects may I notice from receiving this medicine? °Side effects that you should report to your doctor or health care professional as soon as possible: °-allergic reactions like skin rash, itching or hives, swelling of the face, lips, or tongue °-breathing problems °-changes in blood pressure °-feeling faint or lightheaded, falls °-fever or chills °-flushing, sweating, or hot feelings °-swelling of the ankles or feet °Side effects that usually do not require medical attention (report to your doctor or health care professional if they continue or are bothersome): °-diarrhea °-headache °-nausea, vomiting °-stomach pain °This list may not describe all possible side effects. Call your doctor for medical advice about side effects. You may report side effects to FDA at 1-800-FDA-1088. °Where should I keep my medicine? °This drug is given in a hospital or clinic and will not be stored at home. °NOTE: This sheet is a summary. It may not cover all possible information. If you have questions about this medicine, talk to your doctor, pharmacist, or health care provider. °© 2018 Elsevier/Gold Standard (2015-11-29 12:41:49) ° ° °Epoetin Alfa injection °What is this medicine? °EPOETIN ALFA (e POE e tin AL fa) helps your body make more red blood cells. This medicine is used to treat anemia caused by chronic kidney   failure, cancer chemotherapy, or HIV-therapy. It may also be used before surgery if you have anemia. °This medicine may be used for other purposes; ask your health care provider or pharmacist if you have questions. °COMMON BRAND NAME(S):  Epogen, Procrit °What should I tell my health care provider before I take this medicine? °They need to know if you have any of these conditions: °-blood clotting disorders °-cancer patient not on chemotherapy °-cystic fibrosis °-heart disease, such as angina or heart failure °-hemoglobin level of 12 g/dL or greater °-high blood pressure °-low levels of folate, iron, or vitamin B12 °-seizures °-an unusual or allergic reaction to erythropoietin, albumin, benzyl alcohol, hamster proteins, other medicines, foods, dyes, or preservatives °-pregnant or trying to get pregnant °-breast-feeding °How should I use this medicine? °This medicine is for injection into a vein or under the skin. It is usually given by a health care professional in a hospital or clinic setting. °If you get this medicine at home, you will be taught how to prepare and give this medicine. Use exactly as directed. Take your medicine at regular intervals. Do not take your medicine more often than directed. °It is important that you put your used needles and syringes in a special sharps container. Do not put them in a trash can. If you do not have a sharps container, call your pharmacist or healthcare provider to get one. °A special MedGuide will be given to you by the pharmacist with each prescription and refill. Be sure to read this information carefully each time. °Talk to your pediatrician regarding the use of this medicine in children. While this drug may be prescribed for selected conditions, precautions do apply. °Overdosage: If you think you have taken too much of this medicine contact a poison control center or emergency room at once. °NOTE: This medicine is only for you. Do not share this medicine with others. °What if I miss a dose? °If you miss a dose, take it as soon as you can. If it is almost time for your next dose, take only that dose. Do not take double or extra doses. °What may interact with this medicine? °Do not take this medicine with  any of the following medications: °-darbepoetin alfa °This list may not describe all possible interactions. Give your health care provider a list of all the medicines, herbs, non-prescription drugs, or dietary supplements you use. Also tell them if you smoke, drink alcohol, or use illegal drugs. Some items may interact with your medicine. °What should I watch for while using this medicine? °Your condition will be monitored carefully while you are receiving this medicine. °You may need blood work done while you are taking this medicine. °What side effects may I notice from receiving this medicine? °Side effects that you should report to your doctor or health care professional as soon as possible: °-allergic reactions like skin rash, itching or hives, swelling of the face, lips, or tongue °-breathing problems °-changes in vision °-chest pain °-confusion, trouble speaking or understanding °-feeling faint or lightheaded, falls °-high blood pressure °-muscle aches or pains °-pain, swelling, warmth in the leg °-rapid weight gain °-severe headaches °-sudden numbness or weakness of the face, arm or leg °-trouble walking, dizziness, loss of balance or coordination °-seizures (convulsions) °-swelling of the ankles, feet, hands °-unusually weak or tired °Side effects that usually do not require medical attention (report to your doctor or health care professional if they continue or are bothersome): °-diarrhea °-fever, chills (flu-like symptoms) °-headaches °-nausea, vomiting °-redness, stinging,   or swelling at site where injected °This list may not describe all possible side effects. Call your doctor for medical advice about side effects. You may report side effects to FDA at 1-800-FDA-1088. °Where should I keep my medicine? °Keep out of the reach of children. °Store in a refrigerator between 2 and 8 degrees C (36 and 46 degrees F). Do not freeze or shake. Throw away any unused portion if using a single-dose vial. Multi-dose  vials can be kept in the refrigerator for up to 21 days after the initial dose. Throw away unused medicine. °NOTE: This sheet is a summary. It may not cover all possible information. If you have questions about this medicine, talk to your doctor, pharmacist, or health care provider. °© 2018 Elsevier/Gold Standard (2016-06-16 19:42:31) ° °

## 2018-01-14 ENCOUNTER — Ambulatory Visit (HOSPITAL_COMMUNITY)
Admission: RE | Admit: 2018-01-14 | Discharge: 2018-01-14 | Disposition: A | Discharge status: Home / Self Care | Payer: Medicare HMO | Source: Ambulatory Visit | Attending: Nephrology | Admitting: Nephrology

## 2018-01-14 VITALS — BP 117/66 | HR 56 | Temp 98.6°F | Resp 20 | Ht 63.0 in | Wt 148.0 lb

## 2018-01-14 DIAGNOSIS — N184 Chronic kidney disease, stage 4 (severe): Secondary | ICD-10-CM | POA: Diagnosis not present

## 2018-01-14 DIAGNOSIS — D631 Anemia in chronic kidney disease: Secondary | ICD-10-CM | POA: Diagnosis present

## 2018-01-14 LAB — POCT HEMOGLOBIN-HEMACUE: HEMOGLOBIN: 9 g/dL — AB (ref 12.0–15.0)

## 2018-01-14 MED ORDER — SODIUM CHLORIDE 0.9 % IV SOLN
510.0000 mg | INTRAVENOUS | Status: DC
  Administered 2018-01-14: 510 mg via INTRAVENOUS
  Filled 2018-01-14: qty 17

## 2018-01-14 MED ORDER — EPOETIN ALFA 20000 UNIT/ML IJ SOLN
INTRAMUSCULAR | Status: AC
  Filled 2018-01-14: qty 1

## 2018-01-14 MED ORDER — EPOETIN ALFA 20000 UNIT/ML IJ SOLN
20000.0000 [IU] | INTRAMUSCULAR | Status: DC
  Administered 2018-01-14: 20000 [IU] via SUBCUTANEOUS

## 2018-01-19 DIAGNOSIS — H401123 Primary open-angle glaucoma, left eye, severe stage: Secondary | ICD-10-CM | POA: Diagnosis not present

## 2018-01-19 DIAGNOSIS — E113292 Type 2 diabetes mellitus with mild nonproliferative diabetic retinopathy without macular edema, left eye: Secondary | ICD-10-CM | POA: Diagnosis not present

## 2018-01-21 ENCOUNTER — Ambulatory Visit (HOSPITAL_COMMUNITY)
Admission: RE | Admit: 2018-01-21 | Discharge: 2018-01-21 | Disposition: A | Discharge status: Home / Self Care | Payer: Medicare HMO | Source: Ambulatory Visit | Attending: Nephrology | Admitting: Nephrology

## 2018-01-21 DIAGNOSIS — D631 Anemia in chronic kidney disease: Secondary | ICD-10-CM | POA: Diagnosis not present

## 2018-01-21 DIAGNOSIS — N189 Chronic kidney disease, unspecified: Secondary | ICD-10-CM | POA: Insufficient documentation

## 2018-01-21 MED ORDER — FERUMOXYTOL INJECTION 510 MG/17 ML
510.0000 mg | INTRAVENOUS | Status: AC
  Administered 2018-01-21: 510 mg via INTRAVENOUS
  Filled 2018-01-21: qty 17

## 2018-01-22 ENCOUNTER — Ambulatory Visit: Payer: Medicare HMO | Admitting: Podiatry

## 2018-01-22 DIAGNOSIS — B351 Tinea unguium: Secondary | ICD-10-CM | POA: Diagnosis not present

## 2018-01-22 DIAGNOSIS — M79676 Pain in unspecified toe(s): Secondary | ICD-10-CM

## 2018-01-22 DIAGNOSIS — M79609 Pain in unspecified limb: Principal | ICD-10-CM

## 2018-01-28 ENCOUNTER — Ambulatory Visit (HOSPITAL_COMMUNITY)
Admission: RE | Admit: 2018-01-28 | Discharge: 2018-01-28 | Disposition: A | Discharge status: Home / Self Care | Payer: Medicare HMO | Source: Ambulatory Visit | Attending: Nephrology | Admitting: Nephrology

## 2018-01-28 VITALS — BP 153/69 | HR 63 | Temp 98.0°F | Resp 20

## 2018-01-28 DIAGNOSIS — D631 Anemia in chronic kidney disease: Secondary | ICD-10-CM | POA: Insufficient documentation

## 2018-01-28 DIAGNOSIS — N184 Chronic kidney disease, stage 4 (severe): Secondary | ICD-10-CM

## 2018-01-28 DIAGNOSIS — N189 Chronic kidney disease, unspecified: Secondary | ICD-10-CM | POA: Diagnosis not present

## 2018-01-28 LAB — POCT HEMOGLOBIN-HEMACUE: HEMOGLOBIN: 8.9 g/dL — AB (ref 12.0–15.0)

## 2018-01-28 MED ORDER — EPOETIN ALFA 20000 UNIT/ML IJ SOLN
INTRAMUSCULAR | Status: AC
  Filled 2018-01-28: qty 1

## 2018-01-28 MED ORDER — EPOETIN ALFA 20000 UNIT/ML IJ SOLN
20000.0000 [IU] | INTRAMUSCULAR | Status: DC
  Administered 2018-01-28: 20000 [IU] via SUBCUTANEOUS

## 2018-01-31 ENCOUNTER — Inpatient Hospital Stay (HOSPITAL_COMMUNITY)
Admission: EM | Admit: 2018-01-31 | Discharge: 2018-02-02 | DRG: 291 | Disposition: A | Discharge status: Home / Self Care | Payer: Medicare HMO | Attending: Internal Medicine | Admitting: Internal Medicine

## 2018-01-31 ENCOUNTER — Encounter (HOSPITAL_COMMUNITY): Payer: Self-pay | Admitting: Emergency Medicine

## 2018-01-31 ENCOUNTER — Emergency Department (HOSPITAL_COMMUNITY)
Admit: 2018-01-31 | Discharge: 2018-01-31 | Disposition: A | Discharge status: Home / Self Care | Payer: Medicare HMO | Attending: Emergency Medicine | Admitting: Emergency Medicine

## 2018-01-31 ENCOUNTER — Other Ambulatory Visit: Payer: Self-pay

## 2018-01-31 ENCOUNTER — Emergency Department (HOSPITAL_COMMUNITY): Payer: Medicare HMO

## 2018-01-31 DIAGNOSIS — M7989 Other specified soft tissue disorders: Secondary | ICD-10-CM | POA: Diagnosis not present

## 2018-01-31 DIAGNOSIS — Z7982 Long term (current) use of aspirin: Secondary | ICD-10-CM

## 2018-01-31 DIAGNOSIS — I252 Old myocardial infarction: Secondary | ICD-10-CM

## 2018-01-31 DIAGNOSIS — E1122 Type 2 diabetes mellitus with diabetic chronic kidney disease: Secondary | ICD-10-CM | POA: Diagnosis not present

## 2018-01-31 DIAGNOSIS — N185 Chronic kidney disease, stage 5: Secondary | ICD-10-CM | POA: Diagnosis not present

## 2018-01-31 DIAGNOSIS — N184 Chronic kidney disease, stage 4 (severe): Secondary | ICD-10-CM

## 2018-01-31 DIAGNOSIS — J449 Chronic obstructive pulmonary disease, unspecified: Secondary | ICD-10-CM | POA: Diagnosis present

## 2018-01-31 DIAGNOSIS — I340 Nonrheumatic mitral (valve) insufficiency: Secondary | ICD-10-CM | POA: Diagnosis present

## 2018-01-31 DIAGNOSIS — J9601 Acute respiratory failure with hypoxia: Secondary | ICD-10-CM | POA: Diagnosis not present

## 2018-01-31 DIAGNOSIS — I509 Heart failure, unspecified: Secondary | ICD-10-CM

## 2018-01-31 DIAGNOSIS — I5043 Acute on chronic combined systolic (congestive) and diastolic (congestive) heart failure: Secondary | ICD-10-CM | POA: Diagnosis present

## 2018-01-31 DIAGNOSIS — H409 Unspecified glaucoma: Secondary | ICD-10-CM | POA: Diagnosis present

## 2018-01-31 DIAGNOSIS — R0602 Shortness of breath: Secondary | ICD-10-CM | POA: Diagnosis not present

## 2018-01-31 DIAGNOSIS — J9600 Acute respiratory failure, unspecified whether with hypoxia or hypercapnia: Secondary | ICD-10-CM | POA: Diagnosis present

## 2018-01-31 DIAGNOSIS — I2510 Atherosclerotic heart disease of native coronary artery without angina pectoris: Secondary | ICD-10-CM | POA: Diagnosis present

## 2018-01-31 DIAGNOSIS — E876 Hypokalemia: Secondary | ICD-10-CM | POA: Diagnosis not present

## 2018-01-31 DIAGNOSIS — Z79899 Other long term (current) drug therapy: Secondary | ICD-10-CM

## 2018-01-31 DIAGNOSIS — E785 Hyperlipidemia, unspecified: Secondary | ICD-10-CM | POA: Diagnosis present

## 2018-01-31 DIAGNOSIS — I130 Hypertensive heart and chronic kidney disease with heart failure and stage 1 through stage 4 chronic kidney disease, or unspecified chronic kidney disease: Principal | ICD-10-CM | POA: Diagnosis present

## 2018-01-31 DIAGNOSIS — K219 Gastro-esophageal reflux disease without esophagitis: Secondary | ICD-10-CM | POA: Diagnosis present

## 2018-01-31 DIAGNOSIS — I110 Hypertensive heart disease with heart failure: Secondary | ICD-10-CM | POA: Diagnosis not present

## 2018-01-31 DIAGNOSIS — I272 Pulmonary hypertension, unspecified: Secondary | ICD-10-CM | POA: Diagnosis present

## 2018-01-31 DIAGNOSIS — Z7984 Long term (current) use of oral hypoglycemic drugs: Secondary | ICD-10-CM

## 2018-01-31 DIAGNOSIS — Z87891 Personal history of nicotine dependence: Secondary | ICD-10-CM

## 2018-01-31 LAB — BASIC METABOLIC PANEL
Anion gap: 11 (ref 5–15)
BUN: 42 mg/dL — ABNORMAL HIGH (ref 6–20)
CHLORIDE: 108 mmol/L (ref 101–111)
CO2: 23 mmol/L (ref 22–32)
Calcium: 8.9 mg/dL (ref 8.9–10.3)
Creatinine, Ser: 3.64 mg/dL — ABNORMAL HIGH (ref 0.44–1.00)
GFR calc Af Amer: 12 mL/min — ABNORMAL LOW (ref >60)
GFR calc non Af Amer: 10 mL/min — ABNORMAL LOW (ref >60)
GLUCOSE: 196 mg/dL — AB (ref 65–99)
POTASSIUM: 3.6 mmol/L (ref 3.5–5.1)
Sodium: 142 mmol/L (ref 135–145)

## 2018-01-31 LAB — CBC
HCT: 30 % — ABNORMAL LOW (ref 36.0–46.0)
HEMATOCRIT: 31.9 % — AB (ref 36.0–46.0)
Hemoglobin: 9.7 g/dL — ABNORMAL LOW (ref 12.0–15.0)
Hemoglobin: 9.2 g/dL — ABNORMAL LOW (ref 12.0–15.0)
MCH: 26.9 pg (ref 26.0–34.0)
MCH: 27.1 pg (ref 26.0–34.0)
MCHC: 30.4 g/dL (ref 30.0–36.0)
MCHC: 30.7 g/dL (ref 30.0–36.0)
MCV: 88.4 fL (ref 78.0–100.0)
MCV: 88.2 fL (ref 78.0–100.0)
Platelets: 237 10*3/uL (ref 150–400)
Platelets: 242 10*3/uL (ref 150–400)
RBC: 3.61 MIL/uL — ABNORMAL LOW (ref 3.87–5.11)
RBC: 3.4 MIL/uL — ABNORMAL LOW (ref 3.87–5.11)
RDW: 16.4 % — ABNORMAL HIGH (ref 11.5–15.5)
RDW: 16.7 % — AB (ref 11.5–15.5)
WBC: 6.5 10*3/uL (ref 4.0–10.5)
WBC: 5.9 10*3/uL (ref 4.0–10.5)

## 2018-01-31 LAB — BRAIN NATRIURETIC PEPTIDE: B Natriuretic Peptide: 788 pg/mL — ABNORMAL HIGH (ref 0.0–100.0)

## 2018-01-31 LAB — CREATININE, SERUM
CREATININE: 3.77 mg/dL — AB (ref 0.44–1.00)
GFR calc Af Amer: 12 mL/min — ABNORMAL LOW (ref >60)
GFR, EST NON AFRICAN AMERICAN: 10 mL/min — AB (ref >60)

## 2018-01-31 LAB — TROPONIN I: TROPONIN I: 0.04 ng/mL — AB (ref <0.03)

## 2018-01-31 LAB — GLUCOSE, CAPILLARY: GLUCOSE-CAPILLARY: 184 mg/dL — AB (ref 65–99)

## 2018-01-31 LAB — I-STAT TROPONIN, ED: Troponin i, poc: 0.01 ng/mL (ref 0.00–0.08)

## 2018-01-31 MED ORDER — INSULIN ASPART 100 UNIT/ML ~~LOC~~ SOLN
0.0000 [IU] | Freq: Three times a day (TID) | SUBCUTANEOUS | Status: DC
  Administered 2018-02-01: 1 [IU] via SUBCUTANEOUS
  Administered 2018-02-02: 2 [IU] via SUBCUTANEOUS

## 2018-01-31 MED ORDER — ENSURE ENLIVE PO LIQD
237.0000 mL | Freq: Two times a day (BID) | ORAL | Status: DC
  Administered 2018-02-02: 237 mL via ORAL

## 2018-01-31 MED ORDER — LEVOTHYROXINE SODIUM 50 MCG PO TABS
50.0000 ug | ORAL_TABLET | Freq: Every day | ORAL | Status: DC
  Administered 2018-02-01 – 2018-02-02 (×2): 50 ug via ORAL
  Filled 2018-01-31 (×2): qty 1

## 2018-01-31 MED ORDER — PANTOPRAZOLE SODIUM 40 MG PO TBEC
40.0000 mg | DELAYED_RELEASE_TABLET | Freq: Two times a day (BID) | ORAL | Status: DC
  Administered 2018-01-31 – 2018-02-02 (×4): 40 mg via ORAL
  Filled 2018-01-31 (×4): qty 1

## 2018-01-31 MED ORDER — FUROSEMIDE 10 MG/ML IJ SOLN
40.0000 mg | Freq: Three times a day (TID) | INTRAMUSCULAR | Status: DC
  Administered 2018-01-31: 40 mg via INTRAVENOUS
  Filled 2018-01-31: qty 4

## 2018-01-31 MED ORDER — HEPARIN SODIUM (PORCINE) 5000 UNIT/ML IJ SOLN
5000.0000 [IU] | Freq: Three times a day (TID) | INTRAMUSCULAR | Status: DC
  Administered 2018-01-31 – 2018-02-02 (×6): 5000 [IU] via SUBCUTANEOUS
  Filled 2018-01-31 (×6): qty 1

## 2018-01-31 MED ORDER — ACETAMINOPHEN 325 MG PO TABS: 650.0000 mg | ORAL_TABLET | Freq: Four times a day (QID) | ORAL | Status: DC | PRN

## 2018-01-31 MED ORDER — GLUCERNA SHAKE PO LIQD
237.0000 mL | Freq: Two times a day (BID) | ORAL | Status: DC
  Administered 2018-02-02: 237 mL via ORAL
  Filled 2018-01-31: qty 237

## 2018-01-31 MED ORDER — DORZOLAMIDE HCL-TIMOLOL MAL 2-0.5 % OP SOLN
1.0000 [drp] | Freq: Two times a day (BID) | OPHTHALMIC | Status: DC
  Administered 2018-01-31 – 2018-02-02 (×4): 1 [drp] via OPHTHALMIC
  Filled 2018-01-31: qty 10

## 2018-01-31 MED ORDER — SIMVASTATIN 20 MG PO TABS
20.0000 mg | ORAL_TABLET | Freq: Every evening | ORAL | Status: DC
  Administered 2018-01-31 – 2018-02-01 (×2): 20 mg via ORAL
  Filled 2018-01-31 (×2): qty 1

## 2018-01-31 MED ORDER — LATANOPROST 0.005 % OP SOLN
1.0000 [drp] | Freq: Every day | OPHTHALMIC | Status: DC
  Administered 2018-01-31 – 2018-02-01 (×2): 1 [drp] via OPHTHALMIC
  Filled 2018-01-31: qty 2.5

## 2018-01-31 MED ORDER — CARVEDILOL 3.125 MG PO TABS
3.1250 mg | ORAL_TABLET | Freq: Two times a day (BID) | ORAL | Status: DC
  Administered 2018-02-01 – 2018-02-02 (×3): 3.125 mg via ORAL
  Filled 2018-01-31 (×3): qty 1

## 2018-01-31 MED ORDER — HYDRALAZINE HCL 20 MG/ML IJ SOLN: 10.0000 mg | Freq: Three times a day (TID) | INTRAMUSCULAR | Status: DC | PRN

## 2018-01-31 MED ORDER — FUROSEMIDE 10 MG/ML IJ SOLN
40.0000 mg | Freq: Once | INTRAMUSCULAR | Status: AC
  Administered 2018-01-31: 40 mg via INTRAVENOUS
  Filled 2018-01-31: qty 4

## 2018-01-31 MED ORDER — ACETAMINOPHEN 650 MG RE SUPP: 650.0000 mg | Freq: Four times a day (QID) | RECTAL | Status: DC | PRN

## 2018-01-31 MED ORDER — ASPIRIN 81 MG PO CHEW
81.0000 mg | CHEWABLE_TABLET | Freq: Every day | ORAL | Status: DC
  Administered 2018-02-01 – 2018-02-02 (×2): 81 mg via ORAL
  Filled 2018-01-31 (×2): qty 1

## 2018-01-31 MED ORDER — ONDANSETRON HCL 4 MG/2ML IJ SOLN
4.0000 mg | Freq: Four times a day (QID) | INTRAMUSCULAR | Status: DC | PRN
Start: 2018-01-31 — End: 2018-02-02

## 2018-01-31 MED ORDER — ISOSORBIDE MONONITRATE ER 30 MG PO TB24
30.0000 mg | ORAL_TABLET | Freq: Every day | ORAL | Status: DC
  Administered 2018-02-01 – 2018-02-02 (×2): 30 mg via ORAL
  Filled 2018-01-31 (×2): qty 1

## 2018-01-31 MED ORDER — SODIUM CHLORIDE 0.9% FLUSH
3.0000 mL | Freq: Two times a day (BID) | INTRAVENOUS | Status: DC
  Administered 2018-01-31 – 2018-02-02 (×4): 3 mL via INTRAVENOUS

## 2018-01-31 MED ORDER — ONDANSETRON HCL 4 MG PO TABS: 4.0000 mg | ORAL_TABLET | Freq: Four times a day (QID) | ORAL | Status: DC | PRN

## 2018-01-31 NOTE — ED Notes (Signed)
Spoke to lab to add on BNP

## 2018-01-31 NOTE — ED Notes (Signed)
Patient continues to be in [place]

## 2018-01-31 NOTE — ED Notes (Signed)
Due to continuing low sats from 90-92% patient

## 2018-01-31 NOTE — ED Notes (Signed)
Patient noted to be 96% on RA in room after being removed from O2 for 20 minutes.  Patient sat up on side of bed and was transferred to dynamap for ambulatory monitoring, noted to be 92% after sitting on side of bed.  Patient denied SOB or dizziness, escorted to bathroom.  SpO2 remained 90-92% while in restroom.  Patient ambulated back to room, when plugged in to main monitor patient noted to be 82%.  Patient recovered without [place] O2, now at 93%

## 2018-01-31 NOTE — ED Notes (Addendum)
This RN went in to provide lasix.  Transport here for patient for vascular.  Will provide Lasix upon return to prevent patient's drive to urinate while gone at scan.  Patient transported to vascular.

## 2018-01-31 NOTE — Progress Notes (Signed)
CRITICAL VALUE ALERT  Critical Value:  Troponin 0.04   Date & Time Notified: 01/31/18 @ 2131  Provider Notified: X. Blount, NP  Orders Received/Actions taken: Will continue to monitor

## 2018-01-31 NOTE — ED Provider Notes (Signed)
[place] EMERGENCY DEPARTMENT Provider Note   CSN: [id number] Arrival date & time: 01/31/18  0805     History   Chief Complaint Chief Complaint  Patient presents with  . Shortness of Breath    HPI   Blood pressure (!) 156/71, pulse 75, temperature 97.6 F (36.4 C), temperature source Oral, resp. rate (!) 25, height _0  (1.6 m), weight 67.1 kg (148 lb), SpO2 92 %.  Mandy Larson is a 84 y.o. female of CAD, CHF, HLD, HTN, COPD, NIDDM, CKD stage IV brought in by EMS for shortness of breath, dyspnea on exertion worsening significantly over the course of last 24 hours.  There is no associated cough, fevers, chills, orthopnea or PND.  States that she has had some edema in the left foot and ankle but this is resolved.  She states that she normally sleeps with 2 pillows this is unchanged, she states that she has not slept well over the course of the last 2 nights, possibly because she feels short of breath.  There is no associated chest pain.  No leg pain, history of DVT/PE, recent immobilizations.  She has been compliant with her 20 mg of Lasix daily.  She does not weigh herself daily but she states she does periodically and she has not gained any significant weight.  She is been compliant with her Protonix (history of upper GI bleed) she denies any diarrhea, abdominal pain, melena, hematochezia.   Past Medical History:  Diagnosis Date  . Anemia   . CAD (coronary artery disease) 06/26/2014   admx with NSTEMI in 12/13 >> tx medically due to CKD  . Chronic combined systolic and diastolic CHF (congestive heart failure) ([place]) 11/08/2012   Echo 12/13:  EF 35-40, inferior and inferoseptal akinesis, grade 2 diastolic dysfunction, MAC, dysfunction/tethering of the posterior papillary muscle as a result of inferior infarct, severe MR, moderate PI, PASP 50, small circumferential pericardial effusion without hemodynamic compromise  . CKD (chronic kidney disease)   . Diabetes  mellitus without complication ([place])   . Former smoker   . Hyperlipidemia   . Hypertension   . Pulmonary hypertension ([place]) 11/08/2012  . Severe mitral regurgitation 11/08/2012   Due to papillary muscle dysfunction from prior inf infarct    Patient Active Problem List   Diagnosis Date Noted  . Gastric AVM   . GIB (gastrointestinal bleeding) 12/01/2017  . Acute gastric ulcer with hemorrhage   . Acute blood loss anemia 07/24/2017  . Melena 07/24/2017  . Hypotension 07/24/2017  . Syncope 07/24/2017  . CAD (coronary artery disease) 06/26/2014  . Chronic combined systolic and diastolic CHF (congestive heart failure) ([place]) 11/08/2012  . Pulmonary hypertension ([place]) 11/08/2012  . Severe mitral regurgitation 11/08/2012  . Acute pericardial effusion-small 11/08/2012  . Pulmonary edema 11/06/2012  . Chest pain 11/06/2012  . Hypertensive heart and chronic kidney disease with heart failure and stage 1 through stage 4 chronic kidney disease, or chronic kidney disease ([place]) 11/06/2012  . DM2 (diabetes mellitus, type 2) ([place]) 11/06/2012  . CKD (chronic kidney disease) stage 4, GFR 15-29 ml/min (HCC) 11/06/2012    Past Surgical History:  Procedure Laterality Date  . ESOPHAGOGASTRODUODENOSCOPY N/A 07/25/2017   Procedure: ESOPHAGOGASTRODUODENOSCOPY (EGD);  Surgeon: [name], MD;  Location: [facility];  Service: Gastroenterology;  Laterality: N/A;  . ESOPHAGOGASTRODUODENOSCOPY N/A 12/02/2017   Procedure: ESOPHAGOGASTRODUODENOSCOPY (EGD);  Surgeon: Ladene Artist, MD;  Location: [facility] ENDOSCOPY;  Service: Endoscopy;  Laterality: N/A;  . EYE SURGERY  OB History   None      Home Medications    Prior to Admission medications   Medication Sig Start Date End Date Taking? Authorizing Provider  amLODipine (NORVASC) 10 MG tablet Take 10 mg by mouth daily. 01/04/18  Yes [provider]  aspirin 81 MG chewable tablet Chew 81 mg by mouth daily.   Yes [provider]    Calcium Carb-Cholecalciferol (CALTRATE 600+D) 600-800 MG-UNIT TABS Take 1 tablet by mouth daily.   Yes [provider]  carvedilol (COREG) 3.125 MG tablet Take 1 tablet (3.125 mg total) by mouth 2 (two) times daily with a meal. 10/23/16  Yes Nahser, Wonda Cheng, MD  dorzolamide-timolol (COSOPT) 22.3-6.8 MG/ML ophthalmic solution Place 1 drop into both eyes 2 (two) times daily.  06/20/17  Yes [provider]  feeding supplement, GLUCERNA SHAKE, (GLUCERNA SHAKE) LIQD Take 237 mLs by mouth 2 (two) times daily between meals. 07/26/17  Yes Hosie Poisson, MD  furosemide (LASIX) 20 MG tablet Take 1 tablet (20 mg total) by mouth daily. 12/14/12  Yes Nahser, Wonda Cheng, MD  glipiZIDE (GLUCOTROL) 5 MG tablet Take 2.5 mg by mouth daily before breakfast.    Yes [provider]  isosorbide mononitrate (IMDUR) 30 MG 24 hr tablet Take 1 tablet (30 mg total) by mouth daily. 02/28/13  Yes Nahser, Wonda Cheng, MD  latanoprost (XALATAN) 0.005 % ophthalmic solution Place 1 drop into both eyes at bedtime.  06/15/17  Yes [provider]  levothyroxine (SYNTHROID, LEVOTHROID) 50 MCG tablet Take 50 mcg by mouth daily.   Yes [provider]  Multiple Vitamins-Minerals (CENTRUM SILVER PO) Take 1 tablet by mouth daily. Dose Unknown   Yes [provider]  pantoprazole (PROTONIX) 40 MG tablet Take 1 tablet (40 mg total) by mouth 2 (two) times daily. 12/03/17 01/31/18 Yes Milus Banister, MD  senna-docusate (SENOKOT-S) 8.6-50 MG tablet Take 1 tablet by mouth at bedtime. Patient taking differently: Take 1 tablet by mouth at bedtime as needed for mild constipation.  12/03/17  Yes Patrecia Pour, Christean Grief, MD  simvastatin (ZOCOR) 20 MG tablet Take 20 mg by mouth every evening.   Yes [provider]  acetaminophen (TYLENOL) 325 MG tablet Take 650 mg by mouth every 6 (six) hours as needed for mild pain.    [provider]    Family History Family History  Problem Relation Age of  Onset  . Hypertension Father     Social History Social History   Tobacco Use  . Smoking status: Former Smoker    Types: Cigarettes    Last attempt to quit: 11/09/1999    Years since quitting: 18.2  . Smokeless tobacco: Never Used  Substance Use Topics  . Alcohol use: No    Alcohol/week: 0.0 oz  . Drug use: No     Allergies   Patient has no known allergies.   Review of Systems Review of Systems  A complete review of systems was obtained and all systems are negative except as noted in the HPI and PMH.   Physical Exam Updated Vital Signs BP (!) 142/69   Pulse 61   Temp 97.6 F (36.4 C) (Oral)   Resp 20   Ht _0  (1.6 m)   Wt 67.1 kg (148 lb)   SpO2 97%   BMI 26.22 kg/m   Physical Exam  Constitutional: She is oriented to person, place, and time. She appears well-developed and well-nourished. No distress.  HENT:  Head: Normocephalic and atraumatic.  Mouth/Throat: Oropharynx is clear and moist.  Eyes: Pupils are equal, round, and reactive to light. Conjunctivae and EOM are normal.  Neck: Normal range of motion.  Cardiovascular: Normal rate, regular rhythm and intact distal pulses.  Pulmonary/Chest: Effort normal and breath sounds normal.  Abdominal: Soft. There is no tenderness.  Musculoskeletal: Normal range of motion. She exhibits edema.  Trace pitting edema bilaterally  Neurological: She is alert and oriented to person, place, and time.  Skin: She is not diaphoretic.  Psychiatric: She has a normal mood and affect.  Nursing note and vitals reviewed.    ED Treatments / Results  Labs (all labs ordered are listed, but only abnormal results are displayed) Labs Reviewed  BASIC METABOLIC PANEL - Abnormal; Notable for the following components:      Result Value   Glucose, Bld 196 (*)    BUN 42 (*)    Creatinine, Ser 3.64 (*)    GFR calc non Af Amer 10 (*)    GFR calc Af Amer 12 (*)    All other components within normal limits  CBC - Abnormal; Notable for  the following components:   RBC 3.61 (*)    Hemoglobin 9.7 (*)    HCT 31.9 (*)    RDW 16.4 (*)    All other components within normal limits  BRAIN NATRIURETIC PEPTIDE - Abnormal; Notable for the following components:   B Natriuretic Peptide 788.0 (*)    All other components within normal limits  I-STAT TROPONIN, ED    EKG EKG Interpretation  Date/Time:  Sunday January 31 2018 08:11:47 EDT Ventricular Rate:  76 PR Interval:  170 QRS Duration: 150 QT Interval:  458 QTC Calculation: 515 R Axis:   -77 Text Interpretation:  Normal sinus rhythm Right bundle branch block Left anterior fascicular block  Bifascicular block  Moderate voltage criteria for LVH, may be normal variant Anteroseptal infarct , age undetermined Abnormal ECG similar to prior 1/19 Confirmed by Aletta Edouard [phone]) on 01/31/2018 11:08:09 AM   Radiology Dg Chest 2 View  Result Date: 01/31/2018 CLINICAL DATA:  Shortness of breath. EXAM: CHEST - 2 VIEW COMPARISON:  July 24, 2017 FINDINGS: No pneumothorax. Cardiomegaly. The hila and mediastinum are unchanged. Mild increased interstitial markings in the lungs suggest pulmonary venous congestion/mild edema versus bronchitic change. A nodular density over the lateral right base may represent a nipple shadow. No other acute abnormalities. IMPRESSION: 1. Cardiomegaly and mild edema/pulmonary venous congestion. 2. A nodular density over the lateral right lung base may represent a nipple shadow. Recommend repeat imaging upon resolution of symptoms with nipple markers to ensure resolution. Electronically Signed   By: Dorise Bullion III M.D   On: 01/31/2018 09:55    Procedures Procedures (including critical care time)  Medications Ordered in ED Medications  furosemide (LASIX) injection 40 mg (40 mg Intravenous Given 01/31/18 1427)     Initial Impression / Assessment and Plan / ED Course  I have reviewed the triage vital signs and the nursing notes.  Pertinent labs &  imaging results that were available during my care of the patient were reviewed by me and considered in my medical decision making (see chart for details).  Clinical Course as of Feb 01 1627  Sun Mar 24, 527  2146 84 year old female with increased shortness of breath this morning.  She denies any fever or not coughing up any purulent secretions.  She looks very comfortable in the bed speaking in full sentences.  She does have some  peripheral edema left greater than right.  She is getting labs EKG chest x-ray and some breathing treatments.   [MB]    Clinical Course User Index [MB] Hayden Rasmussen, MD    Vitals:   01/31/18 1117 01/31/18 1130 01/31/18 1145 01/31/18 1315  BP: (!) 146/68 (!) 144/65 (!) 147/62 (!) 142/69  Pulse: (!) 59 (!) 58 (!) 57 61  Resp: (!) _0 Temp:      TempSrc:      SpO2: 100% 99% 97% 97%  Weight:      Height:        Medications  furosemide (LASIX) injection 40 mg (40 mg Intravenous Given 01/31/18 1427)    Canisha Goetzinger is 84 y.o. female presenting with shortness of breath dyspnea on exertion worsening over the course last day, patient was COPD, CAD, CHF.  Feels much better after oxygen supplementation via nasal cannula, no significant edema to the bilateral lower extremities but will check venous duplex.  Chest x-ray consistent with CHF, proBNP is elevated to 800, will give Lasix IV, ambulate with a pulse ox and reassess.  Patient prefers to go home and follow as an outpatient with her cardiologist.  Patient has not produced much urine, when she ambulates on the pulse use is 290, when she goes back into bed its hovers in the upper 80s.  She will need admission for CHF exacerbation.    Final Clinical Impressions(s) / ED Diagnoses   Final diagnoses:  Acute on chronic congestive heart failure, unspecified heart failure type [facility])    ED Discharge Orders    None       Karen Kays Charna Elizabeth 01/31/18 1630    Hayden Rasmussen, MD 02/01/18  [phone]

## 2018-01-31 NOTE — ED Notes (Signed)
Admitting at bedside.  Patient made aware of bed assignment

## 2018-01-31 NOTE — H&P (Signed)
[facility] History and Physical  Mandy Larson UYQ:[id number] DOB: 07/08/1930 DOA: 01/31/2018  Referring physician:  PCP: [name], MD   Chief Complaint: "I had trouble breathing."  HPI: Mandy Larson is a 84 y.o. female medical history significant for CAD, CKD, diabetes presents to the hospital with shortness of breath.  Patient has had worsening shortness was over the last couple days.  Has been compliant on medications.  No sick contacts.  Patient has had some cough.  Patient feels her urine output from her diuretic has remained constant.  ED course: Chest x-ray showed vascular congestion.  Patient given Lasix.  Placed on oxygen.  Tried to wean patient off oxygen and this was unsuccessful.  Patient admitted to the hospital.   Review of Systems:  As per HPI otherwise 10 point review of systems negative.    Past Medical History:  Diagnosis Date  . Anemia   . CAD (coronary artery disease) 06/26/2014   admx with NSTEMI in 12/13 >> tx medically due to CKD  . Chronic combined systolic and diastolic CHF (congestive heart failure) ([place]) 11/08/2012   Echo 12/13:  EF 35-40, inferior and inferoseptal akinesis, grade 2 diastolic dysfunction, MAC, dysfunction/tethering of the posterior papillary muscle as a result of inferior infarct, severe MR, moderate PI, PASP 50, small circumferential pericardial effusion without hemodynamic compromise  . CKD (chronic kidney disease)   . Diabetes mellitus without complication ([place])   . Former smoker   . Hyperlipidemia   . Hypertension   . Pulmonary hypertension ([place]) 11/08/2012  . Severe mitral regurgitation 11/08/2012   Due to papillary muscle dysfunction from prior inf infarct   Past Surgical History:  Procedure Laterality Date  . ESOPHAGOGASTRODUODENOSCOPY N/A 07/25/2017   Procedure: ESOPHAGOGASTRODUODENOSCOPY (EGD);  Surgeon: [name], MD;  Location: [place];  Service: Gastroenterology;  Laterality: N/A;  .  ESOPHAGOGASTRODUODENOSCOPY N/A 12/02/2017   Procedure: ESOPHAGOGASTRODUODENOSCOPY (EGD);  Surgeon: Ladene Artist, MD;  Location: [facility] ENDOSCOPY;  Service: Endoscopy;  Laterality: N/A;  . EYE SURGERY     Social History:  reports that she quit smoking about 18 years ago. Her smoking use included cigarettes. She has never used smokeless tobacco. She reports that she does not drink alcohol or use drugs.  No Known Allergies  Family History  Problem Relation Age of Onset  . Hypertension Father      Prior to Admission medications   Medication Sig Start Date End Date Taking? Authorizing Provider  amLODipine (NORVASC) 10 MG tablet Take 10 mg by mouth daily. 01/04/18  Yes [provider]  aspirin 81 MG chewable tablet Chew 81 mg by mouth daily.   Yes [provider]  Calcium Carb-Cholecalciferol (CALTRATE 600+D) 600-800 MG-UNIT TABS Take 1 tablet by mouth daily.   Yes [provider]  carvedilol (COREG) 3.125 MG tablet Take 1 tablet (3.125 mg total) by mouth 2 (two) times daily with a meal. 10/23/16  Yes Nahser, Wonda Cheng, MD  dorzolamide-timolol (COSOPT) 22.3-6.8 MG/ML ophthalmic solution Place 1 drop into both eyes 2 (two) times daily.  06/20/17  Yes [provider]  feeding supplement, GLUCERNA SHAKE, (GLUCERNA SHAKE) LIQD Take 237 mLs by mouth 2 (two) times daily between meals. 07/26/17  Yes Hosie Poisson, MD  furosemide (LASIX) 20 MG tablet Take 1 tablet (20 mg total) by mouth daily. 12/14/12  Yes Nahser, Wonda Cheng, MD  glipiZIDE (GLUCOTROL) 5 MG tablet Take 2.5 mg by mouth daily before breakfast.    Yes [provider]  isosorbide mononitrate (  IMDUR) 30 MG 24 hr tablet Take 1 tablet (30 mg total) by mouth daily. 02/28/13  Yes Nahser, Wonda Cheng, MD  latanoprost (XALATAN) 0.005 % ophthalmic solution Place 1 drop into both eyes at bedtime.  06/15/17  Yes [provider]  levothyroxine (SYNTHROID, LEVOTHROID) 50 MCG tablet Take 50 mcg by mouth daily.   Yes  [provider]  Multiple Vitamins-Minerals (CENTRUM SILVER PO) Take 1 tablet by mouth daily. Dose Unknown   Yes [provider]  pantoprazole (PROTONIX) 40 MG tablet Take 1 tablet (40 mg total) by mouth 2 (two) times daily. 12/03/17 01/31/18 Yes Milus Banister, MD  senna-docusate (SENOKOT-S) 8.6-50 MG tablet Take 1 tablet by mouth at bedtime. Patient taking differently: Take 1 tablet by mouth at bedtime as needed for mild constipation.  12/03/17  Yes Patrecia Pour, Christean Grief, MD  simvastatin (ZOCOR) 20 MG tablet Take 20 mg by mouth every evening.   Yes [provider]  acetaminophen (TYLENOL) 325 MG tablet Take 650 mg by mouth every 6 (six) hours as needed for mild pain.    [provider]   Physical Exam: Vitals:   01/31/18 1315 01/31/18 1645 01/31/18 1700 01/31/18 1730  BP: (!) 142/69 (!) 149/81 (!) 144/67 (!) 145/57  Pulse: 61 (!) 57 (!) 59 60  Resp: 20   (!) 24  Temp:      TempSrc:      SpO2: 97% 95% 97% 98%  Weight:      Height:        Wt Readings from Last 3 Encounters:  01/31/18 67.1 kg (148 lb)  01/14/18 67.1 kg (148 lb)  12/03/17 68.9 kg (151 lb 14.4 oz)    General:  Appears calm and comfortable; A&Oc3 Eyes:  PERRL, EOMI, normal lids, iris ENT:  grossly normal hearing, lips & tongue Neck:  no LAD, masses or thyromegaly Cardiovascular:  RRR, no m/r/g. No LE edema.  Respiratory:  CTA bilaterally, no w/r/r. incr wob Abdomen:  soft, ntnd Skin:  no rash or induration seen on limited exam Musculoskeletal:  grossly normal tone BUE/BLE Psychiatric:  grossly normal mood and affect, speech fluent and appropriate Neurologic:  CN 2-12 grossly intact, moves all extremities in coordinated fashion.          Labs on Admission:  Basic Metabolic Panel: Recent Labs  Lab 01/31/18 0837  NA 142  K 3.6  CL 108  CO2 23  GLUCOSE 196*  BUN 42*  CREATININE 3.64*  CALCIUM 8.9   Liver Function Tests: No results for input(s): AST, ALT, ALKPHOS,  BILITOT, PROT, ALBUMIN in the last 168 hours. No results for input(s): LIPASE, AMYLASE in the last 168 hours. No results for input(s): AMMONIA in the last 168 hours. CBC: Recent Labs  Lab 01/28/18 0952 01/31/18 0837  WBC  --  6.5  HGB 8.9* 9.7*  HCT  --  31.9*  MCV  --  88.4  PLT  --  237   Cardiac Enzymes: No results for input(s): CKTOTAL, CKMB, CKMBINDEX, TROPONINI in the last 168 hours.  BNP (last 3 results) Recent Labs    07/24/17 1114 01/31/18 0837  BNP 115.6* 788.0*    ProBNP (last 3 results) No results for input(s): PROBNP in the last 8760 hours.   Serum creatinine: 3.64 mg/dL (H) 01/31/18 0837 Estimated creatinine clearance: 10.2 mL/min (A)  CBG: No results for input(s): GLUCAP in the last 168 hours.  Radiological Exams on Admission: Dg Chest 2 View  Result Date: 01/31/2018 CLINICAL DATA:  Shortness of breath. EXAM: CHEST - 2 VIEW COMPARISON:  July 24, 2017 FINDINGS: No pneumothorax. Cardiomegaly. The hila and mediastinum are unchanged. Mild increased interstitial markings in the lungs suggest pulmonary venous congestion/mild edema versus bronchitic change. A nodular density over the lateral right base may represent a nipple shadow. No other acute abnormalities. IMPRESSION: 1. Cardiomegaly and mild edema/pulmonary venous congestion. 2. A nodular density over the lateral right lung base may represent a nipple shadow. Recommend repeat imaging upon resolution of symptoms with nipple markers to ensure resolution. Electronically Signed   By: Dorise Bullion III M.D   On: 01/31/2018 09:55    EKG: Independently reviewed. NSR. Bifasicular block. No STEMI.  Assessment/Plan Active Problems:   Acute respiratory failure (HCC)  Acute Resp Failure 2/2 CHF Serial troponin Diuresis tid 23m lasix Fluid seen on cxr Echo tomorrow Ins and outs Continuous pulse ox Will try to wean pt in AM off of O2  LLE extr swelling Has occurred before Preliminary read of lower  extremity Doppler is negative  CKD Monitor Cr daily Cr at baseline,  3.47-3.64  Glaucoma Continue Xalatan  Low thyroid Continue Synthroid 50 mcg  Reflux Continue PPI  Hyperlipidemia Continue Zocor  Poor nutrition Continue Glucerna shakes    Code Status: FC  DVT Prophylaxis: heparin Family Communication: son at bedside Disposition Plan: Pending Improvement  Status: tele obs  PElwin Mocha MD Family Medicine [facility] [email or web address] Password [email or web address]

## 2018-01-31 NOTE — ED Notes (Signed)
Patient maintaining at 92% on RA.  Occasionally drops to 88% when talking.  Patient placed on 2L [place] for comfort and support.

## 2018-01-31 NOTE — ED Notes (Signed)
PA at bedside discussing POC with patient

## 2018-01-31 NOTE — ED Notes (Signed)
Attempted report x1. 

## 2018-01-31 NOTE — Progress Notes (Signed)
VASCULAR LAB PRELIMINARY  PRELIMINARY  PRELIMINARY  PRELIMINARY  Left lower extremity venous duplex completed.    Preliminary report:  There is no DVT or SVT noted in the left lower extremity.  Called results to Ronnald Ramp, MD  Sharion Dove, RVT 01/31/2018, 1:49 PM

## 2018-01-31 NOTE — ED Triage Notes (Signed)
Pt. Stated, getting ready to go into church and was too SOB to go in. Called her son to come get her.

## 2018-01-31 NOTE — ED Triage Notes (Signed)
Put on 2L O2 at triage

## 2018-02-01 ENCOUNTER — Other Ambulatory Visit: Payer: Self-pay

## 2018-02-01 ENCOUNTER — Observation Stay (HOSPITAL_COMMUNITY): Payer: Medicare HMO

## 2018-02-01 DIAGNOSIS — I2510 Atherosclerotic heart disease of native coronary artery without angina pectoris: Secondary | ICD-10-CM | POA: Diagnosis present

## 2018-02-01 DIAGNOSIS — J9601 Acute respiratory failure with hypoxia: Secondary | ICD-10-CM | POA: Diagnosis not present

## 2018-02-01 DIAGNOSIS — E876 Hypokalemia: Secondary | ICD-10-CM | POA: Diagnosis not present

## 2018-02-01 DIAGNOSIS — J9600 Acute respiratory failure, unspecified whether with hypoxia or hypercapnia: Secondary | ICD-10-CM | POA: Diagnosis present

## 2018-02-01 DIAGNOSIS — I509 Heart failure, unspecified: Secondary | ICD-10-CM | POA: Diagnosis not present

## 2018-02-01 DIAGNOSIS — N184 Chronic kidney disease, stage 4 (severe): Secondary | ICD-10-CM | POA: Diagnosis not present

## 2018-02-01 DIAGNOSIS — Z7984 Long term (current) use of oral hypoglycemic drugs: Secondary | ICD-10-CM | POA: Diagnosis not present

## 2018-02-01 DIAGNOSIS — Z87891 Personal history of nicotine dependence: Secondary | ICD-10-CM | POA: Diagnosis not present

## 2018-02-01 DIAGNOSIS — I252 Old myocardial infarction: Secondary | ICD-10-CM | POA: Diagnosis not present

## 2018-02-01 DIAGNOSIS — E1122 Type 2 diabetes mellitus with diabetic chronic kidney disease: Secondary | ICD-10-CM | POA: Diagnosis not present

## 2018-02-01 DIAGNOSIS — Z79899 Other long term (current) drug therapy: Secondary | ICD-10-CM | POA: Diagnosis not present

## 2018-02-01 DIAGNOSIS — I130 Hypertensive heart and chronic kidney disease with heart failure and stage 1 through stage 4 chronic kidney disease, or unspecified chronic kidney disease: Secondary | ICD-10-CM | POA: Diagnosis present

## 2018-02-01 DIAGNOSIS — I340 Nonrheumatic mitral (valve) insufficiency: Secondary | ICD-10-CM

## 2018-02-01 DIAGNOSIS — Z7982 Long term (current) use of aspirin: Secondary | ICD-10-CM | POA: Diagnosis not present

## 2018-02-01 DIAGNOSIS — H409 Unspecified glaucoma: Secondary | ICD-10-CM | POA: Diagnosis present

## 2018-02-01 DIAGNOSIS — I272 Pulmonary hypertension, unspecified: Secondary | ICD-10-CM | POA: Diagnosis present

## 2018-02-01 DIAGNOSIS — J449 Chronic obstructive pulmonary disease, unspecified: Secondary | ICD-10-CM | POA: Diagnosis present

## 2018-02-01 DIAGNOSIS — K219 Gastro-esophageal reflux disease without esophagitis: Secondary | ICD-10-CM | POA: Diagnosis present

## 2018-02-01 DIAGNOSIS — N185 Chronic kidney disease, stage 5: Secondary | ICD-10-CM | POA: Diagnosis not present

## 2018-02-01 DIAGNOSIS — I5043 Acute on chronic combined systolic (congestive) and diastolic (congestive) heart failure: Secondary | ICD-10-CM | POA: Diagnosis present

## 2018-02-01 DIAGNOSIS — E785 Hyperlipidemia, unspecified: Secondary | ICD-10-CM | POA: Diagnosis present

## 2018-02-01 LAB — BASIC METABOLIC PANEL
ANION GAP: 9 (ref 5–15)
BUN: 44 mg/dL — ABNORMAL HIGH (ref 6–20)
CHLORIDE: 109 mmol/L (ref 101–111)
CO2: 25 mmol/L (ref 22–32)
Calcium: 8.9 mg/dL (ref 8.9–10.3)
Creatinine, Ser: 3.61 mg/dL — ABNORMAL HIGH (ref 0.44–1.00)
GFR calc non Af Amer: 10 mL/min — ABNORMAL LOW (ref >60)
GFR, EST AFRICAN AMERICAN: 12 mL/min — AB (ref >60)
GLUCOSE: 112 mg/dL — AB (ref 65–99)
POTASSIUM: 3.5 mmol/L (ref 3.5–5.1)
Sodium: 143 mmol/L (ref 135–145)

## 2018-02-01 LAB — CBC
HEMATOCRIT: 30.3 % — AB (ref 36.0–46.0)
HEMOGLOBIN: 9.2 g/dL — AB (ref 12.0–15.0)
MCH: 26.4 pg (ref 26.0–34.0)
MCHC: 30.4 g/dL (ref 30.0–36.0)
MCV: 87.1 fL (ref 78.0–100.0)
Platelets: 238 10*3/uL (ref 150–400)
RBC: 3.48 MIL/uL — AB (ref 3.87–5.11)
RDW: 16.4 % — ABNORMAL HIGH (ref 11.5–15.5)
WBC: 5.9 10*3/uL (ref 4.0–10.5)

## 2018-02-01 LAB — GLUCOSE, CAPILLARY
Glucose-Capillary: 134 mg/dL — ABNORMAL HIGH (ref 65–99)
Glucose-Capillary: 102 mg/dL — ABNORMAL HIGH (ref 65–99)
Glucose-Capillary: 103 mg/dL — ABNORMAL HIGH (ref 65–99)
Glucose-Capillary: 90 mg/dL (ref 65–99)

## 2018-02-01 MED ORDER — FUROSEMIDE 10 MG/ML IJ SOLN
40.0000 mg | Freq: Two times a day (BID) | INTRAMUSCULAR | Status: DC
  Administered 2018-02-01 – 2018-02-02 (×2): 40 mg via INTRAVENOUS
  Filled 2018-02-01 (×2): qty 4

## 2018-02-01 MED ORDER — ORAL CARE MOUTH RINSE
15.0000 mL | Freq: Two times a day (BID) | OROMUCOSAL | Status: DC
  Administered 2018-02-02: 15 mL via OROMUCOSAL

## 2018-02-01 NOTE — Progress Notes (Signed)
  Echocardiogram 2D Echocardiogram has been performed.  Mandy Larson 02/01/2018, 11:08 AM

## 2018-02-01 NOTE — Progress Notes (Addendum)
PROGRESS NOTE    Mandy Larson  BHA:[id number] DOB: August 30, 1930 DOA: 01/31/2018 PCP: [name], MD   Outpatient Specialists:     Brief Narrative:  Mandy Larson is a 84 y.o. female medical history significant for CAD, CKD, diabetes presents to the hospital with shortness of breath.  Patient has had worsening shortness was over the last couple days.  Has been compliant on medications.  No sick contacts.  Patient has had some cough.  Patient feels her urine output from her diuretic has remained constant.  ED course: Chest x-ray showed vascular congestion.  Patient given Lasix.  Placed on oxygen.  Tried to wean patient off oxygen and this was unsuccessful.     Assessment & Plan:   Active Problems:   Acute respiratory failure (HCC)  Acute Resp Failure 2/2 diastolic CHF -down 9.7D -change lasix to BID -echo results pending -daily weight/I/Os -wean O2 as tolerated  LLE extr swelling Duplex negative  CKD stage IV Monitor Cr daily Follows with Dr. Moshe Cipro  Glaucoma Continue Xalatan  Low thyroid Continue Synthroid 50 mcg -check TSH (last 9/18)  Reflux Continue PPI  Hyperlipidemia Continue Zocor  Poor nutrition Continue Glucerna shakes     Home O2 study Suspect patient will need higher dose of lasix at home (was only on 20mg ) and close monitoring of labs    Code Status: Full Code   Family Communication:   Disposition Plan:     Consultants:       Subjective: Breathing better but not back to baseline  Objective: Vitals:   01/31/18 2342 02/01/18 0550 02/01/18 0825 02/01/18 1146  BP:   (!) 154/64 (!) 164/62  Pulse: 76  66   Resp: 17  16   Temp: (!) 97.4 F (36.3 C)  98.2 F (36.8 C)   TempSrc: Oral  Oral   SpO2: 94%  100% 100%  Weight:  65.4 kg (144 lb 2.9 oz)    Height:        Intake/Output Summary (Last 24 hours) at 02/01/2018 1310 Last data filed at 02/01/2018 0830 Gross per 24 hour  Intake -  Output 1500 ml  Net  -1500 ml   Filed Weights   01/31/18 0819 01/31/18 1850 02/01/18 0550  Weight: 67.1 kg (148 lb) 67.9 kg (149 lb 11.1 oz) 65.4 kg (144 lb 2.9 oz)    Examination:  General exam: Appears calm and comfortable  Respiratory system: no increased work of breathing, diminished lower lobes Cardiovascular system: rrr Gastrointestinal system: +Bs, soft Central nervous system: Alert and oriented. No focal neurological deficits.     Data Reviewed: I have personally reviewed following labs and imaging studies  CBC: Recent Labs  Lab 01/28/18 0952 01/31/18 0837 01/31/18 2007 02/01/18 0304  WBC  --  6.5 5.9 5.9  HGB 8.9* 9.7* 9.2* 9.2*  HCT  --  31.9* 30.0* 30.3*  MCV  --  88.4 88.2 87.1  PLT  --  237 242 532   Basic Metabolic Panel: Recent Labs  Lab 01/31/18 0837 01/31/18 2007 02/01/18 0304  NA 142  --  143  K 3.6  --  3.5  CL 108  --  109  CO2 23  --  25  GLUCOSE 196*  --  112*  BUN 42*  --  44*  CREATININE 3.64* 3.77* 3.61*  CALCIUM 8.9  --  8.9   GFR: Estimated Creatinine Clearance: 10.2 mL/min (A) (by C-G formula based on SCr of 3.61 mg/dL (H)). Liver Function Tests: No results for  input(s): AST, ALT, ALKPHOS, BILITOT, PROT, ALBUMIN in the last 168 hours. No results for input(s): LIPASE, AMYLASE in the last 168 hours. No results for input(s): AMMONIA in the last 168 hours. Coagulation Profile: No results for input(s): INR, PROTIME in the last 168 hours. Cardiac Enzymes: Recent Labs  Lab 01/31/18 2007  TROPONINI 0.04*   BNP (last 3 results) No results for input(s): PROBNP in the last 8760 hours. HbA1C: No results for input(s): HGBA1C in the last 72 hours. CBG: Recent Labs  Lab 01/31/18 2239 02/01/18 0829 02/01/18 1240  GLUCAP 184* 134* 102*   Lipid Profile: No results for input(s): CHOL, HDL, LDLCALC, TRIG, CHOLHDL, LDLDIRECT in the last 72 hours. Thyroid Function Tests: No results for input(s): TSH, T4TOTAL, FREET4, T3FREE, THYROIDAB in the last 72  hours. Anemia Panel: No results for input(s): VITAMINB12, FOLATE, FERRITIN, TIBC, IRON, RETICCTPCT in the last 72 hours. Urine analysis:    Component Value Date/Time   COLORURINE YELLOW 12/01/2017 1437   APPEARANCEUR HAZY (A) 12/01/2017 1437   LABSPEC 1.013 12/01/2017 1437   PHURINE 5.0 12/01/2017 1437   GLUCOSEU NEGATIVE 12/01/2017 1437   HGBUR LARGE (A) 12/01/2017 1437   BILIRUBINUR NEGATIVE 12/01/2017 1437   KETONESUR NEGATIVE 12/01/2017 1437   PROTEINUR 30 (A) 12/01/2017 1437   NITRITE NEGATIVE 12/01/2017 1437   LEUKOCYTESUR TRACE (A) 12/01/2017 1437     )No results found for this or any previous visit (from the past 240 hour(s)).    Anti-infectives (From admission, onward)   None       Radiology Studies: Dg Chest 2 View  Result Date: 01/31/2018 CLINICAL DATA:  Shortness of breath. EXAM: CHEST - 2 VIEW COMPARISON:  July 24, 2017 FINDINGS: No pneumothorax. Cardiomegaly. The hila and mediastinum are unchanged. Mild increased interstitial markings in the lungs suggest pulmonary venous congestion/mild edema versus bronchitic change. A nodular density over the lateral right base may represent a nipple shadow. No other acute abnormalities. IMPRESSION: 1. Cardiomegaly and mild edema/pulmonary venous congestion. 2. A nodular density over the lateral right lung base may represent a nipple shadow. Recommend repeat imaging upon resolution of symptoms with nipple markers to ensure resolution. Electronically Signed   By: Dorise Bullion III M.D   On: 01/31/2018 09:55        Scheduled Meds: . aspirin  81 mg Oral Daily  . carvedilol  3.125 mg Oral BID WC  . dorzolamide-timolol  1 drop Both Eyes BID  . feeding supplement (ENSURE ENLIVE)  237 mL Oral BID BM  . feeding supplement (GLUCERNA SHAKE)  237 mL Oral BID BM  . furosemide  40 mg Intravenous BID  . heparin  5,000 Units Subcutaneous Q8H  . insulin aspart  0-9 Units Subcutaneous TID WC  . isosorbide mononitrate  30 mg Oral  Daily  . latanoprost  1 drop Both Eyes QHS  . levothyroxine  50 mcg Oral QAC breakfast  . mouth rinse  15 mL Mouth Rinse BID  . pantoprazole  40 mg Oral BID  . simvastatin  20 mg Oral QPM  . sodium chloride flush  3 mL Intravenous Q12H   Continuous Infusions:   LOS: 0 days    Time spent: 35 min    Geradine Girt, DO [facility] Pager [phone]  If 7PM-7AM, please contact night-coverage [email or web address] Password [email or web address] 02/01/2018, 1:10 PM

## 2018-02-01 NOTE — Progress Notes (Signed)
SATURATION QUALIFICATIONS: (This note is used to comply with regulatory documentation for home oxygen)  Patient Saturations on Room Air at Rest = 94%  Patient Saturations on Room Air while Ambulating = 90%  Patient Saturations on (NT) Liters of oxygen while Ambulating = (NT)%  Please briefly explain why patient needs home oxygen: NA- pt did well on RA maintaining sats at 90% with no reports of DOE.  As of this reading pt does not need home O2.   Barbarann Ehlers [place], [place], DPT [phone]

## 2018-02-01 NOTE — Care Management Note (Signed)
Case Management Note  Patient Details  Name: Mandy Larson MRN: [id number] Date of Birth: 1930/09/15  Subjective/Objective:                 Patient from home w son. Admitted w resp failure. No home oxygen needs. CM will continue to follow.    Action/Plan:   Expected Discharge Date:                  Expected Discharge Plan:  Home/Self Care  In-House Referral:     Discharge planning Services  CM Consult  Post Acute Care Choice:    Choice offered to:     DME Arranged:    DME Agency:     HH Arranged:    HH Agency:     Status of Service:  In process, will continue to follow  If discussed at Long Length of Stay Meetings, dates discussed:    Additional Comments:  Carles Collet, RN 02/01/2018, 3:47 PM

## 2018-02-01 NOTE — Evaluation (Signed)
Physical Therapy Evaluation Patient Details Name: Mandy Larson MRN: [id number] DOB: 1930/06/24 Today's Date: 02/01/2018   History of Present Illness  84 y.o. female admitted on 01/31/18 for SOB.  Pt dx with acute respiratory failure and needed supplemental O2 to maintain sats.  Dx with diastolic CHF.  Pt with other significant PMH of severe mitral regurgitation, HTN, DM, CKD, CAD, and anemia.  Clinical Impression  Pt is a bit weak and wobbly from acute illness.  She was able to walk the halls on RA with lowest O2 reading 90%.  PT will follow acutely to mobilize and monitor vitals to ensure safe d/c home.   PT to follow acutely for deficits listed below.       Follow Up Recommendations No PT follow up    Equipment Recommendations  None recommended by PT    Recommendations for Other Services   NA    Precautions / Restrictions Precautions Precautions: Other (comment) Precaution Comments: monitor O2 sats      Mobility  Bed Mobility Overal bed mobility: Modified Independent                Transfers Overall transfer level: Needs assistance   Transfers: Sit to/from Stand Sit to Stand: Supervision         General transfer comment: for safety as pt has been in bed mostly since she has been here (minus trips to the bathroom).   Ambulation/Gait Ambulation/Gait assistance: Supervision Ambulation Distance (Feet): 300 Feet Assistive device: None Gait Pattern/deviations: Step-through pattern;Staggering left;Staggering right Gait velocity: decreased   General Gait Details: very mildly staggering gait pattern, hand supported on dynamap, but likely not needed.  Midly staggering, but pt reports she usually uses a cane at home.          Balance Overall balance assessment: Needs assistance Sitting-balance support: Feet supported;No upper extremity supported Sitting balance-Leahy Scale: Good     Standing balance support: No upper extremity supported Standing balance-Leahy  Scale: Fair                               Pertinent Vitals/Pain Pain Assessment: No/denies pain    Home Living Family/patient expects to be discharged to:: Private residence Living Arrangements: Children Available Help at Discharge: Family;Available 24 hours/day Type of Home: House Home Access: Stairs to enter Entrance Stairs-Rails: None Entrance Stairs-Number of Steps: 2 Home Layout: Multi-level;Bed/bath upstairs Home Equipment: Cane - single point;Shower seat      Prior Function Level of Independence: Independent         Comments: Pt still drives, cooks and cleans, does her own cleaning.      Hand Dominance   Dominant Hand: Right    Extremity/Trunk Assessment   Upper Extremity Assessment Upper Extremity Assessment: Overall WFL for tasks assessed    Lower Extremity Assessment Lower Extremity Assessment: Overall WFL for tasks assessed    Cervical / Trunk Assessment Cervical / Trunk Assessment: Normal  Communication   Communication: No difficulties  Cognition Arousal/Alertness: Awake/alert Behavior During Therapy: WFL for tasks assessed/performed Overall Cognitive Status: Within Functional Limits for tasks assessed                                        General Comments General comments (skin integrity, edema, etc.): O2 sats never dropped below 90% on RA during gait.  Assessment/Plan    PT Assessment Patient needs continued PT services  PT Problem List Decreased activity tolerance;Decreased balance;Decreased mobility       PT Treatment Interventions DME instruction;Gait training;Stair training;Functional mobility training;Therapeutic activities;Therapeutic exercise;Balance training;Patient/family education    PT Goals (Current goals can be found in the Care Plan section)  Acute Rehab PT Goals Patient Stated Goal: to feel better and go home PT Goal Formulation: With patient Time For Goal Achievement:  02/15/18 Potential to Achieve Goals: Good    Frequency Min 3X/week           AM-PAC PT "6 Clicks" Daily Activity  Outcome Measure Difficulty turning over in bed (including adjusting bedclothes, sheets and blankets)?: None Difficulty moving from lying on back to sitting on the side of the bed? : A Little Difficulty sitting down on and standing up from a chair with arms (e.g., wheelchair, bedside commode, etc,.)?: None Help needed moving to and from a bed to chair (including a wheelchair)?: None Help needed walking in hospital room?: None Help needed climbing 3-5 steps with a railing? : A Little 6 Click Score: 22    End of Session   Activity Tolerance: Patient tolerated treatment well Patient left: in chair;with call bell/phone within reach Nurse Communication: Mobility status PT Visit Diagnosis: Other abnormalities of gait and mobility (R26.89)    Time: 6712-4580 PT Time Calculation (min) (ACUTE ONLY): 26 min   Charges:          Wells Guiles B. Mariabelen Pressly, PT, DPT [phone]   PT Evaluation $PT Eval Moderate Complexity: 1 Mod PT Treatments $Gait Training: 8-22 mins   02/01/2018, 1:56 PM

## 2018-02-02 DIAGNOSIS — E1122 Type 2 diabetes mellitus with diabetic chronic kidney disease: Secondary | ICD-10-CM

## 2018-02-02 DIAGNOSIS — N185 Chronic kidney disease, stage 5: Secondary | ICD-10-CM

## 2018-02-02 LAB — BASIC METABOLIC PANEL
Anion gap: 11 (ref 5–15)
BUN: 49 mg/dL — ABNORMAL HIGH (ref 6–20)
CALCIUM: 8.9 mg/dL (ref 8.9–10.3)
CO2: 26 mmol/L (ref 22–32)
CREATININE: 3.89 mg/dL — AB (ref 0.44–1.00)
Chloride: 105 mmol/L (ref 101–111)
GFR calc Af Amer: 11 mL/min — ABNORMAL LOW (ref >60)
GFR calc non Af Amer: 10 mL/min — ABNORMAL LOW (ref >60)
Glucose, Bld: 93 mg/dL (ref 65–99)
Potassium: 3.4 mmol/L — ABNORMAL LOW (ref 3.5–5.1)
SODIUM: 142 mmol/L (ref 135–145)

## 2018-02-02 LAB — CBC
HCT: 29.8 % — ABNORMAL LOW (ref 36.0–46.0)
Hemoglobin: 9.1 g/dL — ABNORMAL LOW (ref 12.0–15.0)
MCH: 26.7 pg (ref 26.0–34.0)
MCHC: 30.5 g/dL (ref 30.0–36.0)
MCV: 87.4 fL (ref 78.0–100.0)
Platelets: 243 10*3/uL (ref 150–400)
RBC: 3.41 MIL/uL — AB (ref 3.87–5.11)
RDW: 16.2 % — AB (ref 11.5–15.5)
WBC: 4.9 10*3/uL (ref 4.0–10.5)

## 2018-02-02 LAB — TSH: TSH: 1.416 u[IU]/mL (ref 0.350–4.500)

## 2018-02-02 LAB — GLUCOSE, CAPILLARY
Glucose-Capillary: 86 mg/dL (ref 65–99)
Glucose-Capillary: 188 mg/dL — ABNORMAL HIGH (ref 65–99)

## 2018-02-02 MED ORDER — POTASSIUM CHLORIDE CRYS ER 20 MEQ PO TBCR
40.0000 meq | EXTENDED_RELEASE_TABLET | Freq: Once | ORAL | Status: AC
  Administered 2018-02-02: 40 meq via ORAL
  Filled 2018-02-02: qty 2

## 2018-02-02 MED ORDER — FUROSEMIDE 20 MG PO TABS: 20.0000 mg | ORAL_TABLET | Freq: Two times a day (BID) | ORAL | 0 refills | Status: DC

## 2018-02-02 MED ORDER — PANTOPRAZOLE SODIUM 40 MG PO TBEC: 40.0000 mg | DELAYED_RELEASE_TABLET | Freq: Every day | ORAL | 0 refills | Status: AC

## 2018-02-02 MED ORDER — POTASSIUM CHLORIDE ER 10 MEQ PO TBCR: 10.0000 meq | EXTENDED_RELEASE_TABLET | Freq: Every day | ORAL | 0 refills | Status: DC

## 2018-02-02 MED ORDER — FUROSEMIDE 20 MG PO TABS: 20.0000 mg | ORAL_TABLET | Freq: Every day | ORAL | Status: DC

## 2018-02-02 MED ORDER — FUROSEMIDE 20 MG PO TABS: 20.0000 mg | ORAL_TABLET | Freq: Two times a day (BID) | ORAL | Status: DC

## 2018-02-02 NOTE — Progress Notes (Signed)
Physical Therapy Treatment Patient Details Name: Mandy Larson MRN: [id number] DOB: [birth year] Today's Date: 02/02/2018    History of Present Illness 84 y.o. female admitted on 01/31/18 for SOB.  Pt dx with acute respiratory failure and needed supplemental O2 to maintain sats.  Dx with diastolic CHF.  Pt with other significant PMH of severe mitral regurgitation, HTN, DM, CKD, CAD, and anemia.    PT Comments    Pt progressing well towards functional mobility goals. She requires supervision for bed mobility, transfers, and ambulation with SPC. Mild instability noted with ambulation but no LOB. Able to ascend/descend full flight of stairs with 1 rail and SPC. SpO2 88% during stair negotiation but quickly recovered to >90% with standing rest break. SpO2 remained >90% for rest of session. Current plan remains appropriate. Will continue to follow acutely and progress as tolerated.    Follow Up Recommendations  No PT follow up     Equipment Recommendations  None recommended by PT    Recommendations for Other Services       Precautions / Restrictions Precautions Precautions: Other (comment) Precaution Comments: monitor O2 sats Restrictions Weight Bearing Restrictions: No    Mobility  Bed Mobility Overal bed mobility: Modified Independent                Transfers Overall transfer level: Needs assistance Equipment used: Straight cane Transfers: Sit to/from Stand Sit to Stand: Supervision         General transfer comment: supervision for safety due to mild instability  Ambulation/Gait Ambulation/Gait assistance: Supervision Ambulation Distance (Feet): 400 Feet Assistive device: Straight cane Gait Pattern/deviations: Step-through pattern;Drifts right/left Gait velocity: decreased   General Gait Details: pt with very slow gait but states this is her baseline, supervision for mild instability. pt able to manage [facility] safely without assist   Stairs Stairs: Yes   Stair  Management: One rail Left;Step to pattern;Forwards;With cane Number of Stairs: 10 General stair comments: supervision for safety to ascend/descend stairs. standing rest break at top and bottom of flight with SpO2 88% that quickly recovered to 90%  Wheelchair Mobility    Modified Rankin (Stroke Patients Only)       Balance Overall balance assessment: Needs assistance Sitting-balance support: Feet supported;No upper extremity supported Sitting balance-Leahy Scale: Good     Standing balance support: Single extremity supported;During functional activity Standing balance-Leahy Scale: Fair Standing balance comment: able to stand unsupported at sink for hand washing, relies on 1 UE supported on [facility] for ambulation                            Cognition Arousal/Alertness: Awake/alert Behavior During Therapy: WFL for tasks assessed/performed Overall Cognitive Status: Within Functional Limits for tasks assessed                                        Exercises      General Comments General comments (skin integrity, edema, etc.): O2 sats briefly dropped to 88% during stair negotiation but quickly recovered, no SOB noted, O2>90% for rest of session      Pertinent Vitals/Pain Pain Assessment: No/denies pain    Home Living                      Prior Function            PT Goals (current goals  can now be found in the care plan section) Acute Rehab PT Goals Patient Stated Goal: to feel better and go home PT Goal Formulation: With patient Time For Goal Achievement: 02/15/18 Potential to Achieve Goals: Good Progress towards PT goals: Progressing toward goals    Frequency    Min 3X/week      PT Plan Current plan remains appropriate    Co-evaluation              AM-PAC PT "6 Clicks" Daily Activity  Outcome Measure  Difficulty turning over in bed (including adjusting bedclothes, sheets and blankets)?: None Difficulty moving from  lying on back to sitting on the side of the bed? : None Difficulty sitting down on and standing up from a chair with arms (e.g., wheelchair, bedside commode, etc,.)?: None Help needed moving to and from a bed to chair (including a wheelchair)?: None Help needed walking in hospital room?: None Help needed climbing 3-5 steps with a railing? : A Little 6 Click Score: 23    End of Session Equipment Utilized During Treatment: Gait belt Activity Tolerance: Patient tolerated treatment well Patient left: in chair;with call bell/phone within reach;Other (comment)(O2 1L [place] reapplied) Nurse Communication: Mobility status PT Visit Diagnosis: Other abnormalities of gait and mobility (R26.89)     Time: 7225-7505 PT Time Calculation (min) (ACUTE ONLY): 18 min  Charges:  $Gait Training: 8-22 mins                    G Codes:       Vic Ripper, SPT   Vic Ripper 02/02/2018, 9:52 AM

## 2018-02-02 NOTE — Discharge Instructions (Signed)
Call MD: Anytime you have any of the following symptoms: 1) 3 pound weight gain in 24 hours or 5 pounds in 1 week 2) shortness of breath, with or without a dry hacking cough 3) swelling in the hands, feet or stomach 4) if you have to sleep on extra pillows at night in order to breathe.

## 2018-02-02 NOTE — Discharge Summary (Signed)
Physician Discharge Summary  Brittiney Dicostanzo YIF:[id number] DOB: Jul 26, 1930 DOA: 01/31/2018  PCP: [name], MD  Admit date: 01/31/2018 Discharge date: 02/02/2018   Recommendations for Outpatient Follow-Up:   1. BMP 1 week re: cr and K-- lasix adjusted 2. Have spoken with family and asked then to accompany to specialist appointments, especially renal as patient approaching need for HD--- do not think she would be a good candidate 3. Also need close follow up with Dr. Cathie Olden 4. Low salt diet encourage--- eating canned goods   Discharge Diagnosis:   Active Problems:   Acute respiratory failure [facility])   Discharge disposition:  Home.   Discharge Condition: Improved.  Diet recommendation: Low sodium, heart healthy.  Carbohydrate-modified  Wound care: None.   History of Present Illness:   Mandy Larson is a 84 y.o. female medical history significant for CAD, CKD, diabetes presents to the hospital with shortness of breath.  Patient has had worsening shortness was over the last couple days.  Has been compliant on medications.  No sick contacts.  Patient has had some cough.  Patient feels her urine output from her diuretic has remained constant.  ED course: Chest x-ray showed vascular congestion.  Patient given Lasix.  Placed on oxygen.  Tried to wean patient off oxygen and this was unsuccessful.  Patient admitted to the hospital.     Hospital Course by Problem:   Acute Resp Failure 2/2 combined systolic and diastolic CHF -down 2L with IV lasix -change lasix to BID 20 mg from daily -echo:- The inferolateral wall motion abnormality is more pronounced and   MR has worsened since last September. -cardiology follow up outpatient -off O2  Hypokalemia -repleted and placed on daily supplement -will need frequent labs checks  LLE extr swelling Duplex negative for DVT -improved with diuresis  CKD stage IV Monitor Cr daily Follows with Dr. Moshe Cipro-- spoke with her  and she will get patient a close follow up (have adjusted lasix to BID)  Glaucoma Continue Xalatan  Low thyroid Continue Synthroid 50 mcg -TSH (normal)  Reflux Continue PPI but once daily as last d/c summary recommended  Hyperlipidemia Continue Zocor  Poor nutrition Continue Glucerna shakes     Medical Consultants:    None.   Discharge Exam:   Vitals:   02/01/18 2339 02/02/18 0830  BP: (!) 157/50 (!) 152/61  Pulse: 64   Resp: 20 18  Temp: 98.8 F (37.1 C)   SpO2: 97%    Vitals:   02/01/18 1146 02/01/18 1750 02/01/18 2339 02/02/18 0830  BP: (!) 164/62 (!) 156/60 (!) 157/50 (!) 152/61  Pulse:  60 64   Resp:   20 18  Temp:   98.8 F (37.1 C)   TempSrc:   Oral   SpO2: 100% 98% 97%   Weight:      Height:        Gen:  NAD    The results of significant diagnostics from this hospitalization (including imaging, microbiology, ancillary and laboratory) are listed below for reference.     Procedures and Diagnostic Studies:   Dg Chest 2 View  Result Date: 01/31/2018 CLINICAL DATA:  Shortness of breath. EXAM: CHEST - 2 VIEW COMPARISON:  July 24, 2017 FINDINGS: No pneumothorax. Cardiomegaly. The hila and mediastinum are unchanged. Mild increased interstitial markings in the lungs suggest pulmonary venous congestion/mild edema versus bronchitic change. A nodular density over the lateral right base may represent a nipple shadow. No other acute abnormalities. IMPRESSION: 1. Cardiomegaly and mild edema/pulmonary  venous congestion. 2. A nodular density over the lateral right lung base may represent a nipple shadow. Recommend repeat imaging upon resolution of symptoms with nipple markers to ensure resolution. Electronically Signed   By: Dorise Bullion III M.D   On: 01/31/2018 09:55     Labs:   Basic Metabolic Panel: Recent Labs  Lab 01/31/18 0837 01/31/18 2007 02/01/18 0304 02/02/18 0315  NA 142  --  143 142  K 3.6  --  3.5 3.4*  CL 108  --  109 105    CO2 23  --  25 26  GLUCOSE 196*  --  112* 93  BUN 42*  --  44* 49*  CREATININE 3.64* 3.77* 3.61* 3.89*  CALCIUM 8.9  --  8.9 8.9   GFR Estimated Creatinine Clearance: 9.4 mL/min (A) (by C-G formula based on SCr of 3.89 mg/dL (H)). Liver Function Tests: No results for input(s): AST, ALT, ALKPHOS, BILITOT, PROT, ALBUMIN in the last 168 hours. No results for input(s): LIPASE, AMYLASE in the last 168 hours. No results for input(s): AMMONIA in the last 168 hours. Coagulation profile No results for input(s): INR, PROTIME in the last 168 hours.  CBC: Recent Labs  Lab 01/28/18 0952 01/31/18 0837 01/31/18 2007 02/01/18 0304 02/02/18 0315  WBC  --  6.5 5.9 5.9 4.9  HGB 8.9* 9.7* 9.2* 9.2* 9.1*  HCT  --  31.9* 30.0* 30.3* 29.8*  MCV  --  88.4 88.2 87.1 87.4  PLT  --  237 242 238 243   Cardiac Enzymes: Recent Labs  Lab 01/31/18 2007  TROPONINI 0.04*   BNP: Invalid input(s): POCBNP CBG: Recent Labs  Lab 02/01/18 1240 02/01/18 1700 02/01/18 2136 02/02/18 0802 02/02/18 1132  GLUCAP 102* 103* 90 86 188*   D-Dimer No results for input(s): DDIMER in the last 72 hours. Hgb A1c No results for input(s): HGBA1C in the last 72 hours. Lipid Profile No results for input(s): CHOL, HDL, LDLCALC, TRIG, CHOLHDL, LDLDIRECT in the last 72 hours. Thyroid function studies Recent Labs    02/02/18 0315  TSH 1.416   Anemia work up No results for input(s): VITAMINB12, FOLATE, FERRITIN, TIBC, IRON, RETICCTPCT in the last 72 hours. Microbiology No results found for this or any previous visit (from the past 240 hour(s)).   Discharge Instructions:   Discharge Instructions    (HEART FAILURE PATIENTS) Call MD:  Anytime you have any of the following symptoms: 1) 3 pound weight gain in 24 hours or 5 pounds in 1 week 2) shortness of breath, with or without a dry hacking cough 3) swelling in the hands, feet or stomach 4) if you have to sleep on extra pillows at night in order to breathe.    Complete by:  As directed    Diet - low sodium heart healthy   Complete by:  As directed    Diet Carb Modified   Complete by:  As directed    Discharge instructions   Complete by:  As directed    Will need BMP in 1 week to monitor renal function-- please weight self daily Low salt diet Needs close follow up with Dr. Cathie Olden as well as Dr. Clover Mealy   Increase activity slowly   Complete by:  As directed      Allergies as of 02/02/2018   No Known Allergies     Medication List    TAKE these medications   acetaminophen 325 MG tablet Commonly known as:  TYLENOL Take 650 mg by mouth  every 6 (six) hours as needed for mild pain.   amLODipine 10 MG tablet Commonly known as:  NORVASC Take 10 mg by mouth daily.   aspirin 81 MG chewable tablet Chew 81 mg by mouth daily.   CALTRATE 600+D 600-800 MG-UNIT Tabs Generic drug:  Calcium Carb-Cholecalciferol Take 1 tablet by mouth daily.   carvedilol 3.125 MG tablet Commonly known as:  COREG Take 1 tablet (3.125 mg total) by mouth 2 (two) times daily with a meal.   CENTRUM SILVER PO Take 1 tablet by mouth daily. Dose Unknown   dorzolamide-timolol 22.3-6.8 MG/ML ophthalmic solution Commonly known as:  COSOPT Place 1 drop into both eyes 2 (two) times daily.   feeding supplement (GLUCERNA SHAKE) Liqd Take 237 mLs by mouth 2 (two) times daily between meals.   furosemide 20 MG tablet Commonly known as:  LASIX Take 1 tablet (20 mg total) by mouth 2 (two) times daily. What changed:  when to take this   glipiZIDE 5 MG tablet Commonly known as:  GLUCOTROL Take 2.5 mg by mouth daily before breakfast.   isosorbide mononitrate 30 MG 24 hr tablet Commonly known as:  IMDUR Take 1 tablet (30 mg total) by mouth daily.   latanoprost 0.005 % ophthalmic solution Commonly known as:  XALATAN Place 1 drop into both eyes at bedtime.   levothyroxine 50 MCG tablet Commonly known as:  SYNTHROID, LEVOTHROID Take 50 mcg by mouth daily.     pantoprazole 40 MG tablet Commonly known as:  PROTONIX Take 1 tablet (40 mg total) by mouth daily. What changed:  when to take this   potassium chloride 10 MEQ tablet Commonly known as:  K-DUR Take 1 tablet (10 mEq total) by mouth daily.   senna-docusate 8.6-50 MG tablet Commonly known as:  Senokot-S Take 1 tablet by mouth at bedtime. What changed:    when to take this  reasons to take this   simvastatin 20 MG tablet Commonly known as:  ZOCOR Take 20 mg by mouth every evening.      Follow-up Information    [name], MD.   Specialty:  Internal Medicine Contact information: [address], [place] [address] [phone]        Corliss Parish, MD Follow up.   Specialty:  Nephrology Why:  office will move up your April 29th visit to a sooner appointment Contact information: [place] [address] [phone]        Nahser, Wonda Cheng, MD Follow up in 1 month(s).   Specialty:  Cardiology Contact information: [place] [address] [phone]            Time coordinating discharge: 35 min  Signed:  Geradine Girt   [facility] 02/02/2018, 1:15 PM

## 2018-02-02 NOTE — Progress Notes (Signed)
Patient given discharge paper work. Son at bedside. All questions answered and pages reviewed. RA at discharge with ambulation 96%.   English as a second language teacher

## 2018-02-06 NOTE — Progress Notes (Signed)
Reports painful nails that she cannot care for herself  Subjective:  Patient ID: Mandy Larson, female    DOB: [birth year],  MRN: [id number]  No chief complaint on file.  84 y.o. female returns for the above complaint.  Reports painful nails that she cannot care for herself  Objective:  There were no vitals filed for this visit. General AA&O x3. Normal mood and affect.  Vascular Pedal pulses palpable.  Neurologic Epicritic sensation grossly intact.  Dermatologic No open lesions. Skin normal texture and turgor. Toenails x 10 elongated, thickened, dystrophic.  Orthopedic: Pain to palpation about the toenails.   Assessment & Plan:  Patient was evaluated and treated and all questions answered.  Onychomycosis with pain  -Nails palliatively debrided as below. -Educated on self-care  Procedure: Nail Debridement Rationale: pain  Type of Debridement: manual, sharp debridement. Instrumentation: Nail nipper, rotary burr. Number of Nails: 10     Return for Nail care.

## 2018-02-09 DIAGNOSIS — I5020 Unspecified systolic (congestive) heart failure: Secondary | ICD-10-CM | POA: Diagnosis not present

## 2018-02-10 ENCOUNTER — Ambulatory Visit: Payer: Medicare HMO | Admitting: Cardiovascular Disease

## 2018-02-10 ENCOUNTER — Encounter: Payer: Self-pay | Admitting: Cardiovascular Disease

## 2018-02-10 VITALS — BP 144/64 | HR 64 | Ht 63.0 in | Wt 138.1 lb

## 2018-02-10 DIAGNOSIS — I5042 Chronic combined systolic (congestive) and diastolic (congestive) heart failure: Secondary | ICD-10-CM | POA: Diagnosis not present

## 2018-02-10 LAB — BASIC METABOLIC PANEL
BUN / CREAT RATIO: 14 (ref 12–28)
BUN: 59 mg/dL — ABNORMAL HIGH (ref 8–27)
CHLORIDE: 101 mmol/L (ref 96–106)
CO2: 25 mmol/L (ref 20–29)
Calcium: 9.6 mg/dL (ref 8.7–10.3)
Creatinine, Ser: 4.25 mg/dL — ABNORMAL HIGH (ref 0.57–1.00)
GFR calc non Af Amer: 9 mL/min/{1.73_m2} — ABNORMAL LOW (ref >59)
GFR, EST AFRICAN AMERICAN: 10 mL/min/{1.73_m2} — AB (ref >59)
Glucose: 136 mg/dL — ABNORMAL HIGH (ref 65–99)
Potassium: 4.5 mmol/L (ref 3.5–5.2)
SODIUM: 140 mmol/L (ref 134–144)

## 2018-02-10 NOTE — Patient Instructions (Signed)
Medication Instructions:  Your physician recommends that you continue on your current medications as directed. Please refer to the Current Medication list given to you today.   Labwork: TODAY - basic metabolic panel   Testing/Procedures: None Ordered   Follow-Up: Your physician wants you to follow-up in: 6 months with one of the PAs or Nurse Practitioners on Dr. Elmarie Shiley team. You will receive a reminder letter in the mail two months in advance. If you don't receive a letter, please call our office to schedule the follow-up appointment.   If you need a refill on your cardiac medications before your next appointment, please call your pharmacy.   Thank you for choosing [facility]! Christen Bame, RN [phone]

## 2018-02-10 NOTE — Progress Notes (Signed)
Mandy Larson Date of Birth  Apr 15, 1930       [facility]    [employer] [address], [place], [place]  [address]   [place],   [address] [phone]     [phone]   Fax  [phone]    Fax [phone]  Problem List: 1. Coronary artery disease-status post myocardial infarction/ACS 2. Acute on Chronic Systolic CHF -   Left ventricle:  mildly dilated, EF 35% to 40%. akinesis of the entire inferior and inferoseptal myocardium. Grade 2  diastolic dysfunction. - Mitral valve: Calcified annulus. There was likely dysfunction/tethering of the posterior papillary muscle as a result of inferior infarct. Severe regurgitation directed centrally and posteriorly. - Right ventricle: The cavity size was mildly dilated. Wall thickness was normal. - Pulmonic valve: Moderate regurgitation. - Pulmonary arteries: Systolic pressure was moderately increased. PA peak pressure: 54m Hg (S). 3. Hypertension 4. Mitral regurgitation-severe-likely due to  inferior wall myocardial infarction 5. Type 2 diabetes mellitus 6.  Hyperlipidemia 7. Chronic kidney disease - baseline Creatinine = 2.0-2.5    Mandy Larson an 84yo female who was admitted to the hospital in D2013-12-31  She was originally seen in consultation by Mandy Larson She presented with an acute coronary syndrome. She was found to have mildly positive cardiac enzymes. An echocardiogram revealed inferior akinesis. She has an ejection fraction of around 35%. She has severe mitral regurgitation. She also has moderate pulmonary hypertension.  We did not perform a cardiac catheterization because of her chronic kidney disease. She did very well in that local therapy and was discharged in satisfactory condition. She's done very well on medical therapy. She's not had any further episodes of  chest pain or shortness of breath.  February 28, 2013: She gets short of breath with climbing stairs  and other exertion.   She is not avoiding salt   May 23, 2013: Mandy Larson doing better - trying to avoid in the extra salt.  Feb. 12, 2015:  Mandy Larson.  She has been able to do all of her normal activities without much difficulty.  No CP , no dyspnea.  She walks around in her 3 story house.  She is taking care of her sick husband which takes up most of her time.  She's able to go grocery shopping without too much difficulty.  June 26, 2014: No complaints. No real exercise, busy keeping house   Aug. 22, 2016: Doing well, stays busy. Her husband passed away in DDec 31, 2024  No CP or dyspnea.   Aug. 28, 2017  Doing ok. No CP Has some DOE when she overdoes it. ( climbing stairs)  No syncope or presyncope Her last BMP showed a creatinine of 2.9.   Will repeat  February 10, 2018: Mandy Larson is seen today for follow up of her chronic combined systolic and diastolic congestive heart failure, mitral regurgitation, chronic kidney disease. She was hospitalized last week with CHF. Creatinine was 3.89 on DC from the hospital   Lasix was increased ,  kdur was added. She is breathing better now  Goes to the hospital for an injection ( presume  Epo injections)  tomorrow    Current Outpatient Medications on File Prior to Visit  Medication Sig Dispense Refill  . acetaminophen (TYLENOL) 325 MG tablet Take 650 mg by mouth every 6 (six) hours as needed for mild pain.    .Marland KitchenamLODipine (NORVASC) 10 MG  tablet Take 10 mg by mouth daily.  4  . aspirin 81 MG chewable tablet Chew 81 mg by mouth daily.    . Calcium Carb-Cholecalciferol (CALTRATE 600+D) 600-800 MG-UNIT TABS Take 1 tablet by mouth daily.    . carvedilol (COREG) 3.125 MG tablet Take 1 tablet (3.125 mg total) by mouth 2 (two) times daily with a meal. 180 tablet 3  . dorzolamide-timolol (COSOPT) 22.3-6.8 MG/ML ophthalmic solution Place 1 drop into both eyes 2 (two) times daily.     . feeding supplement, GLUCERNA SHAKE, (GLUCERNA SHAKE)  LIQD Take 237 mLs by mouth 2 (two) times daily between meals.  0  . furosemide (LASIX) 20 MG tablet Take 1 tablet (20 mg total) by mouth 2 (two) times daily. 60 tablet 0  . glipiZIDE (GLUCOTROL) 5 MG tablet Take 2.5 mg by mouth daily before breakfast.     . isosorbide mononitrate (IMDUR) 30 MG 24 hr tablet Take 1 tablet (30 mg total) by mouth daily. 30 tablet 5  . latanoprost (XALATAN) 0.005 % ophthalmic solution Place 1 drop into both eyes at bedtime.     Marland Kitchen levothyroxine (SYNTHROID, LEVOTHROID) 50 MCG tablet Take 50 mcg by mouth daily.    . Multiple Vitamins-Minerals (CENTRUM SILVER PO) Take 1 tablet by mouth daily. Dose Unknown    . pantoprazole (PROTONIX) 40 MG tablet Take 1 tablet (40 mg total) by mouth daily. 60 tablet 0  . potassium chloride (K-DUR) 10 MEQ tablet Take 1 tablet (10 mEq total) by mouth daily. 30 tablet 0  . senna-docusate (SENOKOT-S) 8.6-50 MG tablet Take 1 tablet by mouth at bedtime. (Patient taking differently: Take 1 tablet by mouth at bedtime as needed for mild constipation. ) 30 tablet 0  . simvastatin (ZOCOR) 20 MG tablet Take 20 mg by mouth every evening.     No current facility-administered medications on file prior to visit.     No Known Allergies  Past Medical History:  Diagnosis Date  . Anemia   . CAD (coronary artery disease) 06/26/2014   admx with NSTEMI in 12/13 >> tx medically due to CKD  . Chronic combined systolic and diastolic CHF (congestive heart failure) ([place]) 11/08/2012   Echo 12/13:  EF 35-40, inferior and inferoseptal akinesis, grade 2 diastolic dysfunction, MAC, dysfunction/tethering of the posterior papillary muscle as a result of inferior infarct, severe MR, moderate PI, PASP 50, small circumferential pericardial effusion without hemodynamic compromise  . CKD (chronic kidney disease)   . Diabetes mellitus without complication ([place])   . Former smoker   . Hyperlipidemia   . Hypertension   . Pulmonary hypertension ([place]) 11/08/2012  . Severe  mitral regurgitation 11/08/2012   Due to papillary muscle dysfunction from prior inf infarct    Past Surgical History:  Procedure Laterality Date  . ESOPHAGOGASTRODUODENOSCOPY N/A 07/25/2017   Procedure: ESOPHAGOGASTRODUODENOSCOPY (EGD);  Surgeon: [name], MD;  Location: [facility];  Service: Gastroenterology;  Laterality: N/A;  . ESOPHAGOGASTRODUODENOSCOPY N/A 12/02/2017   Procedure: ESOPHAGOGASTRODUODENOSCOPY (EGD);  Surgeon: Ladene Artist, MD;  Location: [facility] ENDOSCOPY;  Service: Endoscopy;  Laterality: N/A;  . EYE SURGERY      Social History   Tobacco Use  Smoking Status Former Smoker  . Types: Cigarettes  . Last attempt to quit: 11/09/1999  . Years since quitting: 18.2  Smokeless Tobacco Never Used    Social History   Substance and Sexual Activity  Alcohol Use No  . Alcohol/week: 0.0 oz    Family History  Problem Relation Age  of Onset  . Hypertension Father     Reviw of Systems:  Reviewed in the HPI.  All other systems are negative.  Physical Exam: Blood pressure (!) 144/64, pulse 64, height _0  (1.6 m), weight 138 lb 1.9 oz (62.7 kg), SpO2 97 %.  GEN: elderly female,   Frail,   HEENT: Normal NECK: No JVD; No carotid bruits LYMPHATICS: No lymphadenopathy CARDIAC: RRR, very soft systolic murmur  RESPIRATORY:  Clear to auscultation without rales, wheezing or rhonchi  ABDOMEN: Soft, non-tender, non-distended MUSCULOSKELETAL:  No edema; No deformity  SKIN: Warm and dry NEUROLOGIC:  Alert and oriented x 3   ECG:   Assessment / Plan:   1. Coronary artery disease-status post myocardial infarction/ACS 2. Chronic  Combined Systolic adn diastolic CHF -   Echo on February 01, 2018 reveals LVEF of 40-45%.  Akinesis of the inferior lateral wall.  Moderate mitral regurgitation, moderate pulmonary artery hypertension with estimated PA pressure of 52.  Lasix was doubled.   She is feeling better.   Will need to keep a close eye on her creatinine.   She sees Dr.  Moshe Cipro.   3. Hypertension:     BP is fairly well controlled    4. Mitral regurgitation- moderate by last echo  5. Type 2 diabetes mellitus 6.  Hyperlipidemia -   7. Chronic kidney disease -   8. Sinus brady:         Mertie Moores, MD  02/10/2018 8:05 AM    [facility] [place],  [place], [place]  [address] Pager [phone] Phone: [phone]; Fax: [phone]

## 2018-02-11 ENCOUNTER — Ambulatory Visit (HOSPITAL_COMMUNITY)
Admission: RE | Admit: 2018-02-11 | Discharge: 2018-02-11 | Disposition: A | Discharge status: Home / Self Care | Payer: Medicare HMO | Source: Ambulatory Visit | Attending: Nephrology | Admitting: Nephrology

## 2018-02-11 VITALS — BP 146/66 | HR 57 | Temp 97.9°F | Resp 20

## 2018-02-11 DIAGNOSIS — D631 Anemia in chronic kidney disease: Secondary | ICD-10-CM | POA: Diagnosis not present

## 2018-02-11 DIAGNOSIS — N184 Chronic kidney disease, stage 4 (severe): Secondary | ICD-10-CM

## 2018-02-11 LAB — RENAL FUNCTION PANEL
Albumin: 3.7 g/dL (ref 3.5–5.0)
Anion gap: 12 (ref 5–15)
BUN: 64 mg/dL — AB (ref 6–20)
CO2: 25 mmol/L (ref 22–32)
Calcium: 9.6 mg/dL (ref 8.9–10.3)
Chloride: 102 mmol/L (ref 101–111)
Creatinine, Ser: 4.13 mg/dL — ABNORMAL HIGH (ref 0.44–1.00)
GFR calc Af Amer: 10 mL/min — ABNORMAL LOW (ref >60)
GFR, EST NON AFRICAN AMERICAN: 9 mL/min — AB (ref >60)
GLUCOSE: 140 mg/dL — AB (ref 65–99)
POTASSIUM: 4.5 mmol/L (ref 3.5–5.1)
Phosphorus: 5.5 mg/dL — ABNORMAL HIGH (ref 2.5–4.6)
SODIUM: 139 mmol/L (ref 135–145)

## 2018-02-11 LAB — POCT HEMOGLOBIN-HEMACUE: Hemoglobin: 10.9 g/dL — ABNORMAL LOW (ref 12.0–15.0)

## 2018-02-11 LAB — IRON AND TIBC
Iron: 74 ug/dL (ref 28–170)
SATURATION RATIOS: 29 % (ref 10.4–31.8)
TIBC: 256 ug/dL (ref 250–450)
UIBC: 182 ug/dL

## 2018-02-11 LAB — FERRITIN: Ferritin: 893 ng/mL — ABNORMAL HIGH (ref 11–307)

## 2018-02-11 MED ORDER — EPOETIN ALFA 20000 UNIT/ML IJ SOLN
INTRAMUSCULAR | Status: AC
  Filled 2018-02-11: qty 1

## 2018-02-11 MED ORDER — EPOETIN ALFA 20000 UNIT/ML IJ SOLN
20000.0000 [IU] | INTRAMUSCULAR | Status: DC
  Administered 2018-02-11: 20000 [IU] via SUBCUTANEOUS

## 2018-02-11 NOTE — Discharge Instructions (Signed)
Epoetin Alfa injection °What is this medicine? °EPOETIN ALFA (e POE e tin AL fa) helps your body make more red blood cells. This medicine is used to treat anemia caused by chronic kidney failure, cancer chemotherapy, or HIV-therapy. It may also be used before surgery if you have anemia. °This medicine may be used for other purposes; ask your health care provider or pharmacist if you have questions. °COMMON BRAND NAME(S): Epogen, Procrit °What should I tell my health care provider before I take this medicine? °They need to know if you have any of these conditions: °-blood clotting disorders °-cancer patient not on chemotherapy °-cystic fibrosis °-heart disease, such as angina or heart failure °-hemoglobin level of 12 g/dL or greater °-high blood pressure °-low levels of folate, iron, or vitamin B12 °-seizures °-an unusual or allergic reaction to erythropoietin, albumin, benzyl alcohol, hamster proteins, other medicines, foods, dyes, or preservatives °-pregnant or trying to get pregnant °-breast-feeding °How should I use this medicine? °This medicine is for injection into a vein or under the skin. It is usually given by a health care professional in a hospital or clinic setting. °If you get this medicine at home, you will be taught how to prepare and give this medicine. Use exactly as directed. Take your medicine at regular intervals. Do not take your medicine more often than directed. °It is important that you put your used needles and syringes in a special sharps container. Do not put them in a trash can. If you do not have a sharps container, call your pharmacist or healthcare provider to get one. °A special MedGuide will be given to you by the pharmacist with each prescription and refill. Be sure to read this information carefully each time. °Talk to your pediatrician regarding the use of this medicine in children. While this drug may be prescribed for selected conditions, precautions do apply. °Overdosage: If you  think you have taken too much of this medicine contact a poison control center or emergency room at once. °NOTE: This medicine is only for you. Do not share this medicine with others. °What if I miss a dose? °If you miss a dose, take it as soon as you can. If it is almost time for your next dose, take only that dose. Do not take double or extra doses. °What may interact with this medicine? °Do not take this medicine with any of the following medications: °-darbepoetin alfa °This list may not describe all possible interactions. Give your health care provider a list of all the medicines, herbs, non-prescription drugs, or dietary supplements you use. Also tell them if you smoke, drink alcohol, or use illegal drugs. Some items may interact with your medicine. °What should I watch for while using this medicine? °Your condition will be monitored carefully while you are receiving this medicine. °You may need blood work done while you are taking this medicine. °What side effects may I notice from receiving this medicine? °Side effects that you should report to your doctor or health care professional as soon as possible: °-allergic reactions like skin rash, itching or hives, swelling of the face, lips, or tongue °-breathing problems °-changes in vision °-chest pain °-confusion, trouble speaking or understanding °-feeling faint or lightheaded, falls °-high blood pressure °-muscle aches or pains °-pain, swelling, warmth in the leg °-rapid weight gain °-severe headaches °-sudden numbness or weakness of the face, arm or leg °-trouble walking, dizziness, loss of balance or coordination °-seizures (convulsions) °-swelling of the ankles, feet, hands °-unusually weak or tired °  Side effects that usually do not require medical attention (report to your doctor or health care professional if they continue or are bothersome): °-diarrhea °-fever, chills (flu-like symptoms) °-headaches °-nausea, vomiting °-redness, stinging, or swelling at  site where injected °This list may not describe all possible side effects. Call your doctor for medical advice about side effects. You may report side effects to FDA at 1-800-FDA-1088. °Where should I keep my medicine? °Keep out of the reach of children. °Store in a refrigerator between 2 and 8 degrees C (36 and 46 degrees F). Do not freeze or shake. Throw away any unused portion if using a single-dose vial. Multi-dose vials can be kept in the refrigerator for up to 21 days after the initial dose. Throw away unused medicine. °NOTE: This sheet is a summary. It may not cover all possible information. If you have questions about this medicine, talk to your doctor, pharmacist, or health care provider. °© 2018 [employer] (2016-06-16 19:42:31) ° °Ferumoxytol injection °What is this medicine? °FERUMOXYTOL is an iron complex. Iron is used to make healthy red blood cells, which carry oxygen and nutrients throughout the body. This medicine is used to treat iron deficiency anemia in people with chronic kidney disease. °This medicine may be used for other purposes; ask your health care provider or pharmacist if you have questions. °COMMON BRAND NAME(S): Feraheme °What should I tell my health care provider before I take this medicine? °They need to know if you have any of these conditions: °-anemia not caused by low iron levels °-high levels of iron in the blood °-magnetic resonance imaging (MRI) test scheduled °-an unusual or allergic reaction to iron, other medicines, foods, dyes, or preservatives °-pregnant or trying to get pregnant °-breast-feeding °How should I use this medicine? °This medicine is for injection into a vein. It is given by a health care professional in a hospital or clinic setting. °Talk to your pediatrician regarding the use of this medicine in children. Special care may be needed. °Overdosage: If you think you have taken too much of this medicine contact a poison control center or emergency  room at once. °NOTE: This medicine is only for you. Do not share this medicine with others. °What if I miss a dose? °It is important not to miss your dose. Call your doctor or health care professional if you are unable to keep an appointment. °What may interact with this medicine? °This medicine may interact with the following medications: °-other iron products °This list may not describe all possible interactions. Give your health care provider a list of all the medicines, herbs, non-prescription drugs, or dietary supplements you use. Also tell them if you smoke, drink alcohol, or use illegal drugs. Some items may interact with your medicine. °What should I watch for while using this medicine? °Visit your doctor or healthcare professional regularly. Tell your doctor or healthcare professional if your symptoms do not start to get better or if they get worse. You may need blood work done while you are taking this medicine. °You may need to follow a special diet. Talk to your doctor. Foods that contain iron include: whole grains/cereals, dried fruits, beans, or peas, leafy green vegetables, and organ meats (liver, kidney). °What side effects may I notice from receiving this medicine? °Side effects that you should report to your doctor or health care professional as soon as possible: °-allergic reactions like skin rash, itching or hives, swelling of the face, lips, or tongue °-breathing problems °-changes in blood pressure °-feeling   faint or lightheaded, falls °-fever or chills °-flushing, sweating, or hot feelings °-swelling of the ankles or feet °Side effects that usually do not require medical attention (report to your doctor or health care professional if they continue or are bothersome): °-diarrhea °-headache °-nausea, vomiting °-stomach pain °This list may not describe all possible side effects. Call your doctor for medical advice about side effects. You may report side effects to FDA at 1-800-FDA-1088. °Where  should I keep my medicine? °This drug is given in a hospital or clinic and will not be stored at home. °NOTE: This sheet is a summary. It may not cover all possible information. If you have questions about this medicine, talk to your doctor, pharmacist, or health care provider. °© 2018 [employer] (2015-11-29 12:41:49) ° ° ° °

## 2018-02-12 LAB — PTH, INTACT AND CALCIUM
CALCIUM TOTAL (PTH): 9.5 mg/dL (ref 8.7–10.3)
PTH: 44 pg/mL (ref 15–65)

## 2018-02-23 DIAGNOSIS — D631 Anemia in chronic kidney disease: Secondary | ICD-10-CM | POA: Diagnosis not present

## 2018-02-23 DIAGNOSIS — I110 Hypertensive heart disease with heart failure: Secondary | ICD-10-CM | POA: Diagnosis not present

## 2018-02-23 DIAGNOSIS — N185 Chronic kidney disease, stage 5: Secondary | ICD-10-CM | POA: Diagnosis not present

## 2018-02-23 DIAGNOSIS — I509 Heart failure, unspecified: Secondary | ICD-10-CM | POA: Diagnosis not present

## 2018-02-25 ENCOUNTER — Encounter (HOSPITAL_COMMUNITY)
Admission: RE | Admit: 2018-02-25 | Discharge: 2018-02-25 | Disposition: A | Discharge status: Home / Self Care | Payer: Medicare HMO | Source: Ambulatory Visit | Attending: Nephrology | Admitting: Nephrology

## 2018-02-25 VITALS — BP 148/56 | HR 55 | Resp 18

## 2018-02-25 DIAGNOSIS — D631 Anemia in chronic kidney disease: Secondary | ICD-10-CM | POA: Diagnosis not present

## 2018-02-25 DIAGNOSIS — N184 Chronic kidney disease, stage 4 (severe): Secondary | ICD-10-CM | POA: Diagnosis present

## 2018-02-25 LAB — POCT HEMOGLOBIN-HEMACUE: HEMOGLOBIN: 11.7 g/dL — AB (ref 12.0–15.0)

## 2018-02-25 MED ORDER — EPOETIN ALFA 20000 UNIT/ML IJ SOLN
INTRAMUSCULAR | Status: AC
  Filled 2018-02-25: qty 1

## 2018-02-25 MED ORDER — EPOETIN ALFA 20000 UNIT/ML IJ SOLN
20000.0000 [IU] | INTRAMUSCULAR | Status: DC
  Administered 2018-02-25: 20000 [IU] via SUBCUTANEOUS

## 2018-03-08 DIAGNOSIS — D631 Anemia in chronic kidney disease: Secondary | ICD-10-CM | POA: Diagnosis not present

## 2018-03-08 DIAGNOSIS — I110 Hypertensive heart disease with heart failure: Secondary | ICD-10-CM | POA: Diagnosis not present

## 2018-03-08 DIAGNOSIS — N184 Chronic kidney disease, stage 4 (severe): Secondary | ICD-10-CM | POA: Diagnosis not present

## 2018-03-08 DIAGNOSIS — I509 Heart failure, unspecified: Secondary | ICD-10-CM | POA: Diagnosis not present

## 2018-03-11 ENCOUNTER — Encounter (HOSPITAL_COMMUNITY): Payer: Medicare HMO

## 2018-03-24 ENCOUNTER — Other Ambulatory Visit (HOSPITAL_COMMUNITY): Payer: Self-pay | Admitting: *Deleted

## 2018-03-25 ENCOUNTER — Ambulatory Visit (HOSPITAL_COMMUNITY)
Admission: RE | Admit: 2018-03-25 | Discharge: 2018-03-25 | Disposition: A | Discharge status: Home / Self Care | Payer: Medicare HMO | Source: Ambulatory Visit | Attending: Nephrology | Admitting: Nephrology

## 2018-03-25 VITALS — BP 143/66 | HR 68 | Resp 66

## 2018-03-25 DIAGNOSIS — N184 Chronic kidney disease, stage 4 (severe): Secondary | ICD-10-CM

## 2018-03-25 DIAGNOSIS — D631 Anemia in chronic kidney disease: Secondary | ICD-10-CM | POA: Diagnosis not present

## 2018-03-25 LAB — RENAL FUNCTION PANEL
ANION GAP: 10 (ref 5–15)
Albumin: 3.8 g/dL (ref 3.5–5.0)
BUN: 78 mg/dL — AB (ref 6–20)
CHLORIDE: 104 mmol/L (ref 101–111)
CO2: 25 mmol/L (ref 22–32)
Calcium: 9.4 mg/dL (ref 8.9–10.3)
Creatinine, Ser: 4.39 mg/dL — ABNORMAL HIGH (ref 0.44–1.00)
GFR, EST AFRICAN AMERICAN: 10 mL/min — AB (ref >60)
GFR, EST NON AFRICAN AMERICAN: 8 mL/min — AB (ref >60)
Glucose, Bld: 146 mg/dL — ABNORMAL HIGH (ref 65–99)
POTASSIUM: 4.1 mmol/L (ref 3.5–5.1)
Phosphorus: 5.4 mg/dL — ABNORMAL HIGH (ref 2.5–4.6)
Sodium: 139 mmol/L (ref 135–145)

## 2018-03-25 LAB — IRON AND TIBC
IRON: 109 ug/dL (ref 28–170)
SATURATION RATIOS: 40 % — AB (ref 10.4–31.8)
TIBC: 270 ug/dL (ref 250–450)
UIBC: 161 ug/dL

## 2018-03-25 LAB — POCT HEMOGLOBIN-HEMACUE: Hemoglobin: 12.4 g/dL (ref 12.0–15.0)

## 2018-03-25 LAB — FERRITIN: Ferritin: 589 ng/mL — ABNORMAL HIGH (ref 11–307)

## 2018-03-25 MED ORDER — EPOETIN ALFA 20000 UNIT/ML IJ SOLN: 20000.0000 [IU] | INTRAMUSCULAR | Status: DC

## 2018-03-26 LAB — PTH, INTACT AND CALCIUM
Calcium, Total (PTH): 9.6 mg/dL (ref 8.7–10.3)
PTH: 46 pg/mL (ref 15–65)

## 2018-03-31 DIAGNOSIS — I509 Heart failure, unspecified: Secondary | ICD-10-CM | POA: Diagnosis not present

## 2018-03-31 DIAGNOSIS — I25119 Atherosclerotic heart disease of native coronary artery with unspecified angina pectoris: Secondary | ICD-10-CM | POA: Diagnosis not present

## 2018-03-31 DIAGNOSIS — I252 Old myocardial infarction: Secondary | ICD-10-CM | POA: Diagnosis not present

## 2018-03-31 DIAGNOSIS — E039 Hypothyroidism, unspecified: Secondary | ICD-10-CM | POA: Diagnosis not present

## 2018-03-31 DIAGNOSIS — H409 Unspecified glaucoma: Secondary | ICD-10-CM | POA: Diagnosis not present

## 2018-03-31 DIAGNOSIS — E785 Hyperlipidemia, unspecified: Secondary | ICD-10-CM | POA: Diagnosis not present

## 2018-03-31 DIAGNOSIS — I110 Hypertensive heart disease with heart failure: Secondary | ICD-10-CM | POA: Diagnosis not present

## 2018-03-31 DIAGNOSIS — K08109 Complete loss of teeth, unspecified cause, unspecified class: Secondary | ICD-10-CM | POA: Diagnosis not present

## 2018-03-31 DIAGNOSIS — E119 Type 2 diabetes mellitus without complications: Secondary | ICD-10-CM | POA: Diagnosis not present

## 2018-03-31 DIAGNOSIS — Z7982 Long term (current) use of aspirin: Secondary | ICD-10-CM | POA: Diagnosis not present

## 2018-04-06 DIAGNOSIS — E1165 Type 2 diabetes mellitus with hyperglycemia: Secondary | ICD-10-CM | POA: Diagnosis not present

## 2018-04-07 DIAGNOSIS — Z1231 Encounter for screening mammogram for malignant neoplasm of breast: Secondary | ICD-10-CM | POA: Diagnosis not present

## 2018-04-07 DIAGNOSIS — E1165 Type 2 diabetes mellitus with hyperglycemia: Secondary | ICD-10-CM | POA: Diagnosis not present

## 2018-04-08 ENCOUNTER — Ambulatory Visit (HOSPITAL_COMMUNITY)
Admission: RE | Admit: 2018-04-08 | Discharge: 2018-04-08 | Disposition: A | Discharge status: Home / Self Care | Payer: Medicare HMO | Source: Ambulatory Visit | Attending: Nephrology | Admitting: Nephrology

## 2018-04-08 VITALS — BP 149/67 | HR 52 | Temp 97.8°F | Resp 20

## 2018-04-08 DIAGNOSIS — D631 Anemia in chronic kidney disease: Secondary | ICD-10-CM | POA: Insufficient documentation

## 2018-04-08 DIAGNOSIS — N184 Chronic kidney disease, stage 4 (severe): Secondary | ICD-10-CM

## 2018-04-08 LAB — POCT HEMOGLOBIN-HEMACUE: Hemoglobin: 11.4 g/dL — ABNORMAL LOW (ref 12.0–15.0)

## 2018-04-08 MED ORDER — EPOETIN ALFA 20000 UNIT/ML IJ SOLN
20000.0000 [IU] | INTRAMUSCULAR | Status: DC
  Administered 2018-04-08: 20000 [IU] via SUBCUTANEOUS

## 2018-04-08 MED ORDER — EPOETIN ALFA 20000 UNIT/ML IJ SOLN
INTRAMUSCULAR | Status: AC
  Filled 2018-04-08: qty 1

## 2018-04-09 DIAGNOSIS — R928 Other abnormal and inconclusive findings on diagnostic imaging of breast: Secondary | ICD-10-CM | POA: Diagnosis not present

## 2018-04-14 ENCOUNTER — Other Ambulatory Visit: Payer: Self-pay | Admitting: Radiology

## 2018-04-14 DIAGNOSIS — C50311 Malignant neoplasm of lower-inner quadrant of right female breast: Secondary | ICD-10-CM | POA: Diagnosis not present

## 2018-04-14 DIAGNOSIS — C50911 Malignant neoplasm of unspecified site of right female breast: Secondary | ICD-10-CM | POA: Diagnosis not present

## 2018-04-14 DIAGNOSIS — N6314 Unspecified lump in the right breast, lower inner quadrant: Secondary | ICD-10-CM | POA: Diagnosis not present

## 2018-04-14 DIAGNOSIS — D0591 Unspecified type of carcinoma in situ of right breast: Secondary | ICD-10-CM | POA: Diagnosis not present

## 2018-04-16 ENCOUNTER — Telehealth: Payer: Self-pay | Admitting: Oncology

## 2018-04-16 ENCOUNTER — Encounter: Payer: Self-pay | Admitting: *Deleted

## 2018-04-16 NOTE — Telephone Encounter (Signed)
Spoke to patient to confirm [facility] appointment for 6/12, letter mailed

## 2018-04-19 NOTE — Progress Notes (Signed)
[place]  [phone]) [phone] [phone]) [phone]     ID: Mandy Larson DOB: [birth year]  MR#: [id number]  JIR#:[id number]  Patient Care Team: [name], MD as PCP - General (Internal Medicine) Nahser, Wonda Cheng, MD as PCP - Cardiology (Cardiology) Excell Seltzer, MD as Consulting Physician (General Surgery) Kristian Mogg, Virgie Dad, MD as Consulting Physician (Oncology) Eppie Gibson, MD as Attending Physician (Radiation Oncology) Corliss Parish, MD as Consulting Physician (Nephrology) Evelina Bucy, DPM as Consulting Physician (Podiatry) Dorcas Carrow, Ritesh, OD (Optometry) OTHER MD:   CHIEF COMPLAINT: Estrogen receptor positive breast cancer  CURRENT TREATMENT: Awaiting definitive surgery   HISTORY OF CURRENT ILLNESS: Mandy Larson had routine screening mammography on 04/07/2018 showing a possible abnormality in the right breast. She underwent unilateral right diagnostic mammography with tomography at [facility] on 04/09/2018 showing: breast density category D. There is a group of indeterminate calcifications in the lower inner quadrant middle depth of the right breast, spanning 4.7 cm. Ultrasonography of the right breast and axilla on 06/05/ 2019 at [facility] revealed a 1.1 cm hypoechoic mass at the 5 o'clock position  Lower inner quadrant and located 4 cm from the nipple, with associated calcifications. There was no evidence of lymphadenopathy in the right axilla.   Accordingly on 04/14/2018 she proceeded to biopsy of the right breast area in question. The pathology from this procedure showed ([id number]): Invasive ductal carcinoma grade 2, measuring 0.7 cm in greatest extent. Ductal carcinoma in situ, intermediate grade with calcifications. Prognostic indicators significant for: estrogen receptor, 100% posiive and progesterone receptor, 80% positive, both with strong staining intensity. Proliferation marker Ki67 at 10%. HER2 not amplified with ratios HER2/CEP17  signals 1.11 and average HER2 copies per cell 2.00  The patient's subsequent history is as detailed below.  INTERVAL HISTORY: Mandy Larson was evaluated in the multidisciplinary breast cancer clinic on 04/21/2018. Her case was also presented at the multidisciplinary breast cancer conference on the same day. At that time a preliminary plan was proposed: Breast MRI, possible lumpectomy, no sentinel lymph node sampling, consider adjuvant radiation, antiestrogens, no chemotherapy planned   REVIEW OF SYSTEMS: There were no specific symptoms leading to the original mammogram, which was routinely scheduled. In a normal day, she watches tv, cleans, and cooks. She also drives and does her own grocery shopping. The patient denies unusual headaches, visual changes, nausea, vomiting, stiff neck, dizziness, or gait imbalance. There has been no cough, phlegm production, or pleurisy, no chest pain or pressure, and no change in bowel or bladder habits. The patient denies fever, rash, bleeding, unexplained fatigue or unexplained weight loss. A detailed review of systems was otherwise entirely negative.   PAST MEDICAL HISTORY: Past Medical History:  Diagnosis Date  . Anemia   . CAD (coronary artery disease) 06/26/2014   admx with NSTEMI in 12/13 >> tx medically due to CKD  . Chronic combined systolic and diastolic CHF (congestive heart failure) ([place]) 11/08/2012   Echo 12/13:  EF 35-40, inferior and inferoseptal akinesis, grade 2 diastolic dysfunction, MAC, dysfunction/tethering of the posterior papillary muscle as a result of inferior infarct, severe MR, moderate PI, PASP 50, small circumferential pericardial effusion without hemodynamic compromise  . CKD (chronic kidney disease)   . Diabetes mellitus without complication ([place])   . Former smoker   . Heart attack ([place])   . Hyperlipidemia   . Hypertension   . Malignant neoplasm of lower-inner quadrant of right breast of female, estrogen receptor positive ([place])  04/21/2018  . Pulmonary hypertension ([place]) 11/08/2012  .  Severe mitral regurgitation 11/08/2012   Due to papillary muscle dysfunction from prior inf infarct  MI 2013  PAST SURGICAL HISTORY: Past Surgical History:  Procedure Laterality Date  . ESOPHAGOGASTRODUODENOSCOPY N/A 07/25/2017   Procedure: ESOPHAGOGASTRODUODENOSCOPY (EGD);  Surgeon: [name], MD;  Location: [place];  Service: Gastroenterology;  Laterality: N/A;  . ESOPHAGOGASTRODUODENOSCOPY N/A 12/02/2017   Procedure: ESOPHAGOGASTRODUODENOSCOPY (EGD);  Surgeon: Ladene Artist, MD;  Location: [facility] ENDOSCOPY;  Service: Endoscopy;  Laterality: N/A;  . EYE SURGERY      FAMILY HISTORY Family History  Problem Relation Age of Onset  . Hypertension Father   The patient's father died at age 49 due to MI. The patient's mother died at age 32 also due to MI. The patient had 2 brothers and no sisters. She denies a family history of breast or ovarian cancer.   GYNECOLOGIC HISTORY:  No LMP recorded. Patient is postmenopausal. Menarche: 84 years old Age at first live birth: 84 years old She is GX P2. Her LMP was in her late 99's. She never used contraception or HRT.   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 She is widowed. At home is the patient's son, Harrell Gave, who is retired from working at [employer] and is in fair health. The patient's second son, Remo Lipps works for [employer] in the nursing department (is not a Marine scientist). The patient has 1 grandchild. The patient attends [employer].      ADVANCED DIRECTIVES: Not in Place.  At the 04/21/2018 visit the patient was given the appropriate documents to complete and notarized at her discretion   HEALTH MAINTENANCE: Social History   Tobacco Use  . Smoking status: Former Smoker    Types: Cigarettes    Last attempt to quit: 11/09/1999    Years since quitting: 18.4  . Smokeless tobacco: Never Used  Substance Use Topics  . Alcohol use: No      Alcohol/week: 0.0 oz  . Drug use: No     Colonoscopy: 12/02/2017/ Dr. Fuller Plan Small hiatal hernia, gastric ulcer  PAP: 2018  Bone density:   No Known Allergies  Current Outpatient Medications  Medication Sig Dispense Refill  . amLODipine (NORVASC) 10 MG tablet Take 10 mg by mouth daily.  4  . carvedilol (COREG) 3.125 MG tablet Take 1 tablet (3.125 mg total) by mouth 2 (two) times daily with a meal. 180 tablet 3  . dorzolamide-timolol (COSOPT) 22.3-6.8 MG/ML ophthalmic solution Place 1 drop into both eyes 2 (two) times daily.     . furosemide (LASIX) 20 MG tablet Take 1 tablet (20 mg total) by mouth 2 (two) times daily. 60 tablet 0  . glipiZIDE (GLUCOTROL) 5 MG tablet Take 2.5 mg by mouth daily before breakfast.     . isosorbide mononitrate (IMDUR) 30 MG 24 hr tablet Take 1 tablet (30 mg total) by mouth daily. 30 tablet 5  . latanoprost (XALATAN) 0.005 % ophthalmic solution Place 1 drop into both eyes at bedtime.     Marland Kitchen levothyroxine (SYNTHROID, LEVOTHROID) 50 MCG tablet Take 50 mcg by mouth daily.    . Multiple Vitamins-Minerals (CENTRUM SILVER PO) Take 1 tablet by mouth daily. Dose Unknown    . simvastatin (ZOCOR) 20 MG tablet Take 20 mg by mouth every evening.    Marland Kitchen acetaminophen (TYLENOL) 325 MG tablet Take 650 mg by mouth every 6 (six) hours as needed for mild pain.    Marland Kitchen aspirin 81 MG chewable tablet Chew 81 mg by  mouth daily.    . Calcium Carb-Cholecalciferol (CALTRATE 600+D) 600-800 MG-UNIT TABS Take 1 tablet by mouth daily.    . feeding supplement, GLUCERNA SHAKE, (GLUCERNA SHAKE) LIQD Take 237 mLs by mouth 2 (two) times daily between meals.  0  . pantoprazole (PROTONIX) 40 MG tablet Take 1 tablet (40 mg total) by mouth daily. 60 tablet 0  . potassium chloride (K-DUR) 10 MEQ tablet Take 1 tablet (10 mEq total) by mouth daily. 30 tablet 0  . senna-docusate (SENOKOT-S) 8.6-50 MG tablet Take 1 tablet by mouth at bedtime. (Patient taking differently: Take 1 tablet by mouth at bedtime  as needed for mild constipation. ) 30 tablet 0   No current facility-administered medications for this visit.     OBJECTIVE: Older African-American woman in no acute distress  Vitals:   04/21/18 0813  BP: (!) 152/67  Pulse: 65  Resp: 18  Temp: 97.8 F (36.6 C)  SpO2: 98%     Body mass index is 24.66 kg/m.   Wt Readings from Last 3 Encounters:  04/21/18 139 lb 3.2 oz (63.1 kg)  02/10/18 138 lb 1.9 oz (62.7 kg)  02/01/18 144 lb 2.9 oz (65.4 kg)      ECOG FS:0 - Asymptomatic  Ocular: Sclerae unicteric, pupils round and equal Ear-nose-throat: full upper plate Lymphatic: No cervical or supraclavicular adenopathy Lungs no rales or rhonchi Heart regular rate and rhythm Abd soft, nontender, positive bowel sounds MSK no focal spinal tenderness, no joint edema Neuro: non-focal, well-oriented, appropriate affect Breasts: There is a subcutaneous palpable mass in the lower inner quadrant of the right breast.  It is easily movable.  It is not tender.  There is no associated erythema.  The left breast is benign.  Both axillae are benign.   LAB RESULTS:  CMP     Component Value Date/Time   NA 142 04/21/2018 0800   NA 140 02/10/2018 0823   K 3.8 04/21/2018 0800   CL 105 04/21/2018 0800   CO2 26 04/21/2018 0800   GLUCOSE 213 (H) 04/21/2018 0800   BUN 67 (H) 04/21/2018 0800   BUN 59 (H) 02/10/2018 0823   CREATININE 4.03 (HH) 04/21/2018 0800   CREATININE 2.89 (H) 07/07/2016 1019   CALCIUM 9.4 04/21/2018 0800   CALCIUM 9.6 03/25/2018 1005   PROT 7.6 04/21/2018 0800   PROT 6.3 07/29/2017 0828   ALBUMIN 3.9 04/21/2018 0800   ALBUMIN 3.7 07/29/2017 0828   AST 23 04/21/2018 0800   ALT 12 04/21/2018 0800   ALKPHOS 67 04/21/2018 0800   BILITOT 0.4 04/21/2018 0800   GFRNONAA 9 (L) 04/21/2018 0800   GFRAA 11 (L) 04/21/2018 0800    No results found for: TOTALPROTELP, ALBUMINELP, A1GS, A2GS, BETS, BETA2SER, GAMS, MSPIKE, SPEI  No results found for: KPAFRELGTCHN, LAMBDASER,  KAPLAMBRATIO  Lab Results  Component Value Date   WBC 4.5 04/21/2018   NEUTROABS 3.1 04/21/2018   HGB 11.9 04/21/2018   HCT 38.2 04/21/2018   MCV 85.3 04/21/2018   PLT 172 04/21/2018    _0 @  No results found for: LABCA2  No components found for: MEBRAX094  No results for input(s): INR in the last 168 hours.  No results found for: LABCA2  No results found for: MHW808  No results found for: UPJ031  No results found for: RXY585  No results found for: CA2729  No components found for: HGQUANT  No results found for: CEA1 / No results found for: CEA1   No results found for: [facility]  No results found for: CHROMOGRNA  No results found for: PSA1  Appointment on 04/21/2018  Component Date Value Ref Range Status  . WBC Count 04/21/2018 4.5  3.9 - 10.3 K/uL Final  . RBC 04/21/2018 4.48  3.70 - 5.45 MIL/uL Final  . Hemoglobin 04/21/2018 11.9  11.6 - 15.9 g/dL Final  . HCT 04/21/2018 38.2  34.8 - 46.6 % Final  . MCV 04/21/2018 85.3  79.5 - 101.0 fL Final  . MCH 04/21/2018 26.6  25.1 - 34.0 pg Final  . MCHC 04/21/2018 31.2* 31.5 - 36.0 g/dL Final  . RDW 04/21/2018 16.8* 11.2 - 14.5 % Final  . Platelet Count 04/21/2018 172  145 - 400 K/uL Final  . Neutrophils Relative % 04/21/2018 68  % Final  . Neutro Abs 04/21/2018 3.1  1.5 - 6.5 K/uL Final  . Lymphocytes Relative 04/21/2018 13  % Final  . Lymphs Abs 04/21/2018 0.6* 0.9 - 3.3 K/uL Final  . Monocytes Relative 04/21/2018 14  % Final  . Monocytes Absolute 04/21/2018 0.6  0.1 - 0.9 K/uL Final  . Eosinophils Relative 04/21/2018 4  % Final  . Eosinophils Absolute 04/21/2018 0.2  0.0 - 0.5 K/uL Final  . Basophils Relative 04/21/2018 1  % Final  . Basophils Absolute 04/21/2018 0.0  0.0 - 0.1 K/uL Final   Performed at [facility], [place] [address] [place], [place] [address]  . Sodium 04/21/2018 142  136 - 145 mmol/L Final  . Potassium 04/21/2018 3.8  3.5 - 5.1 mmol/L Final  . Chloride  04/21/2018 105  98 - 109 mmol/L Final  . CO2 04/21/2018 26  22 - 29 mmol/L Final  . Glucose, Bld 04/21/2018 213* 70 - 140 mg/dL Final  . BUN 04/21/2018 67* 7 - 26 mg/dL Final  . Creatinine 04/21/2018 4.03* 0.60 - 1.10 mg/dL Final   CRITICAL RESULT CALLED TO, READ BACK BY AND VERIFIED WITH: Rogue Jury, RN [phone]    . Calcium 04/21/2018 9.4  8.4 - 10.4 mg/dL Final  . Total Protein 04/21/2018 7.6  6.4 - 8.3 g/dL Final  . Albumin 04/21/2018 3.9  3.5 - 5.0 g/dL Final  . AST 04/21/2018 23  5 - 34 U/L Final  . ALT 04/21/2018 12  0 - 55 U/L Final  . Alkaline Phosphatase 04/21/2018 67  40 - 150 U/L Final  . Total Bilirubin 04/21/2018 0.4  0.2 - 1.2 mg/dL Final  . GFR, Est Non Af Am 04/21/2018 9* >60 mL/min Final  . GFR, Est AFR Am 04/21/2018 11* >60 mL/min Final   Comment: (NOTE) The eGFR has been calculated using the CKD EPI equation. This calculation has not been validated in all clinical situations. eGFR's persistently <60 mL/min signify possible Chronic Kidney Disease.   Georgiann Hahn gap 04/21/2018 11  3 - 11 Final   Performed at [facility], [place] [address] [place], [place] [address]    (this displays the last labs from the last 3 days)  No results found for: TOTALPROTELP, ALBUMINELP, A1GS, A2GS, BETS, BETA2SER, GAMS, MSPIKE, SPEI (this displays SPEP labs)  No results found for: KPAFRELGTCHN, LAMBDASER, KAPLAMBRATIO (kappa/lambda light chains)  No results found for: HGBA, HGBA2QUANT, HGBFQUANT, HGBSQUAN (Hemoglobinopathy evaluation)   No results found for: LDH  Lab Results  Component Value Date   IRON 109 03/25/2018   TIBC 270 03/25/2018   IRONPCTSAT 40 (H) 03/25/2018   (Iron and TIBC)  Lab Results  Component Value Date   FERRITIN 589 (H)  03/25/2018    Urinalysis    Component Value Date/Time   COLORURINE YELLOW 12/01/2017 1437   APPEARANCEUR HAZY (A) 12/01/2017 1437   LABSPEC 1.013 12/01/2017 1437   PHURINE 5.0 12/01/2017 1437   GLUCOSEU  NEGATIVE 12/01/2017 1437   HGBUR LARGE (A) 12/01/2017 1437   BILIRUBINUR NEGATIVE 12/01/2017 1437   KETONESUR NEGATIVE 12/01/2017 1437   PROTEINUR 30 (A) 12/01/2017 1437   NITRITE NEGATIVE 12/01/2017 1437   LEUKOCYTESUR TRACE (A) 12/01/2017 1437     STUDIES:  mammography results discussed with patient  ELIGIBLE FOR AVAILABLE RESEARCH PROTOCOL: no  ASSESSMENT: 84 y.o. [place], [place] woman is post right breast lower inner quadrant biopsy 04/14/2018 for a clinical T1-2 N0, stage I invasive ductal carcinoma, grade 2, estrogen and progesterone receptor strongly positive, HER-2 not amplified, with an MIB-1 of 10%  (1) definitive surgery pending  (2) consider adjuvant radiation depending on final pathology results  (3) antiestrogens to follow  PLAN: We spent the better part of today's hour-long appointment discussing the biology of her diagnosis and the specifics of her situation. We first reviewed the fact that cancer is not one disease but more than 100 different diseases and that it is important to keep them separate-- otherwise when friends and relatives discuss their own cancer experiences with Serayah confusion can result. Similarly we explained that if breast cancer spreads to the bone or liver, the patient would not have bone cancer or liver cancer, but breast cancer in the bone and breast cancer in the liver: one cancer in three places-- not 3 different cancers which otherwise would have to be treated in 3 different ways.  We discussed the difference between local and systemic therapy. In terms of loco-regional treatment, lumpectomy plus radiation is equivalent to mastectomy as far as survival is concerned. For this reason, and because the cosmetic results are generally superior, we recommend breast conserving surgery.   Next we went over the options for systemic therapy which are anti-estrogens, anti-HER-2 immunotherapy, and chemotherapy. Elainah does not meet criteria for anti-HER-2  immunotherapy. She is a good candidate for anti-estrogens.  The question of chemotherapy is more complicated. Chemotherapy is most effective in rapidly growing, aggressive tumors. It is much less effective in slow growing cancers, like Akeila 's.  In addition, there is very little data regarding chemotherapy treatments in women over 85.  Accordingly at this point we do not anticipate her needing adjuvant chemotherapy or benefiting significantly from adjuvant chemotherapy   The plan then is for surgery, followed by a discussion regarding radiation, followed by antiestrogens, most likely anastrozole.  Amara has a good understanding of the overall plan. She agrees with it. She knows the goal of treatment in her case is cure. She will call with any problems that may develop before her next visit here.   Ruben Pyka, Virgie Dad, MD  04/21/18 10:12 AM Medical Oncology and Hematology [facility] [address] [place], [place] [address] Tel. [phone]    Fax. [phone]  Alice Rieger, am acting as scribe for Chauncey Cruel MD.  I, Lurline Del MD, have reviewed the above documentation for accuracy and completeness, and I agree with the above.

## 2018-04-20 ENCOUNTER — Other Ambulatory Visit: Payer: Self-pay | Admitting: *Deleted

## 2018-04-20 DIAGNOSIS — C50919 Malignant neoplasm of unspecified site of unspecified female breast: Secondary | ICD-10-CM

## 2018-04-21 ENCOUNTER — Encounter: Payer: Self-pay | Admitting: *Deleted

## 2018-04-21 ENCOUNTER — Inpatient Hospital Stay: Payer: Medicare HMO | Attending: Oncology | Admitting: Oncology

## 2018-04-21 ENCOUNTER — Inpatient Hospital Stay: Payer: Medicare HMO

## 2018-04-21 ENCOUNTER — Ambulatory Visit: Payer: Medicare HMO | Admitting: Physical Therapy

## 2018-04-21 ENCOUNTER — Encounter: Payer: Self-pay | Admitting: Radiation Oncology

## 2018-04-21 ENCOUNTER — Other Ambulatory Visit: Payer: Self-pay | Admitting: *Deleted

## 2018-04-21 ENCOUNTER — Ambulatory Visit
Admission: RE | Admit: 2018-04-21 | Discharge: 2018-04-21 | Disposition: A | Discharge status: Home / Self Care | Payer: Medicare HMO | Source: Ambulatory Visit | Attending: Radiation Oncology | Admitting: Radiation Oncology

## 2018-04-21 VITALS — BP 152/67 | HR 65 | Temp 97.8°F | Resp 18 | Ht 63.0 in | Wt 139.2 lb

## 2018-04-21 DIAGNOSIS — Z87891 Personal history of nicotine dependence: Secondary | ICD-10-CM | POA: Diagnosis not present

## 2018-04-21 DIAGNOSIS — Z170 Estrogen receptor positive status [ER+]: Secondary | ICD-10-CM | POA: Insufficient documentation

## 2018-04-21 DIAGNOSIS — C50919 Malignant neoplasm of unspecified site of unspecified female breast: Secondary | ICD-10-CM

## 2018-04-21 DIAGNOSIS — C50911 Malignant neoplasm of unspecified site of right female breast: Secondary | ICD-10-CM | POA: Diagnosis not present

## 2018-04-21 DIAGNOSIS — C50311 Malignant neoplasm of lower-inner quadrant of right female breast: Secondary | ICD-10-CM

## 2018-04-21 HISTORY — DX: Malignant neoplasm of lower-inner quadrant of right female breast: C50.311

## 2018-04-21 LAB — CBC WITH DIFFERENTIAL (CANCER CENTER ONLY)
BASOS ABS: 0 10*3/uL (ref 0.0–0.1)
BASOS PCT: 1 %
EOS ABS: 0.2 10*3/uL (ref 0.0–0.5)
EOS PCT: 4 %
HCT: 38.2 % (ref 34.8–46.6)
Hemoglobin: 11.9 g/dL (ref 11.6–15.9)
Lymphocytes Relative: 13 %
Lymphs Abs: 0.6 10*3/uL — ABNORMAL LOW (ref 0.9–3.3)
MCH: 26.6 pg (ref 25.1–34.0)
MCHC: 31.2 g/dL — ABNORMAL LOW (ref 31.5–36.0)
MCV: 85.3 fL (ref 79.5–101.0)
Monocytes Absolute: 0.6 10*3/uL (ref 0.1–0.9)
Monocytes Relative: 14 %
NEUTROS PCT: 68 %
Neutro Abs: 3.1 10*3/uL (ref 1.5–6.5)
PLATELETS: 172 10*3/uL (ref 145–400)
RBC: 4.48 MIL/uL (ref 3.70–5.45)
RDW: 16.8 % — ABNORMAL HIGH (ref 11.2–14.5)
WBC: 4.5 10*3/uL (ref 3.9–10.3)

## 2018-04-21 LAB — CMP (CANCER CENTER ONLY)
ALBUMIN: 3.9 g/dL (ref 3.5–5.0)
ALK PHOS: 67 U/L (ref 40–150)
ALT: 12 U/L (ref 0–55)
AST: 23 U/L (ref 5–34)
Anion gap: 11 (ref 3–11)
BUN: 67 mg/dL — ABNORMAL HIGH (ref 7–26)
CALCIUM: 9.4 mg/dL (ref 8.4–10.4)
CHLORIDE: 105 mmol/L (ref 98–109)
CO2: 26 mmol/L (ref 22–29)
CREATININE: 4.03 mg/dL — AB (ref 0.60–1.10)
GFR, Est AFR Am: 11 mL/min — ABNORMAL LOW (ref >60)
GFR, Estimated: 9 mL/min — ABNORMAL LOW (ref >60)
Glucose, Bld: 213 mg/dL — ABNORMAL HIGH (ref 70–140)
Potassium: 3.8 mmol/L (ref 3.5–5.1)
SODIUM: 142 mmol/L (ref 136–145)
Total Bilirubin: 0.4 mg/dL (ref 0.2–1.2)
Total Protein: 7.6 g/dL (ref 6.4–8.3)

## 2018-04-21 NOTE — Progress Notes (Signed)
Radiation Oncology         [phone] ________________________________  Initial Outpatient Consultation  Name: Mandy Larson MRN: [id number]  Date: 04/21/2018  DOB: 1930-01-17  [id number], Jori Moll, MD  Excell Seltzer, MD   REFERRING PHYSICIAN: Excell Seltzer, MD  DIAGNOSIS:    ICD-10-CM   1. 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 female who presented with screening detected right breast calcifications on 04/07/18 mammography. Diagnostic mammogram from 04/09/18 showed segmental pleomorphic calcifications, spanning 4.7 cm, in the right breast lower inner quadrant middle depth. Ultrasound on 04/14/18 showed a 1.1 cm mass in the right breast lower inner quadrant middle depth, 4 cm from the nipple, with related calcifications. No abnormalities were seen in the right axilla. Biopsy showed invasive ductal carcinoma, measuring 0.7 cm, with characteristics as described above in the diagnosis, as well as DCIS and calcifications.  On review of systems, the patient is positive for weight change - 5lb decreased over past few months. She is positive for change in vision and wears glasses. She wears dentures. She is positive for swelling in her feet. She is positive for shortness of breath with walking. She sleeps on 2 pillows. She is positive for poor appetite. She is positive for kidney disease. She is positive for  psoriasis. She is positive for diabetes.   GYNECOLOGIC HISTORY: -Age at first menstrual period: 68 -Are you still having periods?: No -Have you used hormone replacement?: No  OBSTETRIC HISTORY: -How many children have you carried to term?: 2 -Have you used birth control pills or hormone shots for contraception?: No  PREVIOUS RADIATION THERAPY: No  PAST MEDICAL HISTORY:  has a past medical history of Anemia, CAD (coronary artery disease) (06/26/2014), Chronic combined systolic and diastolic CHF (congestive heart failure) ([place]) (11/08/2012), CKD (chronic kidney disease), Diabetes mellitus without complication ([place]), Former smoker, Heart attack ([place]), Hyperlipidemia, Hypertension, Malignant neoplasm of lower-inner quadrant of right breast of female, estrogen receptor positive ([place]) (04/21/2018), Pulmonary hypertension ([place]) (11/08/2012), and Severe mitral regurgitation (11/08/2012).    PAST SURGICAL HISTORY: Past Surgical History:  Procedure Laterality Date  . ESOPHAGOGASTRODUODENOSCOPY N/A 07/25/2017   Procedure: ESOPHAGOGASTRODUODENOSCOPY (EGD);  Surgeon: [name], MD;  Location: [facility];  Service: Gastroenterology;  Laterality: N/A;  . ESOPHAGOGASTRODUODENOSCOPY N/A 12/02/2017   Procedure: ESOPHAGOGASTRODUODENOSCOPY (EGD);  Surgeon: Ladene Artist, MD;  [facility] END[facility]COPY;  Service: Endoscopy;  Laterality: N/A;  . EYE SURGERY      FAMILY HISTORY: family history includes Hypertension in her father.  SOCIAL HISTORY:  reports that she quit smoking about 18 years ago. Her smoking use included cigarettes. She has never used smokeless tobacco. She reports that she does not drink alcohol or use drugs. Widowed. 2 sons. Retired.   ALLERGIES: Patient has no known allergies.  MEDICATIONS:  Current Outpatient Medications  Medication Sig Dispense Refill  . acetaminophen (TYLENOL) 325 MG tablet Take 650 mg by mouth every 6 (six) hours as needed for  mild pain.    Marland Kitchen amLODipine  (NORVASC) 10 MG tablet Take 10 mg by mouth daily.  4  . aspirin 81 MG chewable tablet Chew 81 mg by mouth daily.    . Calcium Carb-Cholecalciferol (CALTRATE 600+D) 600-800 MG-UNIT TABS Take 1 tablet by mouth daily.    . carvedilol (COREG) 3.125 MG tablet Take 1 tablet (3.125 mg total) by mouth 2 (two) times daily with a meal. 180 tablet 3  . dorzolamide-timolol (COSOPT) 22.3-6.8 MG/ML ophthalmic solution Place 1 drop into both eyes 2 (two) times daily.     . feeding supplement, GLUCERNA SHAKE, (GLUCERNA SHAKE) LIQD Take 237 mLs by mouth 2 (two) times daily between meals.  0  . furosemide (LASIX) 20 MG tablet Take 1 tablet (20 mg total) by mouth 2 (two) times daily. 60 tablet 0  . glipiZIDE (GLUCOTROL) 5 MG tablet Take 2.5 mg by mouth daily before breakfast.     . isosorbide mononitrate (IMDUR) 30 MG 24 hr tablet Take 1 tablet (30 mg total) by mouth daily. 30 tablet 5  . latanoprost (XALATAN) 0.005 % ophthalmic solution Place 1 drop into both eyes at bedtime.     Marland Kitchen levothyroxine (SYNTHROID, LEVOTHROID) 50 MCG tablet Take 50 mcg by mouth daily.    . Multiple Vitamins-Minerals (CENTRUM SILVER PO) Take 1 tablet by mouth daily. Dose Unknown    . pantoprazole (PROTONIX) 40 MG tablet Take 1 tablet (40 mg total) by mouth daily. 60 tablet 0  . potassium chloride (K-DUR) 10 MEQ tablet Take 1 tablet (10 mEq total) by mouth daily. 30 tablet 0  . senna-docusate (SENOKOT-S) 8.6-50 MG tablet Take 1 tablet by mouth at bedtime. (Patient taking differently: Take 1 tablet by mouth at bedtime as needed for mild constipation. ) 30 tablet 0  . simvastatin (ZOCOR) 20 MG tablet Take 20 mg by mouth every evening.     No current facility-administered medications for this encounter.     REVIEW OF SYSTEMS: A 10+ POINT REVIEW OF SYSTEMS WAS OBTAINED including neurology, dermatology, psychiatry, cardiac, respiratory, lymph, extremities, GI, GU, Musculoskeletal, constitutional, breasts, reproductive, HEENT.  All pertinent  positives are noted in the HPI.  All others are negative.   PHYSICAL EXAM:  Vitals with BMI 04/21/2018  Height '5\' 3"'$   Weight 139 lbs 3 oz  BMI 76.73  Systolic 419  Diastolic 67  Pulse 65  Respirations 18   General: Alert and oriented, in no acute distress. HEENT: Head is normocephalic. Extraocular movements are intact. She has slight left ptosis. Oropharynx is clear. Upper and lower dentures were not removed.  Neck: Neck is supple, no palpable cervical or supraclavicular lymphadenopathy. Heart: Regular in rate and rhythm. She had a soft systolic murmur heard throughout the precordium.  Chest: Clear to auscultation bilaterally, with no rhonchi, wheezes, or rales. Abdomen: Soft, nontender, nondistended, with no rigidity or guarding. Extremities: No cyanosis or edema. Lymphatics: see Neck Exam Skin: No concerning lesions. Musculoskeletal: Symmetric strength and muscle tone throughout. Neurologic: Cranial nerves II through XII are grossly intact. No obvious focalities. Speech is fluent. Coordination is intact. Psychiatric: Judgment and insight are intact. Affect is appropriate. Breasts: She had a 1.5 cm mass in the LIQ of the right breast.  ECOG = 1  0 - Asymptomatic (Fully active, able to carry on all predisease activities without restriction)  1 - Symptomatic but completely ambulatory (Restricted in physically strenuous activity but ambulatory and able to carry out work of a light or sedentary nature. For example, light  housework, office work)  2 - Symptomatic, <50% in bed during the day (Ambulatory and capable of all self care but unable to carry out any work activities. Up and about more than 50% of waking hours)  3 - Symptomatic, >50% in bed, but not bedbound (Capable of only limited self-care, confined to bed or chair 50% or more of waking hours)  4 - Bedbound (Completely disabled. Cannot carry on any self-care. Totally confined to bed or chair)  5 - Death   Eustace Pen MM, Creech  RH, Tormey DC, et al. [phone]). "Toxicity and response criteria of the [facility]". [place] Oncol. 5 (6): 649-55   LABORATORY DATA:  Lab Results  Component Value Date   WBC 4.5 04/21/2018   HGB 11.9 04/21/2018   HCT 38.2 04/21/2018   MCV 85.3 04/21/2018   PLT 172 04/21/2018   CMP     Component Value Date/Time   NA 142 04/21/2018 0800   NA 140 02/10/2018 0823   K 3.8 04/21/2018 0800   CL 105 04/21/2018 0800   CO2 26 04/21/2018 0800   GLUCOSE 213 (H) 04/21/2018 0800   BUN 67 (H) 04/21/2018 0800   BUN 59 (H) 02/10/2018 0823   CREATININE 4.03 (HH) 04/21/2018 0800   CREATININE 2.89 (H) 07/07/2016 1019   CALCIUM 9.4 04/21/2018 0800   CALCIUM 9.6 03/25/2018 1005   PROT 7.6 04/21/2018 0800   PROT 6.3 07/29/2017 0828   ALBUMIN 3.9 04/21/2018 0800   ALBUMIN 3.7 07/29/2017 0828   AST 23 04/21/2018 0800   ALT 12 04/21/2018 0800   ALKPHOS 67 04/21/2018 0800   BILITOT 0.4 04/21/2018 0800   GFRNONAA 9 (L) 04/21/2018 0800   GFRAA 11 (L) 04/21/2018 0800         RADIOGRAPHY: In HPI.    IMPRESSION/PLAN: Right Breast Cancer   She is enthusiastic about breast conservation. She will undergo an MRI to verify if this is feasible. She understands that if she undergoes breast conservation, she may benefit from adjuvant radiotherapy.   It was a pleasure meeting the patient today. We discussed the risks, benefits, and side effects of radiotherapy. I may recommend radiotherapy to the right breast if she undergoes lumpectomy to reduce her risk of locoregional recurrence by 2/3.  We discussed that radiation would take approximately 3.5-4 weeks to complete and that I would give the patient a few weeks to heal following surgery before starting treatment planning. We spoke about acute effects including skin irritation and fatigue as well as much less common late effects including internal organ injury or irritation. We spoke about the latest technology that is used to minimize  the risk of late effects for patients undergoing radiotherapy to the breast or chest wall. No guarantees of treatment were given.   It will be helpful to review the final pathologic stage and margin status to help determine the potential benefits of RT.   It should be noted that the patient has other co-morbidities and advanced age, and anti-estrogen therapy alone after lumpectomy MAY be a more reasonable option than anti-estrogens + RT.  This can be discussed at breast conference post operatively.  The patient had a heart murmur today. I recommended that she discuss this with her cardiologist and PCP.   __________________________________________   Eppie Gibson, MD  This document serves as a record of services personally performed by Eppie Gibson, MD. It was created on her behalf by Rae Lips, a trained medical scribe. The creation of this  record is based on the scribe's personal observations and the provider's statements to them. This document has been checked and approved by the attending provider.

## 2018-04-21 NOTE — Progress Notes (Signed)
Nutrition Assessment  Reason for Assessment:  Pt seen in Breast Clinic  ASSESSMENT:   84 year old female with new diagnosis of breast cancer.  Past medical history reviewed, noted approaching HD soon per MD note, CKD, CHF, CAD, HTN, DM  Patient reports appetite has been decreased recently.    Medications:  reviewed  Labs: glucose 213, BUN 67, creatinine 4.03  Anthropometrics:   Height: 63 inches Weight: 139 lb 3.2 BMI: 24  Noted weight 3/25 144 lb, 3% weight loss in the last 3 months, not significant   NUTRITION DIAGNOSIS: Food and nutrition related knowledge deficit related to new diagnosis of breast cancer as evidenced by no prior need for nutrition related information.  INTERVENTION:   Discussed and provided packet of information regarding nutritional tips for breast cancer patients.  Questions answered.  Teachback method used.  Contact information provided and patient knows to contact me with questions/concerns.    MONITORING, EVALUATION, and GOAL: Pt will consume a healthy plant based diet to maintain lean body mass throughout treatment.   Mandy Larson, [place], [place] Registered Dietitian [phone] (pager)

## 2018-04-21 NOTE — Progress Notes (Signed)
Clinical Social Work [place] Psychosocial Distress Screening [place]  Patient completed distress screening protocol and scored a 5 on the Psychosocial Distress Thermometer which indicates moderate distress. Clinical Social Worker met with patient in [facility] to assess for distress and other psychosocial needs. Patient stated she was feeling overwhelmed but felt "better" after meeting with the treatment team and getting more information on her treatment plan. CSW and patient discussed common feeling and emotions when being diagnosed with cancer, and the importance of support during treatment. CSW informed patient of the support team and support services at [facility]. CSW provided contact information and encouraged patient to call with any questions or concerns.  ONCBCN DISTRESS SCREENING 04/21/2018  Screening Type Initial Screening  Distress experienced in past week (1-10) 5  Emotional problem type Depression;Adjusting to illness  Physical Problem type Loss of appetitie;Constipation/diarrhea  Physician notified of physical symptoms Yes     Johnnye Lana, MSW, LCSW, OSW-C Clinical Social Worker [facility] [phone]

## 2018-04-21 NOTE — Progress Notes (Unsigned)
mrim

## 2018-04-22 ENCOUNTER — Encounter (HOSPITAL_COMMUNITY): Payer: Medicare HMO

## 2018-04-22 ENCOUNTER — Telehealth: Payer: Self-pay | Admitting: Oncology

## 2018-04-22 NOTE — Telephone Encounter (Signed)
Per 6/12 no los

## 2018-04-23 ENCOUNTER — Ambulatory Visit: Payer: Medicare HMO | Admitting: Podiatry

## 2018-04-23 ENCOUNTER — Ambulatory Visit: Payer: Self-pay | Admitting: General Surgery

## 2018-04-23 DIAGNOSIS — C50911 Malignant neoplasm of unspecified site of right female breast: Secondary | ICD-10-CM

## 2018-04-23 DIAGNOSIS — Z170 Estrogen receptor positive status [ER+]: Principal | ICD-10-CM

## 2018-04-26 ENCOUNTER — Ambulatory Visit (HOSPITAL_COMMUNITY): Admission: RE | Admit: 2018-04-26 | Payer: Medicare HMO | Source: Ambulatory Visit

## 2018-04-26 ENCOUNTER — Telehealth: Payer: Self-pay | Admitting: *Deleted

## 2018-04-26 NOTE — Telephone Encounter (Signed)
   [facility] Pre-operative Risk Assessment    Request for surgical clearance:  1. What type of surgery is being performed? BREAST PROCEDURE   2. When is this surgery scheduled? TBD   3. What type of clearance is required (medical clearance vs. Pharmacy clearance to hold med vs. Both)? BOTH  4. Are there any medications that need to be held prior to surgery and how long? CLEARING PROVIDER RECCOMENDATIONS   5. Practice name and name of physician performing surgery? DR HOXWORTH   6. What is your office phone number;  [phone]    7.   What is your office fax number: [place]  8.   Anesthesia type (None, local, MAC, general) ? GENERAL   Mandy Larson 04/26/2018, 4:09 PM  _________________________________________________________________   (provider comments below)

## 2018-04-30 ENCOUNTER — Ambulatory Visit: Payer: Medicare HMO | Admitting: Podiatry

## 2018-04-30 ENCOUNTER — Telehealth: Payer: Self-pay | Admitting: *Deleted

## 2018-04-30 DIAGNOSIS — B351 Tinea unguium: Secondary | ICD-10-CM | POA: Diagnosis not present

## 2018-04-30 DIAGNOSIS — M79676 Pain in unspecified toe(s): Secondary | ICD-10-CM | POA: Diagnosis not present

## 2018-04-30 DIAGNOSIS — M79609 Pain in unspecified limb: Principal | ICD-10-CM

## 2018-04-30 NOTE — Progress Notes (Signed)
Reports painful nails that she cannot care for herself  Subjective:  Patient ID: Mandy Larson, female    DOB: 03-22-1930,  MRN: [id number]  Chief Complaint  Patient presents with  . Nail Problem    3 month debride   84 y.o. female returns for the above complaint.  Continues to report painful nails she cannot care for herself  Objective:  There were no vitals filed for this visit. General AA&O x3. Normal mood and affect.  Vascular Pedal pulses palpable.  Neurologic Epicritic sensation grossly intact.  Dermatologic No open lesions. Skin normal texture and turgor. Toenails x 10 elongated, thickened, dystrophic.  Orthopedic: Pain to palpation about the toenails.   Assessment & Plan:  Patient was evaluated and treated and all questions answered.  Onychomycosis with pain  -Nails palliatively debrided as below. -Educated on self-care  Procedure: Nail Debridement Rationale: Pain. Type of Debridement: manual, sharp debridement. Instrumentation: Nail nipper, rotary burr. Number of Nails: 10    No follow-ups on file.

## 2018-04-30 NOTE — Telephone Encounter (Signed)
Spoke to pt concerning [place] from 6.12.19. Denies questions or concerns regarding dx or treatment care plan. Informed pt that she will receive a call from Dr. Lear Ng office to schedule sx once she has cardiac clearance from her cardiologist. Received verbal understanding. Contact information provided with questions or needs.

## 2018-05-04 NOTE — Telephone Encounter (Signed)
Mandy Larson is at high risk for breast surgery and anesthesia due to her multiple co-morbidities.    I would prefer that she continue with her ASA but if Dr. Excell Seltzer needs her to hold it, she may hold for 7 days .

## 2018-05-04 NOTE — Telephone Encounter (Signed)
   Primary Cardiologist: Mertie Moores, MD  Chart reviewed as part of pre-operative protocol coverage. Patient was contacted 05/04/2018 in reference to pre-operative risk assessment for pending surgery as outlined below.  Mandy Larson was last seen on 02/2018 by Dr. Acie Fredrickson. H/o chronic combined CHF, MR, HTN, DM, CKD IV-V, HLD, presumed CAD (not cathed due to renal failure, but decreased EF and +WMA on echo). Last Cr was 4.03  I do suspect she is at high risk for surgery given her multiple comorbidities and advanced age. Before reaching out to patient will forward to Dr. Acie Fredrickson for input on risk assessment. We have also been asked to help decide if aspirin needs to be held. I do not see specific notes about the type of surgery, but oncology notes indicate malignant neoplasm of breast.  Dr. Acie Fredrickson - - Please route response to P [facility] PREOP (the pre-op pool). Thank you.  Charlie Pitter, PA-C 05/04/2018, 2:44 PM

## 2018-05-05 NOTE — Telephone Encounter (Signed)
   Primary Cardiologist: Mertie Moores, MD  Chart reviewed as part of pre-operative protocol coverage with extensive PMH as outlined below.  Dr. Acie Fredrickson has reviewed chart and states, "Ms. Stoker is at high risk for breast surgery and anesthesia due to her multiple co-morbidities. I would prefer that she continue with her ASA but if Dr. Excell Seltzer needs her to hold it, she may hold for 7 days . " I spoke with patient who affirms no new cardiac symptoms and no CP/SOB. I informed her of Dr. Elmarie Shiley thoughts. Will forward this message to Dr. Lear Ng office as requested so they may review and make informed decision about surgery going forward.  I will route this recommendation to the requesting party via Epic fax function and remove from pre-op pool.  Please call with questions.  Charlie Pitter, PA-C 05/05/2018, 2:11 PM

## 2018-05-06 ENCOUNTER — Ambulatory Visit (HOSPITAL_COMMUNITY)
Admission: RE | Admit: 2018-05-06 | Discharge: 2018-05-06 | Disposition: A | Discharge status: Home / Self Care | Payer: Medicare HMO | Source: Ambulatory Visit | Attending: Nephrology | Admitting: Nephrology

## 2018-05-06 VITALS — BP 113/57 | HR 58 | Resp 18

## 2018-05-06 DIAGNOSIS — N184 Chronic kidney disease, stage 4 (severe): Secondary | ICD-10-CM | POA: Diagnosis not present

## 2018-05-06 LAB — RENAL FUNCTION PANEL
Albumin: 3.8 g/dL (ref 3.5–5.0)
Anion gap: 11 (ref 5–15)
BUN: 74 mg/dL — ABNORMAL HIGH (ref 8–23)
CO2: 25 mmol/L (ref 22–32)
Calcium: 9.2 mg/dL (ref 8.9–10.3)
Chloride: 104 mmol/L (ref 98–111)
Creatinine, Ser: 4.58 mg/dL — ABNORMAL HIGH (ref 0.44–1.00)
GFR, EST AFRICAN AMERICAN: 9 mL/min — AB (ref >60)
GFR, EST NON AFRICAN AMERICAN: 8 mL/min — AB (ref >60)
Glucose, Bld: 222 mg/dL — ABNORMAL HIGH (ref 70–99)
POTASSIUM: 3.8 mmol/L (ref 3.5–5.1)
Phosphorus: 5.3 mg/dL — ABNORMAL HIGH (ref 2.5–4.6)
Sodium: 140 mmol/L (ref 135–145)

## 2018-05-06 LAB — IRON AND TIBC
IRON: 88 ug/dL (ref 28–170)
Saturation Ratios: 36 % — ABNORMAL HIGH (ref 10.4–31.8)
TIBC: 242 ug/dL — ABNORMAL LOW (ref 250–450)
UIBC: 154 ug/dL

## 2018-05-06 LAB — POCT HEMOGLOBIN-HEMACUE: Hemoglobin: 11.1 g/dL — ABNORMAL LOW (ref 12.0–15.0)

## 2018-05-06 LAB — FERRITIN: Ferritin: 607 ng/mL — ABNORMAL HIGH (ref 11–307)

## 2018-05-06 MED ORDER — EPOETIN ALFA 20000 UNIT/ML IJ SOLN
INTRAMUSCULAR | Status: AC
  Filled 2018-05-06: qty 1

## 2018-05-06 MED ORDER — EPOETIN ALFA 20000 UNIT/ML IJ SOLN
20000.0000 [IU] | INTRAMUSCULAR | Status: DC
  Administered 2018-05-06: 20000 [IU] via SUBCUTANEOUS

## 2018-05-07 DIAGNOSIS — D631 Anemia in chronic kidney disease: Secondary | ICD-10-CM | POA: Diagnosis not present

## 2018-05-07 DIAGNOSIS — N184 Chronic kidney disease, stage 4 (severe): Secondary | ICD-10-CM | POA: Diagnosis not present

## 2018-05-07 DIAGNOSIS — I110 Hypertensive heart disease with heart failure: Secondary | ICD-10-CM | POA: Diagnosis not present

## 2018-05-07 DIAGNOSIS — I509 Heart failure, unspecified: Secondary | ICD-10-CM | POA: Diagnosis not present

## 2018-05-07 LAB — PTH, INTACT AND CALCIUM
Calcium, Total (PTH): 9.2 mg/dL (ref 8.7–10.3)
PTH: 106 pg/mL — AB (ref 15–65)

## 2018-05-26 NOTE — Pre-Procedure Instructions (Addendum)
Mandy Larson  05/26/2018      [id number] - [place], [place] [address] Phone: [phone] Fax: [phone]    Your procedure is scheduled on July 24  Report to [place] at 0700 A.M.  Call this number if you have problems the morning of surgery:  [phone]   Remember:  Do not eat after midnight.  You may drink clear liquids until 0600 .  Clear liquids allowed are: Water and Gatorade    Take these medicines the morning of surgery with A SIP OF WATER  acetaminophen (TYLENOL)  amLODipine (NORVASC) carvedilol (COREG) Eye drops if needed isosorbide mononitrate (IMDUR)  pantoprazole (PROTONIX) levothyroxine (SYNTHROID, LEVOTHROID)  7 days prior to surgery STOP taking any Aspirin(unless otherwise instructed by your surgeon), Aleve, Naproxen, Ibuprofen, Motrin, Advil, Goody's, BC's, all herbal medications, fish oil, and all vitamins  Follow your doctors instructions regarding your Aspirin.  If no instructions were given by your doctor, then you will need to call the prescribing office office to get instructions.      WHAT DO I DO ABOUT MY DIABETES MEDICATION?   Marland Kitchen Do not take oral diabetes medicines (pills) the morning of surgery. glipiZIDE (GLUCOTROL)   How to Manage Your Diabetes Before and After Surgery  Why is it important to control my blood sugar before and after surgery? . Improving blood sugar levels before and after surgery helps healing and can limit problems. . A way of improving blood sugar control is eating a healthy diet by: o  Eating less sugar and carbohydrates o  Increasing activity/exercise o  Talking with your doctor about reaching your blood sugar goals . High blood sugars (greater than 180 mg/dL) can raise your risk of infections and slow your recovery, so you will need to focus on controlling your diabetes during the weeks before surgery. . Make sure that the doctor  who takes care of your diabetes knows about your planned surgery including the date and location.  How do I manage my blood sugar before surgery? . Check your blood sugar at least 4 times a day, starting 2 days before surgery, to make sure that the level is not too high or low. o Check your blood sugar the morning of your surgery when you wake up and every 2 hours until you get to the Short Stay unit. . If your blood sugar is less than 70 mg/dL, you will need to treat for low blood sugar: o Do not take insulin. o Treat a low blood sugar (less than 70 mg/dL) with  cup of clear juice (cranberry or apple), 4 glucose tablets, OR glucose gel. o Recheck blood sugar in 15 minutes after treatment (to make sure it is greater than 70 mg/dL). If your blood sugar is not greater than 70 mg/dL on recheck, call [phone] for further instructions. . Report your blood sugar to the short stay nurse when you get to Short Stay.  . If you are admitted to the hospital after surgery: o Your blood sugar will be checked by the staff and you will probably be given insulin after surgery (instead of oral diabetes medicines) to make sure you have good blood sugar levels. o The goal for blood sugar control after surgery is 80-180 mg/dL.    Do not wear jewelry, make-up or nail polish.  Do not wear lotions, powders, or perfumes, or deodorant.  Do not shave 48 hours prior to surgery.  Do not bring valuables to the hospital.  [facility] is not responsible for any belongings or valuables.  Contacts, dentures or bridgework may not be worn into surgery.  Leave your suitcase in the car.  After surgery it may be brought to your room.  For patients admitted to the hospital, discharge time will be determined by your treatment team.  Patients discharged the day of surgery will not be allowed to drive home.    Special instructions:   [place]- Preparing For Surgery  Before surgery, you can play an important role.  Because skin is not sterile, your skin needs to be as free of germs as possible. You can reduce the number of germs on your skin by washing with CHG (chlorahexidine gluconate) Soap before surgery.  CHG is an antiseptic cleaner which kills germs and bonds with the skin to continue killing germs even after washing.    Oral Hygiene is also important to reduce your risk of infection.  Remember - BRUSH YOUR TEETH THE MORNING OF SURGERY WITH YOUR REGULAR TOOTHPASTE  Please do not use if you have an allergy to CHG or antibacterial soaps. If your skin becomes reddened/irritated stop using the CHG.  Do not shave (including legs and underarms) for at least 48 hours prior to first CHG shower. It is OK to shave your face.  Please follow these instructions carefully.   1. Shower the NIGHT BEFORE SURGERY and the MORNING OF SURGERY with CHG.   2. If you chose to wash your hair, wash your hair first as usual with your normal shampoo.  3. After you shampoo, rinse your hair and body thoroughly to remove the shampoo.  4. Use CHG as you would any other liquid soap. You can apply CHG directly to the skin and wash gently with a scrungie or a clean washcloth.   5. Apply the CHG Soap to your body ONLY FROM THE NECK DOWN.  Do not use on open wounds or open sores. Avoid contact with your eyes, ears, mouth and genitals (private parts). Wash Face and genitals (private parts)  with your normal soap.  6. Wash thoroughly, paying special attention to the area where your surgery will be performed.  7. Thoroughly rinse your body with warm water from the neck down.  8. DO NOT shower/wash with your normal soap after using and rinsing off the CHG Soap.  9. Pat yourself dry with a CLEAN TOWEL.  10. Wear CLEAN PAJAMAS to bed the night before surgery, wear comfortable clothes the morning of surgery  11. Place CLEAN SHEETS on your bed the night of your first shower and DO NOT SLEEP WITH PETS.    Day of Surgery:  Do not  apply any deodorants/lotions.  Please wear clean clothes to the hospital/surgery center.   Remember to brush your teeth WITH YOUR REGULAR TOOTHPASTE.    Please read over the following fact sheets that you were given.

## 2018-05-27 ENCOUNTER — Encounter (HOSPITAL_COMMUNITY): Payer: Self-pay

## 2018-05-27 ENCOUNTER — Encounter (HOSPITAL_COMMUNITY)
Admission: RE | Admit: 2018-05-27 | Discharge: 2018-05-27 | Disposition: A | Discharge status: Home / Self Care | Payer: Medicare HMO | Source: Ambulatory Visit | Attending: General Surgery | Admitting: General Surgery

## 2018-05-27 ENCOUNTER — Other Ambulatory Visit: Payer: Self-pay

## 2018-05-27 DIAGNOSIS — N184 Chronic kidney disease, stage 4 (severe): Secondary | ICD-10-CM | POA: Insufficient documentation

## 2018-05-27 DIAGNOSIS — Z01812 Encounter for preprocedural laboratory examination: Secondary | ICD-10-CM | POA: Diagnosis present

## 2018-05-27 DIAGNOSIS — D631 Anemia in chronic kidney disease: Secondary | ICD-10-CM | POA: Insufficient documentation

## 2018-05-27 LAB — BASIC METABOLIC PANEL
Anion gap: 10 (ref 5–15)
BUN: 68 mg/dL — ABNORMAL HIGH (ref 8–23)
CALCIUM: 9.2 mg/dL (ref 8.9–10.3)
CO2: 25 mmol/L (ref 22–32)
Chloride: 104 mmol/L (ref 98–111)
Creatinine, Ser: 4.1 mg/dL — ABNORMAL HIGH (ref 0.44–1.00)
GFR calc Af Amer: 10 mL/min — ABNORMAL LOW (ref >60)
GFR, EST NON AFRICAN AMERICAN: 9 mL/min — AB (ref >60)
GLUCOSE: 161 mg/dL — AB (ref 70–99)
Potassium: 4.2 mmol/L (ref 3.5–5.1)
Sodium: 139 mmol/L (ref 135–145)

## 2018-05-27 LAB — CBC
HEMATOCRIT: 36.8 % (ref 36.0–46.0)
HEMOGLOBIN: 11 g/dL — AB (ref 12.0–15.0)
MCH: 26.6 pg (ref 26.0–34.0)
MCHC: 29.9 g/dL — AB (ref 30.0–36.0)
MCV: 88.9 fL (ref 78.0–100.0)
Platelets: 171 10*3/uL (ref 150–400)
RBC: 4.14 MIL/uL (ref 3.87–5.11)
RDW: 16.1 % — ABNORMAL HIGH (ref 11.5–15.5)
WBC: 4.7 10*3/uL (ref 4.0–10.5)

## 2018-05-27 LAB — GLUCOSE, CAPILLARY: Glucose-Capillary: 157 mg/dL — ABNORMAL HIGH (ref 70–99)

## 2018-05-27 LAB — HEMOGLOBIN A1C
HEMOGLOBIN A1C: 6.2 % — AB (ref 4.8–5.6)
MEAN PLASMA GLUCOSE: 131.24 mg/dL

## 2018-05-27 NOTE — Progress Notes (Addendum)
PCP - [name] Cardiologist - [place]  Chest x-ray - not needed EKG - 01/31/18 Stress Test - not needed ECHO - 2019 Cardiac Cath - denies, did not have a cath with heart attack in 2013 per patient  Patient does not check her blood sugar at home  Aspirin Instructions: stop 5 days   Per Dr. Excell Seltzer - patient aware  Anesthesia review: yes, cardiac history   Patient denies shortness of breath, fever, cough and chest pain at PAT appointment   Patient verbalized understanding of instructions that were given to them at the PAT appointment. Patient was also instructed that they will need to review over the PAT instructions again at home before surgery.

## 2018-05-28 ENCOUNTER — Encounter (HOSPITAL_COMMUNITY): Payer: Self-pay | Admitting: Vascular Surgery

## 2018-05-28 DIAGNOSIS — D631 Anemia in chronic kidney disease: Secondary | ICD-10-CM | POA: Diagnosis not present

## 2018-05-28 DIAGNOSIS — N184 Chronic kidney disease, stage 4 (severe): Secondary | ICD-10-CM | POA: Diagnosis not present

## 2018-05-28 DIAGNOSIS — I509 Heart failure, unspecified: Secondary | ICD-10-CM | POA: Diagnosis not present

## 2018-05-28 DIAGNOSIS — I110 Hypertensive heart disease with heart failure: Secondary | ICD-10-CM | POA: Diagnosis not present

## 2018-05-28 NOTE — Progress Notes (Signed)
Anesthesia Chart Review:  Case:  [id number] Date/Time:  06/02/18 0845   Procedure:  BREAST LUMPECTOMY WITH RADIOACTIVE SEED LOCALIZATION (Right Breast)   Anesthesia type:  General   Pre-op diagnosis:  RIGHT BREAST CANCER   Location:  [facility] OR ROOM 01 / [facility]   Surgeon:  Excell Seltzer, MD      DISCUSSION: Patient is a 84 year old female scheduled for the above procedure. Seed implant date is still pending.   History includes former smoker (quit '94), CAD/inferior NSTEMI 11/06/12 (medical treatment; no LHC due to CKD), chronic combined systolic and diastolic CHF (last hospitalization 01/2018, EF 40-45%), severe mitral regurgitation (due to papillary muscle dysfunction from inferior MI '13; moderate MR 01/2018 echo), pulmonary hypertension (PA peak pressure 52 mHg 01/2018), CKD stage IV, HTN, DM2, HLD, anemia.   Dr. Excell Seltzer did request cardiology clearance for surgery. Per telephone encounter by Melina Copa, PA-C on 05/05/18, "Dr. Acie Fredrickson has reviewed chart and states, 'Mandy Larson is at high risk for breast surgery and anesthesia due to her multiple co-morbidities.I would prefer that she continue with her ASA but if Dr. Excell Seltzer needs her to hold it, she may hold for 7 days.' I spoke with patient who affirms no new cardiac symptoms and no CP/SOB. I informed her of Dr. Elmarie Shiley thoughts. Will forward this message to Dr. Lear Ng office as requested so they may review and make informed decision about surgery going forward." Per PAT RN notes, surgeon instructed patient to hold ASA for 5 days prior to surgery.   Anesthesia APP was not consulted during PAT visit, but documentation states patient denied chest pain, SOB, cough, fever. As outlined below, nephrologist Dr. Moshe Cipro did discuss with patient at her 05/07/18 whether or not she would consider pursuing dialysis in the future if needed. Patient was leaning towards no, but it doesn't appear that there was a final decision. If patient ultimately decided  against dialysis then Dr. Moshe Cipro was planning to let the cancer team know to see if that would influence treatment recommendations. I'm unsure if this has happened, but it does appear her case was presented to the multidisciplinary breast cancer clinic on 04/21/18. No chemotherapy is planned by Dr. Virgie Dad note.  I did speak with [facility] triage nurse Mandy Larson regarding this. She reported that they do have a copy of Dr. Shelva Majestic note at their office and will have him review. Dr. Excell Seltzer can contact me or other team members if need to discuss further. If no change in plan then it is anticipated that she can proceed as planned.   (UPDATE: Case is no longer on the OR schedule for 06/02/18.)  VS: BP (!) 150/72   Pulse (!) 59   Temp 36.7 C   Resp 18   Ht 5' 3"  (1.6 m)   Wt 139 lb 3.2 oz (63.1 kg)   SpO2 96%   BMI 24.66 kg/m   PROVIDERS: [name], MD is PCP - Mertie Moores, MD is cardiologist. Last visit 03/09/18. Corliss Parish, MD is nephrologist. 05/07/18 office visit is scanned under Media tab. CKD felt due to age-related nephrosclerosis and cardiomyopathy. At that point, patient was leaning towards not pursing renal replacement therapy if CKD worsened, but wanted to further discuss with her family. If patient ultimately decided she would not pursue dialysis, then Dr. Moshe Cipro planned to inform patient's cancer team to see if that would influence patient's treatment course.   Jana Hakim, Sarajane Jews, MD is HEM-ONC.   Eppie Gibson, MD is  RAD-ONC  LABS: Preoperative labs noted. BUN 68, Cr 4.10, which is consistent with labs since at least 02/2018. H/H 11.0/36.8. PLT 171. A1c 6.2.  (all labs ordered are listed, but only abnormal results are displayed)  Labs Reviewed  GLUCOSE, CAPILLARY - Abnormal; Notable for the following components:      Result Value   Glucose-Capillary 157 (*)    All other components within normal limits  HEMOGLOBIN A1C - Abnormal; Notable for the  following components:   Hgb A1c MFr Bld 6.2 (*)    All other components within normal limits  BASIC METABOLIC PANEL - Abnormal; Notable for the following components:   Glucose, Bld 161 (*)    BUN 68 (*)    Creatinine, Ser 4.10 (*)    GFR calc non Af Amer 9 (*)    GFR calc Af Amer 10 (*)    All other components within normal limits  CBC - Abnormal; Notable for the following components:   Hemoglobin 11.0 (*)    MCHC 29.9 (*)    RDW 16.1 (*)    All other components within normal limits     IMAGES: CXR 01/31/18 (done during CHF hospitalization):  IMPRESSION: 1. Cardiomegaly and mild edema/pulmonary venous congestion. 2. A nodular density over the lateral right lung base may represent a nipple shadow. Recommend repeat imaging upon resolution of symptoms with nipple markers to ensure resolution.   EKG: EKG 01/31/18: NSR, right BBB, LAFB. Moderate voltage criteria for LVH, may be normal variant. Anteroseptal infarct (age undetermined). EKG stable when compared to 12/02/17 tracing.    CV: Echo 02/01/18: Study Conclusions - Left ventricle: The cavity size was mildly dilated. There was   mild concentric hypertrophy. Systolic function was mildly to   moderately reduced. The estimated ejection fraction was in the   range of 40% to 45%. Akinesis and scarring of the inferolateral   myocardium; consistent with infarction in the distribution of the   left circumflex coronary artery; worse from the study of   07/24/2017. - Mitral valve: There was moderate regurgitation directed   centrally. - Left atrium: The atrium was mildly dilated. - Right atrium: The atrium was mildly dilated. - Atrial septum: No defect or patent foramen ovale was identified. - Pulmonary arteries: Systolic pressure was moderately increased.   PA peak pressure: 52 mm Hg (S). - Pericardium, extracardiac: A trivial pericardial effusion was   identified. Impressions: - The inferolateral wall motion abnormality is more  pronounced and   MR has worsened since last September. (Comparison echo 11/07/12: EF 35-40%, severe MR, PA peak pressure 50 mmHg; 07/24/17: EF 55-60%, mild MR, PA peak pressure 57 mmHg)   Past Medical History:  Diagnosis Date  . Anemia   . CAD (coronary artery disease) 06/26/2014   admx with NSTEMI in 12/13 >> tx medically due to CKD  . Chronic combined systolic and diastolic CHF (congestive heart failure) ([place]) 11/08/2012   Echo 12/13:  EF 35-40, inferior and inferoseptal akinesis, grade 2 diastolic dysfunction, MAC, dysfunction/tethering of the posterior papillary muscle as a result of inferior infarct, severe MR, moderate PI, PASP 50, small circumferential pericardial effusion without hemodynamic compromise  . CKD (chronic kidney disease)   . Diabetes mellitus without complication ([place])    type 2  . Former smoker   . Heart attack ([place])   . Hyperlipidemia   . Hypertension   . Malignant neoplasm of lower-inner quadrant of right breast of female, estrogen receptor positive ([place]) 04/21/2018  . Pulmonary hypertension (  [place]) 11/08/2012  . Severe mitral regurgitation 11/08/2012   Due to papillary muscle dysfunction from prior inf infarct    Past Surgical History:  Procedure Laterality Date  . COLONOSCOPY    . ESOPHAGOGASTRODUODENOSCOPY N/A 07/25/2017   Procedure: ESOPHAGOGASTRODUODENOSCOPY (EGD);  Surgeon: [name], MD;  Location: [facility];  Service: Gastroenterology;  Laterality: N/A;  . ESOPHAGOGASTRODUODENOSCOPY N/A 12/02/2017   Procedure: ESOPHAGOGASTRODUODENOSCOPY (EGD);  Surgeon: Ladene Artist, MD;  Location: [facility] ENDOSCOPY;  Service: Endoscopy;  Laterality: N/A;  . EYE SURGERY      MEDICATIONS: . acetaminophen (TYLENOL) 325 MG tablet  . amLODipine (NORVASC) 10 MG tablet  . aspirin 81 MG chewable tablet  . Calcium Carb-Cholecalciferol (CALTRATE 600+D) 600-800 MG-UNIT TABS  . carvedilol (COREG) 3.125 MG tablet  . dorzolamide-timolol (COSOPT) 22.3-6.8 MG/ML ophthalmic  solution  . feeding supplement, [place], ([place]) LIQD  . furosemide (LASIX) 20 MG tablet  . glipiZIDE (GLUCOTROL) 5 MG tablet  . isosorbide mononitrate (IMDUR) 30 MG 24 hr tablet  . latanoprost (XALATAN) 0.005 % ophthalmic solution  . levothyroxine (SYNTHROID, LEVOTHROID) 50 MCG tablet  . pantoprazole (PROTONIX) 40 MG tablet  . potassium chloride (K-DUR) 10 MEQ tablet  . senna-docusate (SENOKOT-S) 8.6-50 MG tablet  . simvastatin (ZOCOR) 20 MG tablet   No current facility-administered medications for this encounter.    She is on ASA, not Plavix.   George Hugh [facility]/Anesthesiology Phone [phone] 05/28/2018 3:41 PM

## 2018-05-31 ENCOUNTER — Other Ambulatory Visit: Payer: Self-pay | Admitting: Oncology

## 2018-05-31 ENCOUNTER — Telehealth: Payer: Self-pay | Admitting: *Deleted

## 2018-05-31 MED ORDER — ANASTROZOLE 1 MG PO TABS: 1.0000 mg | ORAL_TABLET | Freq: Every day | ORAL | 4 refills | Status: DC

## 2018-05-31 NOTE — Progress Notes (Unsigned)
Patient has end-stage renal disease and is not a dialysis candidate.  She has a very limited life spine.  Accordingly her surgery has been canceled.  We are going to start her on anastrozole.  She already has an appointment with me on June 29, 2018.

## 2018-05-31 NOTE — Telephone Encounter (Signed)
This RN contacted per MD request to inform her post MD communication with her other MDs- best plan for her breast cancer diagnosis is to start on anastrazole.  This RN informed pt goal is to start this pill which can decrease the tumor which will be followed carefully by MD and presently she will not be scheduled for surgery.  Jaylei was pleased with above plan and verbalized understanding as well stating she will start pill this week.  Pt is scheduled for follow up in 1 month.  No further questions or needs at this time.

## 2018-06-01 ENCOUNTER — Encounter: Payer: Self-pay | Admitting: *Deleted

## 2018-06-02 ENCOUNTER — Ambulatory Visit (HOSPITAL_COMMUNITY): Admission: RE | Admit: 2018-06-02 | Payer: Medicare HMO | Source: Ambulatory Visit | Admitting: General Surgery

## 2018-06-02 ENCOUNTER — Encounter (HOSPITAL_COMMUNITY): Admission: RE | Payer: Self-pay | Source: Ambulatory Visit

## 2018-06-02 SURGERY — BREAST LUMPECTOMY WITH RADIOACTIVE SEED LOCALIZATION
Anesthesia: General | Site: Breast | Laterality: Right

## 2018-06-03 ENCOUNTER — Encounter (HOSPITAL_COMMUNITY): Payer: Medicare HMO

## 2018-06-09 ENCOUNTER — Ambulatory Visit (HOSPITAL_COMMUNITY)
Admission: RE | Admit: 2018-06-09 | Discharge: 2018-06-09 | Disposition: A | Discharge status: Home / Self Care | Payer: Medicare HMO | Source: Ambulatory Visit | Attending: Nephrology | Admitting: Nephrology

## 2018-06-09 VITALS — BP 153/60 | HR 62 | Temp 97.9°F | Ht 63.0 in | Wt 140.0 lb

## 2018-06-09 DIAGNOSIS — N184 Chronic kidney disease, stage 4 (severe): Secondary | ICD-10-CM | POA: Diagnosis not present

## 2018-06-09 DIAGNOSIS — I10 Essential (primary) hypertension: Secondary | ICD-10-CM | POA: Diagnosis not present

## 2018-06-09 DIAGNOSIS — D485 Neoplasm of uncertain behavior of skin: Secondary | ICD-10-CM | POA: Diagnosis not present

## 2018-06-09 DIAGNOSIS — D631 Anemia in chronic kidney disease: Secondary | ICD-10-CM | POA: Insufficient documentation

## 2018-06-09 LAB — RENAL FUNCTION PANEL
ALBUMIN: 3.5 g/dL (ref 3.5–5.0)
Anion gap: 10 (ref 5–15)
BUN: 66 mg/dL — AB (ref 8–23)
CO2: 24 mmol/L (ref 22–32)
CREATININE: 4.38 mg/dL — AB (ref 0.44–1.00)
Calcium: 9 mg/dL (ref 8.9–10.3)
Chloride: 107 mmol/L (ref 98–111)
GFR calc Af Amer: 10 mL/min — ABNORMAL LOW (ref >60)
GFR calc non Af Amer: 8 mL/min — ABNORMAL LOW (ref >60)
GLUCOSE: 188 mg/dL — AB (ref 70–99)
PHOSPHORUS: 4.8 mg/dL — AB (ref 2.5–4.6)
Potassium: 3.8 mmol/L (ref 3.5–5.1)
SODIUM: 141 mmol/L (ref 135–145)

## 2018-06-09 LAB — IRON AND TIBC
IRON: 79 ug/dL (ref 28–170)
Saturation Ratios: 32 % — ABNORMAL HIGH (ref 10.4–31.8)
TIBC: 248 ug/dL — ABNORMAL LOW (ref 250–450)
UIBC: 169 ug/dL

## 2018-06-09 LAB — POCT HEMOGLOBIN-HEMACUE: Hemoglobin: 10.3 g/dL — ABNORMAL LOW (ref 12.0–15.0)

## 2018-06-09 LAB — FERRITIN: Ferritin: 443 ng/mL — ABNORMAL HIGH (ref 11–307)

## 2018-06-09 MED ORDER — EPOETIN ALFA 20000 UNIT/ML IJ SOLN
20000.0000 [IU] | INTRAMUSCULAR | Status: DC
  Administered 2018-06-09: 20000 [IU] via SUBCUTANEOUS

## 2018-06-09 MED ORDER — EPOETIN ALFA 20000 UNIT/ML IJ SOLN
INTRAMUSCULAR | Status: AC
Start: 2018-06-09 — End: 2018-06-09
  Administered 2018-06-09: 20000 [IU] via SUBCUTANEOUS
  Filled 2018-06-09: qty 1

## 2018-06-10 LAB — PTH, INTACT AND CALCIUM
Calcium, Total (PTH): 9 mg/dL (ref 8.7–10.3)
PTH: 80 pg/mL — ABNORMAL HIGH (ref 15–65)

## 2018-06-28 NOTE — Progress Notes (Signed)
[facility]  [phone] Fax:([phone]) [phone]     ID: Mandy Larson DOB: 1930-04-06  MR#: [id number]  JJK#:[id number]  Patient Care Team: [name], MD as PCP - General (Internal Medicine) Nahser, Wonda Cheng, MD as PCP - Cardiology (Cardiology) Excell Seltzer, MD as Consulting Physician (General Surgery) Rebacca Votaw, Virgie Dad, MD as Consulting Physician (Oncology) Eppie Gibson, MD as Attending Physician (Radiation Oncology) Corliss Parish, MD as Consulting Physician (Nephrology) Evelina Bucy, DPM as Consulting Physician (Podiatry) Poudyal, Ritesh, OD (Optometry) OTHER MD:   CHIEF COMPLAINT: Estrogen receptor positive breast cancer  CURRENT TREATMENT: anastrozole   HISTORY OF CURRENT ILLNESS: From the original intake note:  Mandy Larson had routine screening mammography on 04/07/2018 showing a possible abnormality in the right breast. She underwent unilateral right diagnostic mammography with tomography at [facility] on 04/09/2018 showing: breast density category D. There is a group of indeterminate calcifications in the lower inner quadrant middle depth of the right breast, spanning 4.7 cm. Ultrasonography of the right breast and axilla on 06/05/ 2019 at [facility] revealed a 1.1 cm hypoechoic mass at the 5 o'clock position  Lower inner quadrant and located 4 cm from the nipple, with associated calcifications. There was no evidence of lymphadenopathy in the right axilla.   Accordingly on 04/14/2018 she proceeded to biopsy of the right breast area in question. The pathology from this procedure showed ([id number]): Invasive ductal carcinoma grade 2, measuring 0.7 cm in greatest extent. Ductal carcinoma in situ, intermediate grade with calcifications. Prognostic indicators significant for: estrogen receptor, 100% posiive and progesterone receptor, 80% positive, both with strong staining intensity. Proliferation marker Ki67 at 10%. HER2 not amplified with  ratios HER2/CEP17 signals 1.11 and average HER2 copies per cell 2.00  The patient's subsequent history is as detailed below.  INTERVAL HISTORY: Mandy Larson returns today for follow up and treatment of her estrogen receptor positive breast cancer.   She was scheduled for right lumpectomy on 06/02/2018, but this was cancelled due to her end-stage renal disease and cardiac concerns. She was started on anastrozole 05/31/2018. She is tolerating this well. She denies issues with hot flashes or vaginal dryness. She obtained this medication at no cost.    REVIEW OF SYSTEMS: Mandy Larson reports that she is having a difficult time with her cancer diagnosis and she isn't entirely sure why she wasn't able to have her lumpectomy. In a normal day. She is able to complete her own house chores, grocery shopping, and driving. One of her sons lives at home with her. She denies unusual headaches, visual changes, nausea, vomiting, or dizziness. There has been no unusual cough, phlegm production, or pleurisy. There has been no change in bowel or bladder habits. She denies unexplained fatigue or unexplained weight loss, bleeding, rash, or fever. A detailed review of systems was otherwise stable.    PAST MEDICAL HISTORY: Past Medical History:  Diagnosis Date  . Anemia   . CAD (coronary artery disease) 06/26/2014   admx with NSTEMI in 12/13 >> tx medically due to CKD  . Chronic combined systolic and diastolic CHF (congestive heart failure) ([place]) 11/08/2012   Echo 12/13:  EF 35-40, inferior and inferoseptal akinesis, grade 2 diastolic dysfunction, MAC, dysfunction/tethering of the posterior papillary muscle as a result of inferior infarct, severe MR, moderate PI, PASP 50, small circumferential pericardial effusion without hemodynamic compromise  . CKD (chronic kidney disease)   . Diabetes mellitus without complication ([place])    type 2  . Former smoker   . Heart attack (  [place])   . Hyperlipidemia   . Hypertension   .  Malignant neoplasm of lower-inner quadrant of right breast of female, estrogen receptor positive ([place]) 04/21/2018  . Pulmonary hypertension ([place]) 11/08/2012  . Severe mitral regurgitation 11/08/2012   Due to papillary muscle dysfunction from prior inf infarct  MI 2013  PAST SURGICAL HISTORY: Past Surgical History:  Procedure Laterality Date  . COLONOSCOPY    . ESOPHAGOGASTRODUODENOSCOPY N/A 07/25/2017   Procedure: ESOPHAGOGASTRODUODENOSCOPY (EGD);  Surgeon: [name], MD;  Location: [facility];  Service: Gastroenterology;  Laterality: N/A;  . ESOPHAGOGASTRODUODENOSCOPY N/A 12/02/2017   Procedure: ESOPHAGOGASTRODUODENOSCOPY (EGD);  Surgeon: Ladene Artist, MD;  Location: [facility] ENDOSCOPY;  Service: Endoscopy;  Laterality: N/A;  . EYE SURGERY      FAMILY HISTORY Family History  Problem Relation Age of Onset  . Hypertension Father   The patient's father died at age 91 due to MI. The patient's mother died at age 58 also due to MI. The patient had 2 brothers and no sisters. She denies a family history of breast or ovarian cancer.   GYNECOLOGIC HISTORY:  No LMP recorded. Patient is postmenopausal. Menarche: 84 years old Age at first live birth: 84 years old She is GX P2. Her LMP was in her late 66's. She never used contraception or HRT.   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 She is widowed. At home is the patient's son, Mandy Larson, who is retired from working at [employer] and is in fair health. The patient's second son, Mandy Larson lives in [place] and works for [employer] in the nursing department (is not a Marine scientist). The patient has 1 grandchild. The patient attends [employer].      ADVANCED DIRECTIVES: Not in Place.  At the 04/21/2018 visit the patient was given the appropriate documents to complete and notarized at her discretion. She is planning to name her son, Mandy Larson as healthcare power of attorney   HEALTH MAINTENANCE: Social  History   Tobacco Use  . Smoking status: Former Smoker    Types: Cigarettes    Last attempt to quit: 11/08/1993    Years since quitting: 24.6  . Smokeless tobacco: Never Used  Substance Use Topics  . Alcohol use: No  . Drug use: No     Colonoscopy: 12/02/2017/ Dr. Fuller Plan Small hiatal hernia, gastric ulcer  PAP: 2018  Bone density:   No Known Allergies  Current Outpatient Medications  Medication Sig Dispense Refill  . acetaminophen (TYLENOL) 325 MG tablet Take 325 mg by mouth every 6 (six) hours as needed for mild pain or headache.     Marland Kitchen amLODipine (NORVASC) 10 MG tablet Take 10 mg by mouth daily.  4  . anastrozole (ARIMIDEX) 1 MG tablet Take 1 tablet (1 mg total) by mouth daily. 90 tablet 4  . aspirin 81 MG chewable tablet Chew 81 mg by mouth daily.    . Calcium Carb-Cholecalciferol (CALTRATE 600+D) 600-800 MG-UNIT TABS Take 1 tablet by mouth daily.    . carvedilol (COREG) 3.125 MG tablet Take 1 tablet (3.125 mg total) by mouth 2 (two) times daily with a meal. 180 tablet 3  . dorzolamide-timolol (COSOPT) 22.3-6.8 MG/ML ophthalmic solution Place 1 drop into both eyes 2 (two) times daily.     . feeding supplement, GLUCERNA SHAKE, (GLUCERNA SHAKE) LIQD Take 237 mLs by mouth 2 (two) times daily between meals. (Patient not taking: Reported on 05/25/2018)  0  . furosemide (LASIX) 20 MG tablet  Take 1 tablet (20 mg total) by mouth 2 (two) times daily. 60 tablet 0  . glipiZIDE (GLUCOTROL) 5 MG tablet Take 2.5 mg by mouth daily before breakfast.     . isosorbide mononitrate (IMDUR) 30 MG 24 hr tablet Take 1 tablet (30 mg total) by mouth daily. 30 tablet 5  . latanoprost (XALATAN) 0.005 % ophthalmic solution Place 1 drop into both eyes at bedtime.     Marland Kitchen levothyroxine (SYNTHROID, LEVOTHROID) 50 MCG tablet Take 50 mcg by mouth daily.    . pantoprazole (PROTONIX) 40 MG tablet Take 1 tablet (40 mg total) by mouth daily. 60 tablet 0  . potassium chloride (K-DUR) 10 MEQ tablet Take 1 tablet (10 mEq  total) by mouth daily. (Patient not taking: Reported on 05/25/2018) 30 tablet 0  . senna-docusate (SENOKOT-S) 8.6-50 MG tablet Take 1 tablet by mouth at bedtime. (Patient not taking: Reported on 05/25/2018) 30 tablet 0  . simvastatin (ZOCOR) 20 MG tablet Take 20 mg by mouth every evening.     No current facility-administered medications for this visit.     OBJECTIVE: Older African-American woman in no acute distress  Vitals:   06/29/18 1009  BP: 137/62  Pulse: 61  Resp: 18  Temp: 98 F (36.7 C)  SpO2: 98%     Body mass index is 24.53 kg/m.   Wt Readings from Last 3 Encounters:  06/29/18 138 lb 8 oz (62.8 kg)  06/09/18 140 lb (63.5 kg)  05/27/18 139 lb 3.2 oz (63.1 kg)      ECOG FS:0 - Asymptomatic  Sclerae unicteric, EOMs intact No cervical or supraclavicular adenopathy Lungs no rales or rhonchi Heart regular rate and rhythm Abd soft, nontender, positive bowel sounds MSK no focal spinal tenderness, no upper extremity lymphedema Neuro: nonfocal, well oriented, appropriate affect Breasts: There continues to be a hard movable palpable mass in the lower inner quadrant of the right breast measuring less than a centimeter.  There is no associated skin change or nipple change.  The left breast is benign.  Both axillae are benign.  LAB RESULTS:  CMP     Component Value Date/Time   NA 141 06/09/2018 0937   NA 140 02/10/2018 0823   K 3.8 06/09/2018 0937   CL 107 06/09/2018 0937   CO2 24 06/09/2018 0937   GLUCOSE 188 (H) 06/09/2018 0937   BUN 66 (H) 06/09/2018 0937   BUN 59 (H) 02/10/2018 0823   CREATININE 4.38 (H) 06/09/2018 0937   CREATININE 4.03 (HH) 04/21/2018 0800   CREATININE 2.89 (H) 07/07/2016 1019   CALCIUM 9.0 06/09/2018 0937   CALCIUM 9.0 06/09/2018 0937   PROT 7.6 04/21/2018 0800   PROT 6.3 07/29/2017 0828   ALBUMIN 3.5 06/09/2018 0937   ALBUMIN 3.7 07/29/2017 0828   AST 23 04/21/2018 0800   ALT 12 04/21/2018 0800   ALKPHOS 67 04/21/2018 0800   BILITOT 0.4  04/21/2018 0800   GFRNONAA 8 (L) 06/09/2018 0937   GFRNONAA 9 (L) 04/21/2018 0800   GFRAA 10 (L) 06/09/2018 0937   GFRAA 11 (L) 04/21/2018 0800    No results found for: TOTALPROTELP, ALBUMINELP, A1GS, A2GS, BETS, BETA2SER, GAMS, MSPIKE, SPEI  No results found for: KPAFRELGTCHN, LAMBDASER, KAPLAMBRATIO  Lab Results  Component Value Date   WBC 4.7 05/27/2018   NEUTROABS 3.1 04/21/2018   HGB 10.3 (L) 06/09/2018   HCT 36.8 05/27/2018   MCV 88.9 05/27/2018   PLT 171 05/27/2018    @LASTCHEMISTRY @  No results found for: LABCA2  No components found for: WUJWJX914  No results for input(s): INR in the last 168 hours.  No results found for: LABCA2  No results found for: NWG956  No results found for: OZH086  No results found for: VHQ469  No results found for: CA2729  No components found for: HGQUANT  No results found for: CEA1 / No results found for: CEA1   No results found for: AFPTUMOR  No results found for: CHROMOGRNA  No results found for: PSA1  No visits with results within 3 Day(s) from this visit.  Latest known visit with results is:  Hospital Outpatient Visit on 06/09/2018  Component Date Value Ref Range Status  . Sodium 06/09/2018 141  135 - 145 mmol/L Final  . Potassium 06/09/2018 3.8  3.5 - 5.1 mmol/L Final  . Chloride 06/09/2018 107  98 - 111 mmol/L Final  . CO2 06/09/2018 24  22 - 32 mmol/L Final  . Glucose, Bld 06/09/2018 188* 70 - 99 mg/dL Final  . BUN 06/09/2018 66* 8 - 23 mg/dL Final  . Creatinine, Ser 06/09/2018 4.38* 0.44 - 1.00 mg/dL Final  . Calcium 06/09/2018 9.0  8.9 - 10.3 mg/dL Final  . Phosphorus 06/09/2018 4.8* 2.5 - 4.6 mg/dL Final  . Albumin 06/09/2018 3.5  3.5 - 5.0 g/dL Final  . GFR calc non Af Amer 06/09/2018 8* >60 mL/min Final  . GFR calc Af Amer 06/09/2018 10* >60 mL/min Final   Comment: (NOTE) The eGFR has been calculated using the CKD EPI equation. This calculation has not been validated in all clinical situations. eGFR's  persistently <60 mL/min signify possible Chronic Kidney Disease.   . Anion gap 06/09/2018 10  5 - 15 Final   Performed at [facility], [place] [address] [place], [place] [address]  . Iron 06/09/2018 79  28 - 170 ug/dL Final  . TIBC 06/09/2018 248* 250 - 450 ug/dL Final  . Saturation Ratios 06/09/2018 32* 10.4 - 31.8 % Final  . UIBC 06/09/2018 169  ug/dL Final   Performed at [facility] [address], [place], [place] [address]  . Ferritin 06/09/2018 443* 11 - 307 ng/mL Final   Performed at [facility], [place] [address] [place], [place] [address]  . PTH 06/09/2018 80* 15 - 65 pg/mL Final  . Calcium, Total (PTH) 06/09/2018 9.0  8.7 - 10.3 mg/dL Final  . PTH Interp 06/09/2018 Comment   Final   Comment: (NOTE) Interpretation                 Intact PTH    Calcium                                (pg/mL)      (mg/dL) Normal                          15 - 65     8.6 - 10.2 Primary Hyperparathyroidism         >65          >10.2 Secondary Hyperparathyroidism       >65          <10.2 Non-Parathyroid Hypercalcemia       <65          >10.2 Hypoparathyroidism                  <15          <  8.6 Non-Parathyroid Hypocalcemia    15 - 65          < 8.6 Performed At: [facility] [place], [place] [id number] Rush Farmer MD XN:[id number]   . Hemoglobin 06/09/2018 10.3* 12.0 - 15.0 g/dL Final    (this displays the last labs from the last 3 days)  No results found for: TOTALPROTELP, ALBUMINELP, A1GS, A2GS, BETS, BETA2SER, GAMS, MSPIKE, SPEI (this displays SPEP labs)  No results found for: KPAFRELGTCHN, LAMBDASER, KAPLAMBRATIO (kappa/lambda light chains)  No results found for: HGBA, HGBA2QUANT, HGBFQUANT, HGBSQUAN (Hemoglobinopathy evaluation)   No results found for: LDH  Lab Results  Component Value Date   IRON 79 06/09/2018   TIBC 248 (L) 06/09/2018   IRONPCTSAT 32 (H) 06/09/2018   (Iron and TIBC)  Lab Results  Component Value Date   FERRITIN  443 (H) 06/09/2018    Urinalysis    Component Value Date/Time   COLORURINE YELLOW 12/01/2017 1437   APPEARANCEUR HAZY (A) 12/01/2017 1437   LABSPEC 1.013 12/01/2017 1437   PHURINE 5.0 12/01/2017 1437   GLUCOSEU NEGATIVE 12/01/2017 1437   HGBUR LARGE (A) 12/01/2017 [facility] 12/01/2017 1437   [place] 12/01/2017 1437   PROTEINUR 30 (A) 12/01/2017 1437   NITRITE NEGATIVE 12/01/2017 1437   LEUKOCYTESUR TRACE (A) 12/01/2017 1437     STUDIES: No results found.   ELIGIBLE FOR AVAILABLE RESEARCH PROTOCOL: no  ASSESSMENT: 84 y.o. [place], [place] woman is post right breast lower inner quadrant biopsy 04/14/2018 for a clinical T1-2 N0, stage I invasive ductal carcinoma, grade 2, estrogen and progesterone receptor strongly positive, HER-2 not amplified, with an MIB-1 of 10%  (1) patient was not considered to be a good surgical candidate because of renal and cardiac concerns  (2) anastrozole started 05/31/2018  (a) right breast lower inner quadrant mass measured 1.1 cm on ultrasound 04/14/2018  PLAN: Ms. Rosch was very distressed because she had cancer and was not undergoing surgery.  We reviewed the reasons why her surgery has been postponed and she understands that it may be postponed indefinitely.  We reviewed the fact that her tumor is estrogen dependent and that she is being treated with an antiestrogens.  Many patients do remarkably well simply on antiestrogens and did not require any intervention for many years.  This does mean we have to follow her closely and I am setting her up for right breast mammography and ultrasonography February 2020 at [facility].  Incidentally she will have a DEXA scan at the same time.  She will then see me shortly after that test.  If the tumor is stable or improved then we will continue the anastrozole.  If there has been evidence of tumor growth then the idea of surgery, possibly under local anesthesia, could be  reconsidered  She has a good understanding of this plan and is in agreement with it.  I Larson her a healthcare power of attorney form today to complete and notarized at her discretion.    Mandy Larson, Virgie Dad, MD  06/29/18 10:26 AM Medical Oncology and Hematology [facility] [address] [place], [place] [address] Tel. [phone]    Fax. [phone]  Alice Rieger, am acting as scribe for Chauncey Cruel MD.  I, Lurline Del MD, have reviewed the above documentation for accuracy and completeness, and I agree with the above.

## 2018-06-29 ENCOUNTER — Telehealth: Payer: Self-pay | Admitting: Oncology

## 2018-06-29 ENCOUNTER — Inpatient Hospital Stay: Payer: Medicare HMO | Attending: Oncology | Admitting: Oncology

## 2018-06-29 VITALS — BP 137/62 | HR 61 | Temp 98.0°F | Resp 18 | Ht 63.0 in | Wt 138.5 lb

## 2018-06-29 DIAGNOSIS — Z87891 Personal history of nicotine dependence: Secondary | ICD-10-CM | POA: Insufficient documentation

## 2018-06-29 DIAGNOSIS — Z170 Estrogen receptor positive status [ER+]: Secondary | ICD-10-CM | POA: Diagnosis not present

## 2018-06-29 DIAGNOSIS — C50311 Malignant neoplasm of lower-inner quadrant of right female breast: Secondary | ICD-10-CM | POA: Insufficient documentation

## 2018-06-29 DIAGNOSIS — N186 End stage renal disease: Secondary | ICD-10-CM | POA: Insufficient documentation

## 2018-06-29 MED ORDER — ANASTROZOLE 1 MG PO TABS: 1.0000 mg | ORAL_TABLET | Freq: Every day | ORAL | 4 refills | Status: AC

## 2018-06-29 NOTE — Telephone Encounter (Signed)
Gave patient AVS and calendar.  Patient aware of appts needed with [facility].  Faxed orders to [facility].

## 2018-07-07 ENCOUNTER — Encounter (HOSPITAL_COMMUNITY)
Admission: RE | Admit: 2018-07-07 | Discharge: 2018-07-07 | Disposition: A | Discharge status: Home / Self Care | Payer: Medicare HMO | Source: Ambulatory Visit | Attending: Nephrology | Admitting: Nephrology

## 2018-07-07 VITALS — BP 141/60 | HR 57 | Temp 97.9°F

## 2018-07-07 DIAGNOSIS — D631 Anemia in chronic kidney disease: Secondary | ICD-10-CM | POA: Diagnosis present

## 2018-07-07 DIAGNOSIS — N184 Chronic kidney disease, stage 4 (severe): Secondary | ICD-10-CM | POA: Diagnosis not present

## 2018-07-07 LAB — RENAL FUNCTION PANEL
ANION GAP: 13 (ref 5–15)
Albumin: 3.9 g/dL (ref 3.5–5.0)
BUN: 75 mg/dL — ABNORMAL HIGH (ref 8–23)
CO2: 24 mmol/L (ref 22–32)
Calcium: 9.6 mg/dL (ref 8.9–10.3)
Chloride: 104 mmol/L (ref 98–111)
Creatinine, Ser: 4.48 mg/dL — ABNORMAL HIGH (ref 0.44–1.00)
GFR calc non Af Amer: 8 mL/min — ABNORMAL LOW (ref >60)
GFR, EST AFRICAN AMERICAN: 9 mL/min — AB (ref >60)
GLUCOSE: 148 mg/dL — AB (ref 70–99)
POTASSIUM: 3.9 mmol/L (ref 3.5–5.1)
Phosphorus: 5.3 mg/dL — ABNORMAL HIGH (ref 2.5–4.6)
Sodium: 141 mmol/L (ref 135–145)

## 2018-07-07 LAB — POCT HEMOGLOBIN-HEMACUE: Hemoglobin: 11.1 g/dL — ABNORMAL LOW (ref 12.0–15.0)

## 2018-07-07 LAB — IRON AND TIBC
IRON: 99 ug/dL (ref 28–170)
SATURATION RATIOS: 36 % — AB (ref 10.4–31.8)
TIBC: 276 ug/dL (ref 250–450)
UIBC: 177 ug/dL

## 2018-07-07 LAB — FERRITIN: Ferritin: 590 ng/mL — ABNORMAL HIGH (ref 11–307)

## 2018-07-07 MED ORDER — EPOETIN ALFA 20000 UNIT/ML IJ SOLN
20000.0000 [IU] | INTRAMUSCULAR | Status: DC
  Administered 2018-07-07: 20000 [IU] via SUBCUTANEOUS

## 2018-07-07 MED ORDER — EPOETIN ALFA 20000 UNIT/ML IJ SOLN
INTRAMUSCULAR | Status: AC
  Administered 2018-07-07: 20000 [IU] via SUBCUTANEOUS
  Filled 2018-07-07: qty 1

## 2018-07-08 LAB — PTH, INTACT AND CALCIUM
CALCIUM TOTAL (PTH): 9.7 mg/dL (ref 8.7–10.3)
PTH: 76 pg/mL — ABNORMAL HIGH (ref 15–65)

## 2018-07-14 DIAGNOSIS — R69 Illness, unspecified: Secondary | ICD-10-CM | POA: Diagnosis not present

## 2018-07-29 DIAGNOSIS — I509 Heart failure, unspecified: Secondary | ICD-10-CM | POA: Diagnosis not present

## 2018-07-29 DIAGNOSIS — D631 Anemia in chronic kidney disease: Secondary | ICD-10-CM | POA: Diagnosis not present

## 2018-07-29 DIAGNOSIS — N184 Chronic kidney disease, stage 4 (severe): Secondary | ICD-10-CM | POA: Diagnosis not present

## 2018-07-29 DIAGNOSIS — I110 Hypertensive heart disease with heart failure: Secondary | ICD-10-CM | POA: Diagnosis not present

## 2018-07-30 ENCOUNTER — Ambulatory Visit: Payer: Medicare HMO | Admitting: Podiatry

## 2018-07-30 DIAGNOSIS — E1151 Type 2 diabetes mellitus with diabetic peripheral angiopathy without gangrene: Secondary | ICD-10-CM

## 2018-07-30 DIAGNOSIS — B351 Tinea unguium: Secondary | ICD-10-CM

## 2018-07-30 NOTE — Progress Notes (Signed)
Subjective:  Patient ID: Mandy Larson, female    DOB: 30-Jul-1930,  MRN: [id number]  Chief Complaint  Patient presents with  . Nail Problem    3 month nail trim    84 y.o. female presents  for diabetic foot care. Last AMBS was unknown. Denies numbness and tingling in their feet. Denies cramping in legs and thighs.  Review of Systems: Negative except as noted in the HPI. Denies N/V/F/Ch.  Past Medical History:  Diagnosis Date  . Anemia   . CAD (coronary artery disease) 06/26/2014   admx with NSTEMI in 12/13 >> tx medically due to CKD  . Chronic combined systolic and diastolic CHF (congestive heart failure) ([place]) 11/08/2012   Echo 12/13:  EF 35-40, inferior and inferoseptal akinesis, grade 2 diastolic dysfunction, MAC, dysfunction/tethering of the posterior papillary muscle as a result of inferior infarct, severe MR, moderate PI, PASP 50, small circumferential pericardial effusion without hemodynamic compromise  . CKD (chronic kidney disease)   . Diabetes mellitus without complication ([place])    type 2  . Former smoker   . Heart attack ([place])   . Hyperlipidemia   . Hypertension   . Malignant neoplasm of lower-inner quadrant of right breast of female, estrogen receptor positive ([place]) 04/21/2018  . Pulmonary hypertension ([place]) 11/08/2012  . Severe mitral regurgitation 11/08/2012   Due to papillary muscle dysfunction from prior inf infarct    Current Outpatient Medications:  .  acetaminophen (TYLENOL) 325 MG tablet, Take 325 mg by mouth every 6 (six) hours as needed for mild pain or headache. , Disp: , Rfl:  .  amLODipine (NORVASC) 10 MG tablet, Take 10 mg by mouth daily., Disp: , Rfl: 4 .  anastrozole (ARIMIDEX) 1 MG tablet, Take 1 tablet (1 mg total) by mouth daily., Disp: 90 tablet, Rfl: 4 .  aspirin 81 MG chewable tablet, Chew 81 mg by mouth daily., Disp: , Rfl:  .  Calcium Carb-Cholecalciferol (CALTRATE 600+D) 600-800 MG-UNIT TABS, Take 1 tablet by mouth daily., Disp: , Rfl:  .   carvedilol (COREG) 3.125 MG tablet, Take 1 tablet (3.125 mg total) by mouth 2 (two) times daily with a meal., Disp: 180 tablet, Rfl: 3 .  dorzolamide-timolol (COSOPT) 22.3-6.8 MG/ML ophthalmic solution, Place 1 drop into both eyes 2 (two) times daily. , Disp: , Rfl:  .  feeding supplement, GLUCERNA SHAKE, (GLUCERNA SHAKE) LIQD, Take 237 mLs by mouth 2 (two) times daily between meals. (Patient not taking: Reported on 05/25/2018), Disp: , Rfl: 0 .  furosemide (LASIX) 20 MG tablet, Take 1 tablet (20 mg total) by mouth 2 (two) times daily., Disp: 60 tablet, Rfl: 0 .  glipiZIDE (GLUCOTROL) 5 MG tablet, Take 2.5 mg by mouth daily before breakfast. , Disp: , Rfl:  .  isosorbide mononitrate (IMDUR) 30 MG 24 hr tablet, Take 1 tablet (30 mg total) by mouth daily., Disp: 30 tablet, Rfl: 5 .  latanoprost (XALATAN) 0.005 % ophthalmic solution, Place 1 drop into both eyes at bedtime. , Disp: , Rfl:  .  levothyroxine (SYNTHROID, LEVOTHROID) 50 MCG tablet, Take 50 mcg by mouth daily., Disp: , Rfl:  .  pantoprazole (PROTONIX) 40 MG tablet, Take 1 tablet (40 mg total) by mouth daily., Disp: 60 tablet, Rfl: 0 .  potassium chloride (K-DUR) 10 MEQ tablet, Take 1 tablet (10 mEq total) by mouth daily. (Patient not taking: Reported on 05/25/2018), Disp: 30 tablet, Rfl: 0 .  senna-docusate (SENOKOT-S) 8.6-50 MG tablet, Take 1 tablet by mouth at bedtime. (Patient not  taking: Reported on 05/25/2018), Disp: 30 tablet, Rfl: 0 .  simvastatin (ZOCOR) 20 MG tablet, Take 20 mg by mouth every evening., Disp: , Rfl:   Social History   Tobacco Use  Smoking Status Former Smoker  . Types: Cigarettes  . Last attempt to quit: 11/08/1993  . Years since quitting: 24.7  Smokeless Tobacco Never Used    No Known Allergies Objective:  There were no vitals filed for this visit. There is no height or weight on file to calculate BMI. Constitutional Well developed. Well nourished.  Vascular Dorsalis pedis pulses present 1+ bilaterally    Posterior tibial pulses absent bilaterally  Pedal hair growth absent. Capillary refill normal to all digits.  No cyanosis or clubbing noted.  Neurologic Normal speech. Oriented to person, place, and time. Epicritic sensation to light touch grossly present bilaterally. Protective sensation with 5.07 monofilament  present bilaterally. Vibratory sensation present bilaterally.  Dermatologic Nails elongated, thickened, dystrophic. No open wounds. No skin lesions.  Orthopedic: Normal joint ROM without pain or crepitus bilaterally. No visible deformities. No bony tenderness.   Assessment:   1. Diabetes mellitus type 2 with peripheral artery disease ([place])   2. Onychomycosis    Plan:  Patient was evaluated and treated and all questions answered.  Diabetes with PD, Onychomycosis -Educated on diabetic footcare. Diabetic risk level 1 -Nails x10 debrided sharply and manually with large nail nipper and rotary burr.   Procedure: Nail Debridement Rationale: Patient meets criteria for routine foot care due to PAD Type of Debridement: manual, sharp debridement. Instrumentation: Nail nipper, rotary burr. Number of Nails: 10  Return in about 4 months (around 11/29/2018) for Diabetic Foot Care.

## 2018-08-04 ENCOUNTER — Ambulatory Visit (HOSPITAL_COMMUNITY)
Admission: RE | Admit: 2018-08-04 | Discharge: 2018-08-04 | Disposition: A | Discharge status: Home / Self Care | Payer: Medicare HMO | Source: Ambulatory Visit | Attending: Nephrology | Admitting: Nephrology

## 2018-08-04 VITALS — BP 140/59 | HR 57 | Temp 97.7°F | Resp 20

## 2018-08-04 DIAGNOSIS — N184 Chronic kidney disease, stage 4 (severe): Secondary | ICD-10-CM | POA: Insufficient documentation

## 2018-08-04 DIAGNOSIS — D631 Anemia in chronic kidney disease: Secondary | ICD-10-CM | POA: Insufficient documentation

## 2018-08-04 LAB — IRON AND TIBC
Iron: 95 ug/dL (ref 28–170)
Saturation Ratios: 36 % — ABNORMAL HIGH (ref 10.4–31.8)
TIBC: 266 ug/dL (ref 250–450)
UIBC: 171 ug/dL

## 2018-08-04 LAB — RENAL FUNCTION PANEL
ALBUMIN: 3.6 g/dL (ref 3.5–5.0)
Anion gap: 12 (ref 5–15)
BUN: 81 mg/dL — AB (ref 8–23)
CALCIUM: 9.1 mg/dL (ref 8.9–10.3)
CO2: 24 mmol/L (ref 22–32)
CREATININE: 4.77 mg/dL — AB (ref 0.44–1.00)
Chloride: 108 mmol/L (ref 98–111)
GFR calc Af Amer: 9 mL/min — ABNORMAL LOW (ref >60)
GFR calc non Af Amer: 7 mL/min — ABNORMAL LOW (ref >60)
GLUCOSE: 127 mg/dL — AB (ref 70–99)
POTASSIUM: 3.8 mmol/L (ref 3.5–5.1)
Phosphorus: 4.7 mg/dL — ABNORMAL HIGH (ref 2.5–4.6)
SODIUM: 144 mmol/L (ref 135–145)

## 2018-08-04 LAB — FERRITIN: FERRITIN: 444 ng/mL — AB (ref 11–307)

## 2018-08-04 LAB — POCT HEMOGLOBIN-HEMACUE: Hemoglobin: 9.9 g/dL — ABNORMAL LOW (ref 12.0–15.0)

## 2018-08-04 MED ORDER — EPOETIN ALFA 20000 UNIT/ML IJ SOLN
20000.0000 [IU] | INTRAMUSCULAR | Status: DC
  Administered 2018-08-04: 20000 [IU] via SUBCUTANEOUS

## 2018-08-04 MED ORDER — EPOETIN ALFA 20000 UNIT/ML IJ SOLN
INTRAMUSCULAR | Status: AC
  Filled 2018-08-04: qty 1

## 2018-08-05 LAB — PTH, INTACT AND CALCIUM
Calcium, Total (PTH): 9 mg/dL (ref 8.7–10.3)
PTH: 93 pg/mL — AB (ref 15–65)

## 2018-08-09 ENCOUNTER — Ambulatory Visit: Payer: Medicare HMO | Admitting: Physician Assistant

## 2018-08-09 ENCOUNTER — Encounter: Payer: Self-pay | Admitting: Physician Assistant

## 2018-08-09 VITALS — BP 148/60 | HR 66 | Ht 63.0 in | Wt 140.0 lb

## 2018-08-09 DIAGNOSIS — I130 Hypertensive heart and chronic kidney disease with heart failure and stage 1 through stage 4 chronic kidney disease, or unspecified chronic kidney disease: Secondary | ICD-10-CM

## 2018-08-09 DIAGNOSIS — N184 Chronic kidney disease, stage 4 (severe): Secondary | ICD-10-CM

## 2018-08-09 DIAGNOSIS — I2510 Atherosclerotic heart disease of native coronary artery without angina pectoris: Secondary | ICD-10-CM | POA: Diagnosis not present

## 2018-08-09 DIAGNOSIS — I5042 Chronic combined systolic (congestive) and diastolic (congestive) heart failure: Secondary | ICD-10-CM | POA: Diagnosis not present

## 2018-08-09 DIAGNOSIS — I340 Nonrheumatic mitral (valve) insufficiency: Secondary | ICD-10-CM

## 2018-08-09 NOTE — Patient Instructions (Signed)
Medication Instructions:   Your physician recommends that you continue on your current medications as directed. Please refer to the Current Medication list given to you today.  If you need a refill on your cardiac medications before your next appointment, please call your pharmacy.  Labwork: NONE ORDERED  TODAY     Testing/Procedures: NONE ORDERED  TODAY    Follow-Up: Your physician wants you to follow-up in:  [place] will receive a reminder letter in the mail two months in advance. If you don't receive a letter, please call our office to schedule the follow-up appointment.       Any Other Special Instructions Will Be Listed Below (If Applicable).

## 2018-08-09 NOTE — Progress Notes (Signed)
Cardiology Office Note:    Date:  08/09/2018   ID:  Mandy Larson, DOB 19-Nov-1929, MRN [id number]  PCP:  [name], MD  Cardiologist:  Mertie Moores, MD   Electrophysiologist:  None   Referring MD: [name], MD   Chief Complaint  Patient presents with  . Follow-up    CAD, CHF     History of Present Illness:    Mandy Larson is a 84 y.o. female with CAD, chronic combined systolic and diastolic CHF, severe mitral regurgitation, HTN, HL, diabetes, CKD, prior GI bleed. The patient was admitted with an acute coronary syndrome in 12/13. An echocardiogram demonstrated an EF of 35% with inferior akinesis and severe mitral regurgitation and moderate pulmonary hypertension. Cardiac catheterization was not performed secondary to CKD. She has been managed medically.  She was last seen by Dr. Acie Fredrickson in April 2019.  She was dx with breast cancer in 04/2018.     Mandy Larson returns for follow up. She is here alone.  She has not had any chest pain, syncope, paroxysmal nocturnal dyspnea, leg swelling. She does not have significant shortness of breath.    Prior CV studies:   The following studies were reviewed today:  Echo 02/01/2018 Mild concentric LVH, EF 40-45, inferolateral akinesis, moderate MR, mild BAE, PASP 52, trivial pericardial effusion  Echo 07/24/2017 Mild focal basal septal hypertrophy, EF 55-60, normal wall motion, grade 1 diastolic dysfunction, mild MR, moderate to severe LAE, normal RVSF, PASP 57, trivial pericardial effusion  Echo 12/13 EF 35-40, inferior and inferoseptal akinesis, grade 2 diastolic dysfunction, MAC, dysfunction/tethering of the posterior papillary muscle as a result of inferior infarct, severe MR, moderate PI, PASP 50, small circumferential pericardial effusion without hemodynamic compromise  Past Medical History:  Diagnosis Date  . Anemia   . CAD (coronary artery disease) 06/26/2014   admx with NSTEMI in 12/13 >> tx medically due to CKD  . Chronic  combined systolic and diastolic CHF (congestive heart failure) ([place]) 11/08/2012   Echo 12/13:  EF 35-40, inferior and inferoseptal akinesis, grade 2 diastolic dysfunction, MAC, dysfunction/tethering of the posterior papillary muscle as a result of inferior infarct, severe MR, moderate PI, PASP 50, small circumferential pericardial effusion without hemodynamic compromise  . CKD (chronic kidney disease)   . Diabetes mellitus without complication ([place])    type 2  . Former smoker   . Heart attack ([place])   . Hyperlipidemia   . Hypertension   . Malignant neoplasm of lower-inner quadrant of right breast of female, estrogen receptor positive ([place]) 04/21/2018  . Pulmonary hypertension ([place]) 11/08/2012  . Severe mitral regurgitation 11/08/2012   Due to papillary muscle dysfunction from prior inf infarct   Surgical Hx: The patient  has a past surgical history that includes Eye surgery; Esophagogastroduodenoscopy (N/A, 07/25/2017); Esophagogastroduodenoscopy (N/A, 12/02/2017); and Colonoscopy.   Current Medications: Current Meds  Medication Sig  . acetaminophen (TYLENOL) 325 MG tablet Take 325 mg by mouth every 6 (six) hours as needed for mild pain or headache.   Marland Kitchen amLODipine (NORVASC) 10 MG tablet Take 10 mg by mouth daily.  Marland Kitchen anastrozole (ARIMIDEX) 1 MG tablet Take 1 tablet (1 mg total) by mouth daily.  Marland Kitchen aspirin 81 MG chewable tablet Chew 81 mg by mouth daily.  . Calcium Carb-Cholecalciferol (CALTRATE 600+D) 600-800 MG-UNIT TABS Take 1 tablet by mouth daily.  . carvedilol (COREG) 3.125 MG tablet Take 1 tablet (3.125 mg total) by mouth 2 (two) times daily with a meal.  . dorzolamide-timolol (COSOPT) 22.3-6.8  MG/ML ophthalmic solution Place 1 drop into both eyes 2 (two) times daily.   . feeding supplement, GLUCERNA SHAKE, (GLUCERNA SHAKE) LIQD Take 237 mLs by mouth 2 (two) times daily between meals.  . furosemide (LASIX) 20 MG tablet Take 1 tablet (20 mg total) by mouth 2 (two) times daily.  Marland Kitchen  glipiZIDE (GLUCOTROL) 5 MG tablet Take 2.5 mg by mouth daily before breakfast.   . isosorbide mononitrate (IMDUR) 30 MG 24 hr tablet Take 1 tablet (30 mg total) by mouth daily.  Marland Kitchen latanoprost (XALATAN) 0.005 % ophthalmic solution Place 1 drop into both eyes at bedtime.   Marland Kitchen levothyroxine (SYNTHROID, LEVOTHROID) 50 MCG tablet Take 50 mcg by mouth daily.  . pantoprazole (PROTONIX) 40 MG tablet Take 1 tablet (40 mg total) by mouth daily.  Marland Kitchen senna-docusate (SENOKOT-S) 8.6-50 MG tablet Take 1 tablet by mouth at bedtime.  . simvastatin (ZOCOR) 20 MG tablet Take 20 mg by mouth every evening.     Allergies:   Patient has no known allergies.   Social History   Tobacco Use  . Smoking status: Former Smoker    Types: Cigarettes    Last attempt to quit: 11/08/1993    Years since quitting: 24.7  . Smokeless tobacco: Never Used  Substance Use Topics  . Alcohol use: No  . Drug use: No     Family Hx: The patient's family history includes Hypertension in her father.  ROS:   Please see the history of present illness.    ROS All other systems reviewed and are negative.   EKGs/Labs/Other Test Reviewed:    EKG:  EKG is not ordered today.    Recent Labs: 01/31/2018: B Natriuretic Peptide 788.0 02/02/2018: TSH 1.416 04/21/2018: ALT 12 05/27/2018: Platelets 171 08/04/2018: BUN 81; Creatinine, Ser 4.77; Hemoglobin 9.9; Potassium 3.8; Sodium 144   Recent Lipid Panel Lab Results  Component Value Date/Time   CHOL 115 07/29/2017 08:28 AM   TRIG 55 07/29/2017 08:28 AM   HDL 61 07/29/2017 08:28 AM   CHOLHDL 1.9 07/29/2017 08:28 AM   CHOLHDL 1.5 07/07/2016 10:19 AM   LDLCALC 43 07/29/2017 08:28 AM    Physical Exam:    VS:  BP (!) 148/60   Pulse 66   Ht _0  (1.6 m)   Wt 140 lb (63.5 kg)   SpO2 96%   BMI 24.80 kg/m     Wt Readings from Last 3 Encounters:  08/09/18 140 lb (63.5 kg)  06/29/18 138 lb 8 oz (62.8 kg)  06/09/18 140 lb (63.5 kg)     Physical Exam  Constitutional: She is  oriented to person, place, and time. She appears well-developed and well-nourished. No distress.  HENT:  Head: Normocephalic and atraumatic.  Eyes: No scleral icterus.  Neck: No JVD present. No thyromegaly present.  Cardiovascular: Normal rate and regular rhythm.  Murmur heard.  Systolic murmur is present with a grade of 2/6 at the lower left sternal border. Pulmonary/Chest: Effort normal. She has no wheezes. She has no rales.  Abdominal: Soft. She exhibits no distension.  Musculoskeletal: She exhibits no edema.  Lymphadenopathy:    She has no cervical adenopathy.  Neurological: She is alert and oriented to person, place, and time.  Skin: Skin is warm and dry.  Psychiatric: She has a normal mood and affect.    ASSESSMENT & PLAN:    Chronic combined systolic and diastolic CHF (congestive heart failure) (HCC) EF 40-45.  NYHA 2.  Volume is stable.  Continue beta-blocker, nitrates.  She is not on ACE or ARB due to CKD.  Hypertensive heart and chronic kidney disease with heart failure and stage 1 through stage 4 chronic kidney disease, or chronic kidney disease (HCC) BP is acceptable.  If her blood pressure runs higher, we could consider adding Hydralazine.    Coronary artery disease involving native coronary artery of native heart without angina pectoris Presumed due to hx of myocardial infarction in 2013.  She has been managed medically.  She denies angina.  Continue ASA, statin, beta-blocker.    Mitral valve insufficiency, unspecified etiology Moderate by most recent echo.  CKD (chronic kidney disease) stage 4, GFR 15-29 ml/min (HCC) Recent creatinine stable. Continue follow up with Nephrology.     Dispo:  Return in about 6 months (around 02/07/2019) for Routine Follow Up, w/ Dr. Acie Fredrickson.   Medication Adjustments/Labs and Tests Ordered: Current medicines are reviewed at length with the patient today.  Concerns regarding medicines are outlined above.  Tests Ordered: No orders of  the defined types were placed in this encounter.  Medication Changes: No orders of the defined types were placed in this encounter.   Signed, Richardson Dopp, PA-C  08/09/2018 9:48 AM    [facility] [place], [place], [place]  [address] Phone: [phone]; Fax: [phone]

## 2018-08-11 DIAGNOSIS — Z0000 Encounter for general adult medical examination without abnormal findings: Secondary | ICD-10-CM | POA: Diagnosis not present

## 2018-08-11 DIAGNOSIS — H9113 Presbycusis, bilateral: Secondary | ICD-10-CM | POA: Diagnosis not present

## 2018-08-11 DIAGNOSIS — Z1389 Encounter for screening for other disorder: Secondary | ICD-10-CM | POA: Diagnosis not present

## 2018-08-11 DIAGNOSIS — E1165 Type 2 diabetes mellitus with hyperglycemia: Secondary | ICD-10-CM | POA: Diagnosis not present

## 2018-08-11 DIAGNOSIS — R1032 Left lower quadrant pain: Secondary | ICD-10-CM | POA: Diagnosis not present

## 2018-08-11 DIAGNOSIS — I70219 Atherosclerosis of native arteries of extremities with intermittent claudication, unspecified extremity: Secondary | ICD-10-CM | POA: Diagnosis not present

## 2018-08-11 DIAGNOSIS — R42 Dizziness and giddiness: Secondary | ICD-10-CM | POA: Diagnosis not present

## 2018-08-11 DIAGNOSIS — R002 Palpitations: Secondary | ICD-10-CM | POA: Diagnosis not present

## 2018-08-11 DIAGNOSIS — E785 Hyperlipidemia, unspecified: Secondary | ICD-10-CM | POA: Diagnosis not present

## 2018-08-11 DIAGNOSIS — Z1331 Encounter for screening for depression: Secondary | ICD-10-CM | POA: Diagnosis not present

## 2018-08-12 DIAGNOSIS — E039 Hypothyroidism, unspecified: Secondary | ICD-10-CM | POA: Diagnosis not present

## 2018-08-12 DIAGNOSIS — Z131 Encounter for screening for diabetes mellitus: Secondary | ICD-10-CM | POA: Diagnosis not present

## 2018-08-12 DIAGNOSIS — R5383 Other fatigue: Secondary | ICD-10-CM | POA: Diagnosis not present

## 2018-08-12 DIAGNOSIS — Z79899 Other long term (current) drug therapy: Secondary | ICD-10-CM | POA: Diagnosis not present

## 2018-08-12 DIAGNOSIS — E7800 Pure hypercholesterolemia, unspecified: Secondary | ICD-10-CM | POA: Diagnosis not present

## 2018-08-30 DIAGNOSIS — H401122 Primary open-angle glaucoma, left eye, moderate stage: Secondary | ICD-10-CM | POA: Diagnosis not present

## 2018-08-30 DIAGNOSIS — H353131 Nonexudative age-related macular degeneration, bilateral, early dry stage: Secondary | ICD-10-CM | POA: Diagnosis not present

## 2018-08-31 ENCOUNTER — Other Ambulatory Visit (HOSPITAL_COMMUNITY): Payer: Self-pay

## 2018-09-01 ENCOUNTER — Ambulatory Visit (HOSPITAL_COMMUNITY)
Admission: RE | Admit: 2018-09-01 | Discharge: 2018-09-01 | Disposition: A | Discharge status: Home / Self Care | Payer: Medicare HMO | Source: Ambulatory Visit | Attending: Nephrology | Admitting: Nephrology

## 2018-09-01 VITALS — BP 144/64 | HR 55 | Temp 97.7°F | Resp 20

## 2018-09-01 DIAGNOSIS — D631 Anemia in chronic kidney disease: Secondary | ICD-10-CM | POA: Diagnosis not present

## 2018-09-01 DIAGNOSIS — N184 Chronic kidney disease, stage 4 (severe): Secondary | ICD-10-CM | POA: Diagnosis not present

## 2018-09-01 LAB — RENAL FUNCTION PANEL
Albumin: 3.6 g/dL (ref 3.5–5.0)
Anion gap: 7 (ref 5–15)
BUN: 63 mg/dL — AB (ref 8–23)
CO2: 25 mmol/L (ref 22–32)
CREATININE: 4.17 mg/dL — AB (ref 0.44–1.00)
Calcium: 8.9 mg/dL (ref 8.9–10.3)
Chloride: 108 mmol/L (ref 98–111)
GFR calc Af Amer: 10 mL/min — ABNORMAL LOW (ref >60)
GFR, EST NON AFRICAN AMERICAN: 9 mL/min — AB (ref >60)
GLUCOSE: 133 mg/dL — AB (ref 70–99)
PHOSPHORUS: 4.5 mg/dL (ref 2.5–4.6)
Potassium: 3.6 mmol/L (ref 3.5–5.1)
Sodium: 140 mmol/L (ref 135–145)

## 2018-09-01 LAB — IRON AND TIBC
IRON: 79 ug/dL (ref 28–170)
Saturation Ratios: 31 % (ref 10.4–31.8)
TIBC: 255 ug/dL (ref 250–450)
UIBC: 176 ug/dL

## 2018-09-01 LAB — POCT HEMOGLOBIN-HEMACUE: Hemoglobin: 10 g/dL — ABNORMAL LOW (ref 12.0–15.0)

## 2018-09-01 LAB — FERRITIN: FERRITIN: 410 ng/mL — AB (ref 11–307)

## 2018-09-01 MED ORDER — EPOETIN ALFA 20000 UNIT/ML IJ SOLN
20000.0000 [IU] | INTRAMUSCULAR | Status: DC
  Administered 2018-09-01: 20000 [IU] via SUBCUTANEOUS

## 2018-09-01 MED ORDER — EPOETIN ALFA 20000 UNIT/ML IJ SOLN
INTRAMUSCULAR | Status: AC
  Administered 2018-09-01: 20000 [IU] via SUBCUTANEOUS
  Filled 2018-09-01: qty 1

## 2018-09-02 LAB — PTH, INTACT AND CALCIUM
CALCIUM TOTAL (PTH): 8.8 mg/dL (ref 8.7–10.3)
PTH: 76 pg/mL — ABNORMAL HIGH (ref 15–65)

## 2018-09-29 ENCOUNTER — Ambulatory Visit (HOSPITAL_COMMUNITY)
Admission: RE | Admit: 2018-09-29 | Discharge: 2018-09-29 | Disposition: A | Discharge status: Home / Self Care | Payer: Medicare HMO | Source: Ambulatory Visit | Attending: Nephrology | Admitting: Nephrology

## 2018-09-29 VITALS — BP 142/60 | HR 60 | Temp 97.7°F | Resp 20

## 2018-09-29 DIAGNOSIS — N184 Chronic kidney disease, stage 4 (severe): Secondary | ICD-10-CM | POA: Diagnosis present

## 2018-09-29 LAB — RENAL FUNCTION PANEL
Albumin: 3.7 g/dL (ref 3.5–5.0)
Anion gap: 8 (ref 5–15)
BUN: 62 mg/dL — ABNORMAL HIGH (ref 8–23)
CALCIUM: 9.1 mg/dL (ref 8.9–10.3)
CO2: 26 mmol/L (ref 22–32)
Chloride: 107 mmol/L (ref 98–111)
Creatinine, Ser: 4.66 mg/dL — ABNORMAL HIGH (ref 0.44–1.00)
GFR calc non Af Amer: 8 mL/min — ABNORMAL LOW (ref >60)
GFR, EST AFRICAN AMERICAN: 9 mL/min — AB (ref >60)
Glucose, Bld: 166 mg/dL — ABNORMAL HIGH (ref 70–99)
POTASSIUM: 3.7 mmol/L (ref 3.5–5.1)
Phosphorus: 4.2 mg/dL (ref 2.5–4.6)
SODIUM: 141 mmol/L (ref 135–145)

## 2018-09-29 LAB — IRON AND TIBC
Iron: 71 ug/dL (ref 28–170)
SATURATION RATIOS: 28 % (ref 10.4–31.8)
TIBC: 253 ug/dL (ref 250–450)
UIBC: 182 ug/dL

## 2018-09-29 LAB — POCT HEMOGLOBIN-HEMACUE: HEMOGLOBIN: 9.8 g/dL — AB (ref 12.0–15.0)

## 2018-09-29 LAB — FERRITIN: Ferritin: 410 ng/mL — ABNORMAL HIGH (ref 11–307)

## 2018-09-29 MED ORDER — EPOETIN ALFA 20000 UNIT/ML IJ SOLN
INTRAMUSCULAR | Status: AC
  Administered 2018-09-29: 20000 [IU] via SUBCUTANEOUS
  Filled 2018-09-29: qty 1

## 2018-09-29 MED ORDER — EPOETIN ALFA 20000 UNIT/ML IJ SOLN
20000.0000 [IU] | INTRAMUSCULAR | Status: DC
  Administered 2018-09-29: 20000 [IU] via SUBCUTANEOUS

## 2018-09-30 LAB — PTH, INTACT AND CALCIUM
CALCIUM TOTAL (PTH): 9 mg/dL (ref 8.7–10.3)
PTH: 48 pg/mL (ref 15–65)

## 2018-10-18 DIAGNOSIS — H04123 Dry eye syndrome of bilateral lacrimal glands: Secondary | ICD-10-CM | POA: Diagnosis not present

## 2018-10-25 DIAGNOSIS — H52223 Regular astigmatism, bilateral: Secondary | ICD-10-CM | POA: Diagnosis not present

## 2018-10-25 DIAGNOSIS — H524 Presbyopia: Secondary | ICD-10-CM | POA: Diagnosis not present

## 2018-10-27 ENCOUNTER — Encounter (HOSPITAL_COMMUNITY): Payer: Medicare HMO

## 2018-10-27 ENCOUNTER — Ambulatory Visit (HOSPITAL_COMMUNITY)
Admission: RE | Admit: 2018-10-27 | Discharge: 2018-10-27 | Disposition: A | Discharge status: Home / Self Care | Payer: Medicare HMO | Source: Ambulatory Visit | Attending: Nephrology | Admitting: Nephrology

## 2018-10-27 VITALS — BP 148/57 | HR 61 | Temp 98.0°F | Resp 20

## 2018-10-27 DIAGNOSIS — N184 Chronic kidney disease, stage 4 (severe): Secondary | ICD-10-CM | POA: Insufficient documentation

## 2018-10-27 LAB — IRON AND TIBC
Iron: 85 ug/dL (ref 28–170)
Saturation Ratios: 32 % — ABNORMAL HIGH (ref 10.4–31.8)
TIBC: 267 ug/dL (ref 250–450)
UIBC: 182 ug/dL

## 2018-10-27 LAB — RENAL FUNCTION PANEL
Albumin: 3.6 g/dL (ref 3.5–5.0)
Anion gap: 11 (ref 5–15)
BUN: 62 mg/dL — ABNORMAL HIGH (ref 8–23)
CO2: 24 mmol/L (ref 22–32)
CREATININE: 4.87 mg/dL — AB (ref 0.44–1.00)
Calcium: 8.7 mg/dL — ABNORMAL LOW (ref 8.9–10.3)
Chloride: 107 mmol/L (ref 98–111)
GFR calc Af Amer: 9 mL/min — ABNORMAL LOW (ref >60)
GFR calc non Af Amer: 8 mL/min — ABNORMAL LOW (ref >60)
Glucose, Bld: 141 mg/dL — ABNORMAL HIGH (ref 70–99)
Phosphorus: 5.5 mg/dL — ABNORMAL HIGH (ref 2.5–4.6)
Potassium: 3.7 mmol/L (ref 3.5–5.1)
Sodium: 142 mmol/L (ref 135–145)

## 2018-10-27 LAB — POCT HEMOGLOBIN-HEMACUE: Hemoglobin: 9.3 g/dL — ABNORMAL LOW (ref 12.0–15.0)

## 2018-10-27 LAB — FERRITIN: Ferritin: 361 ng/mL — ABNORMAL HIGH (ref 11–307)

## 2018-10-27 MED ORDER — EPOETIN ALFA 20000 UNIT/ML IJ SOLN
INTRAMUSCULAR | Status: AC
  Administered 2018-10-27: 20000 [IU] via SUBCUTANEOUS
  Filled 2018-10-27: qty 1

## 2018-10-27 MED ORDER — EPOETIN ALFA 20000 UNIT/ML IJ SOLN
20000.0000 [IU] | INTRAMUSCULAR | Status: DC
  Administered 2018-10-27: 20000 [IU] via SUBCUTANEOUS

## 2018-10-28 LAB — PTH, INTACT AND CALCIUM
Calcium, Total (PTH): 8.7 mg/dL (ref 8.7–10.3)
PTH: 83 pg/mL — ABNORMAL HIGH (ref 15–65)

## 2018-11-20 IMAGING — DX DG CHEST 2V
2 series · 2 of 2 positions shown · non-contrast
Comparison: July 24, 2017

CLINICAL DATA: Shortness of breath.

EXAM:
CHEST - 2 VIEW

[chest pa]
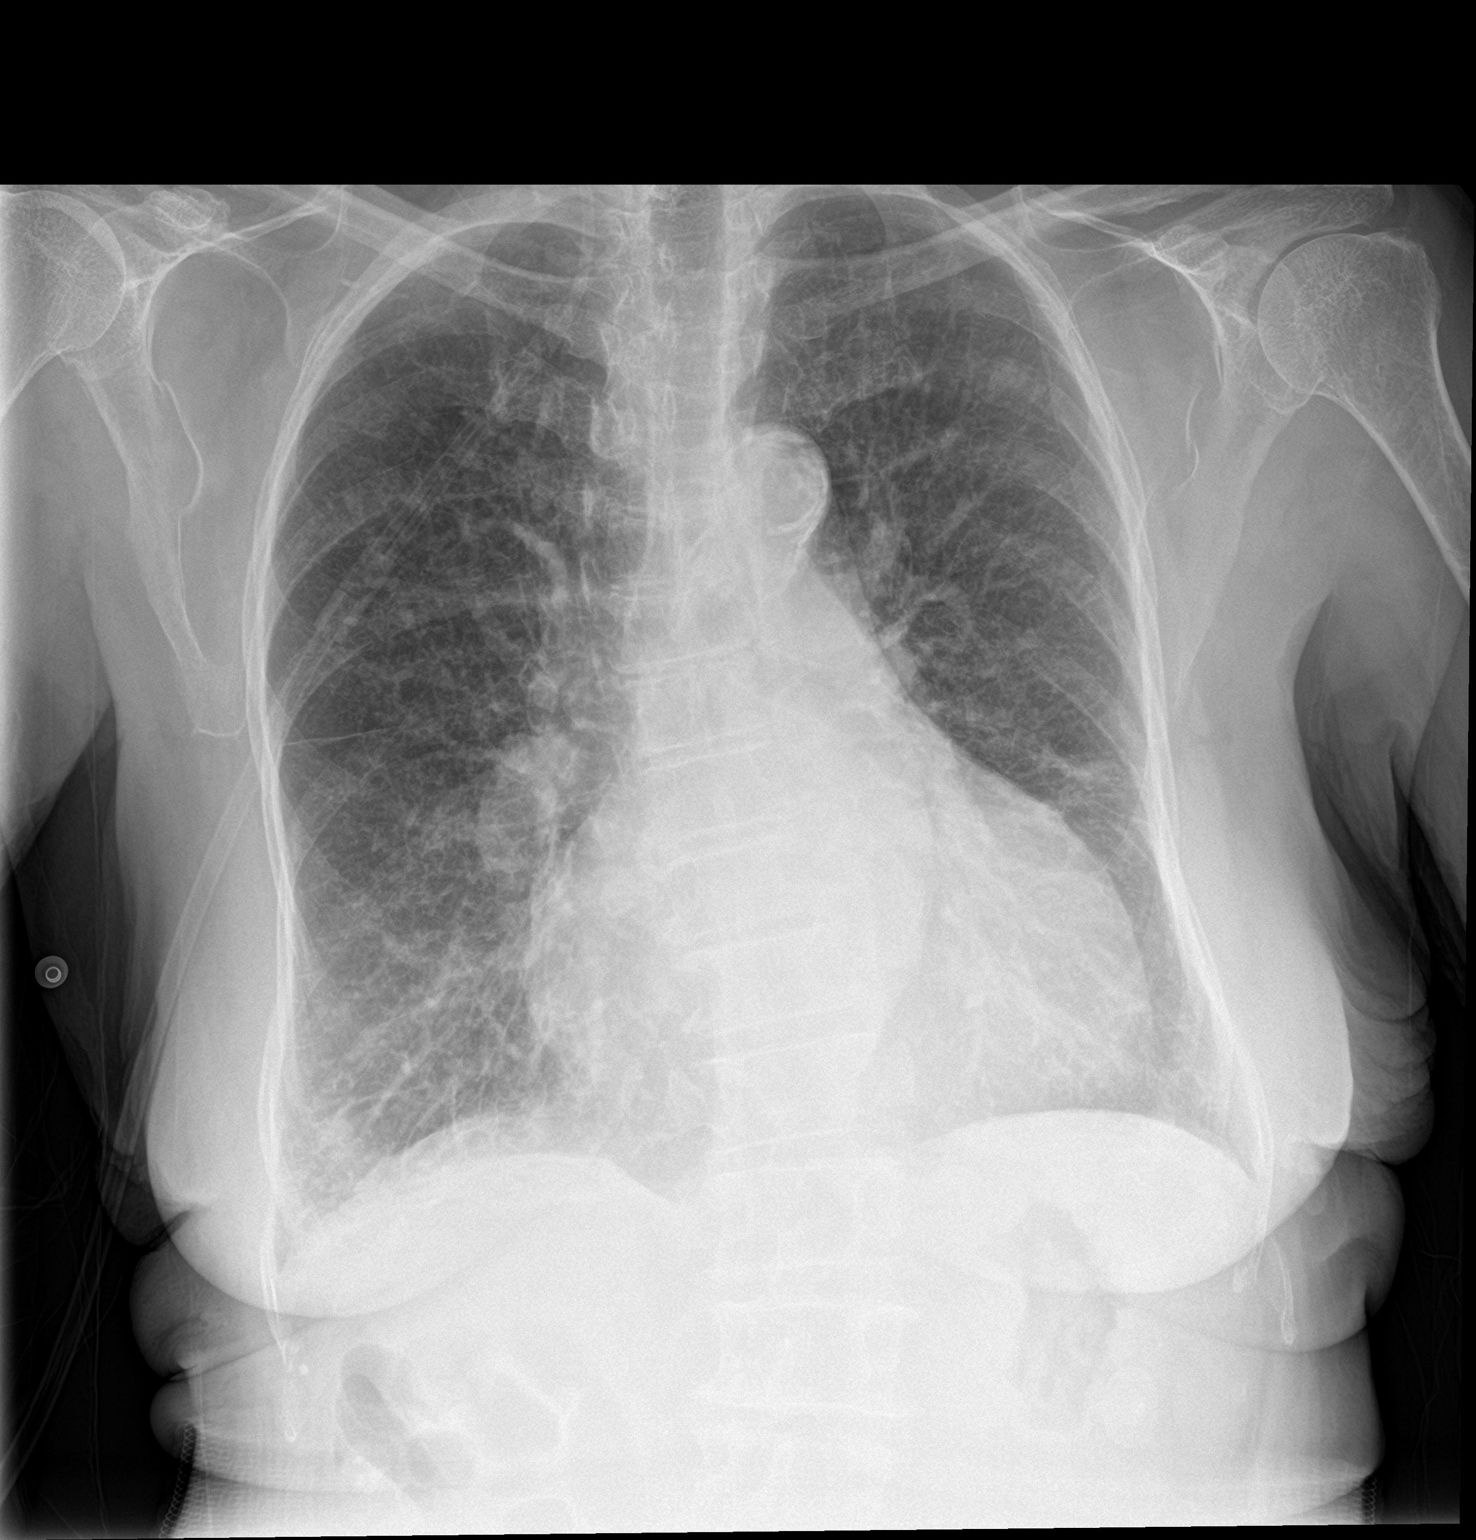

[chest lat]
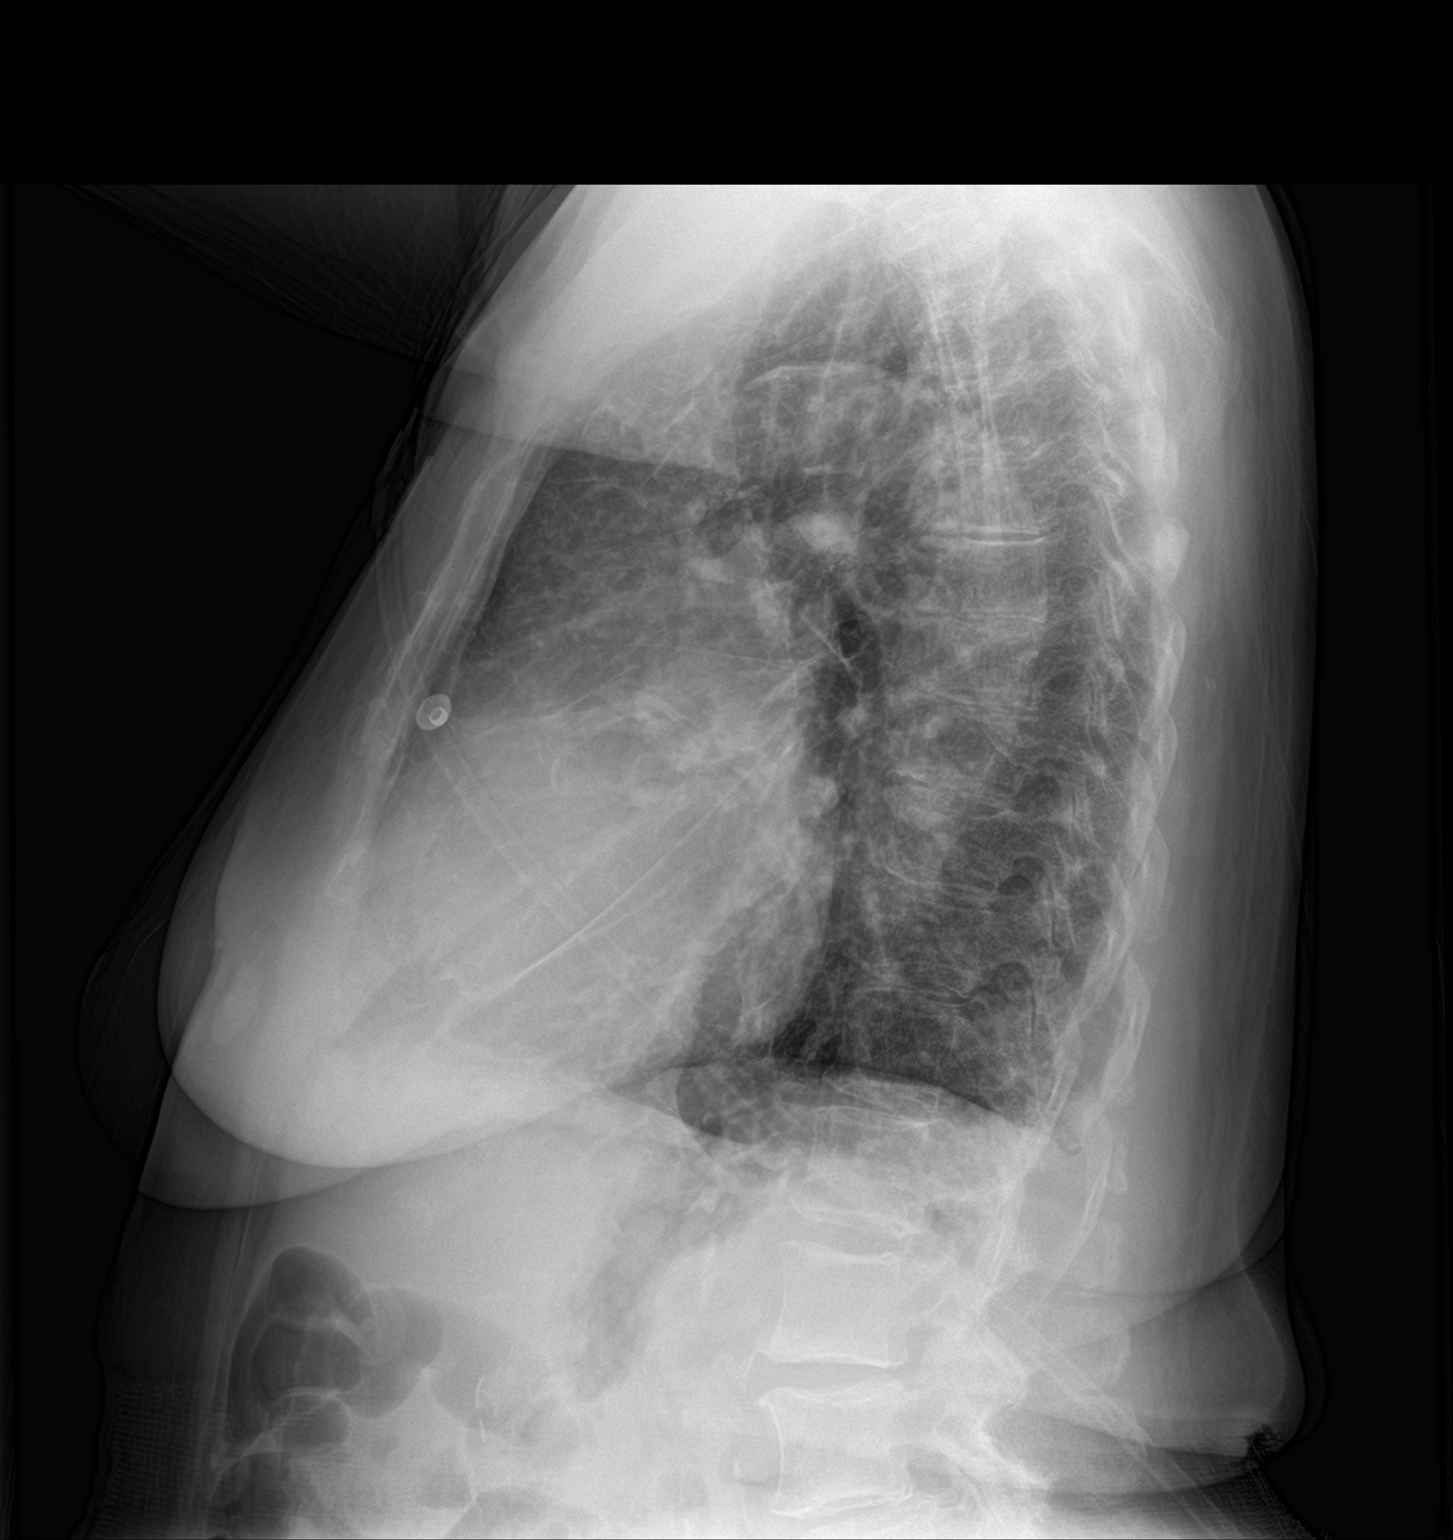

[2 of 2 positions shown; findings below may reference images not displayed]

FINDINGS: No pneumothorax. Cardiomegaly. The hila and mediastinum are
unchanged. Mild increased interstitial markings in the lungs suggest
pulmonary venous congestion/mild edema versus bronchitic change. A
nodular density over the lateral right base may represent a nipple
shadow. No other acute abnormalities.
IMPRESSION: 1. Cardiomegaly and mild edema/pulmonary venous congestion.
2. A nodular density over the lateral right lung base may represent
a nipple shadow. Recommend repeat imaging upon resolution of
symptoms with nipple markers to ensure resolution.

## 2018-11-24 ENCOUNTER — Ambulatory Visit (HOSPITAL_COMMUNITY)
Admission: RE | Admit: 2018-11-24 | Discharge: 2018-11-24 | Disposition: A | Discharge status: Home / Self Care | Payer: Medicare HMO | Source: Ambulatory Visit | Attending: Nephrology | Admitting: Nephrology

## 2018-11-24 VITALS — BP 136/52 | HR 60 | Temp 98.0°F | Resp 20

## 2018-11-24 DIAGNOSIS — N184 Chronic kidney disease, stage 4 (severe): Secondary | ICD-10-CM | POA: Diagnosis present

## 2018-11-24 LAB — RENAL FUNCTION PANEL
ALBUMIN: 3.7 g/dL (ref 3.5–5.0)
Anion gap: 12 (ref 5–15)
BUN: 76 mg/dL — ABNORMAL HIGH (ref 8–23)
CO2: 25 mmol/L (ref 22–32)
Calcium: 9.2 mg/dL (ref 8.9–10.3)
Chloride: 105 mmol/L (ref 98–111)
Creatinine, Ser: 5.48 mg/dL — ABNORMAL HIGH (ref 0.44–1.00)
GFR calc non Af Amer: 7 mL/min — ABNORMAL LOW (ref >60)
GFR, EST AFRICAN AMERICAN: 8 mL/min — AB (ref >60)
Glucose, Bld: 158 mg/dL — ABNORMAL HIGH (ref 70–99)
PHOSPHORUS: 4.6 mg/dL (ref 2.5–4.6)
Potassium: 3.8 mmol/L (ref 3.5–5.1)
Sodium: 142 mmol/L (ref 135–145)

## 2018-11-24 LAB — FERRITIN: Ferritin: 339 ng/mL — ABNORMAL HIGH (ref 11–307)

## 2018-11-24 LAB — POCT HEMOGLOBIN-HEMACUE: Hemoglobin: 9 g/dL — ABNORMAL LOW (ref 12.0–15.0)

## 2018-11-24 LAB — IRON AND TIBC
IRON: 62 ug/dL (ref 28–170)
Saturation Ratios: 21 % (ref 10.4–31.8)
TIBC: 290 ug/dL (ref 250–450)
UIBC: 228 ug/dL

## 2018-11-24 MED ORDER — EPOETIN ALFA 20000 UNIT/ML IJ SOLN
INTRAMUSCULAR | Status: AC
  Filled 2018-11-24: qty 1

## 2018-11-24 MED ORDER — EPOETIN ALFA 20000 UNIT/ML IJ SOLN
20000.0000 [IU] | INTRAMUSCULAR | Status: DC
  Administered 2018-11-24: 20000 [IU] via SUBCUTANEOUS

## 2018-11-25 LAB — PTH, INTACT AND CALCIUM
Calcium, Total (PTH): 9.4 mg/dL (ref 8.7–10.3)
PTH: 67 pg/mL — AB (ref 15–65)

## 2018-11-26 ENCOUNTER — Ambulatory Visit: Payer: Medicare HMO | Admitting: Podiatry

## 2018-11-26 ENCOUNTER — Encounter: Payer: Self-pay | Admitting: Podiatry

## 2018-11-26 DIAGNOSIS — M79676 Pain in unspecified toe(s): Secondary | ICD-10-CM | POA: Diagnosis not present

## 2018-11-26 DIAGNOSIS — B351 Tinea unguium: Secondary | ICD-10-CM | POA: Diagnosis not present

## 2018-11-26 DIAGNOSIS — M79609 Pain in unspecified limb: Secondary | ICD-10-CM

## 2018-11-26 DIAGNOSIS — E1151 Type 2 diabetes mellitus with diabetic peripheral angiopathy without gangrene: Secondary | ICD-10-CM | POA: Diagnosis not present

## 2018-11-28 NOTE — Progress Notes (Signed)
Subjective:  Patient ID: Mandy Larson, female    DOB: [birth year],  MRN: [id number]  Chief Complaint  Patient presents with  . Debridement    Trim toenails    85 y.o. female presents  for diabetic foot care. Last AMBS was unknown. Denies numbness and tingling in their feet. Denies cramping in legs and thighs.  Review of Systems: Negative except as noted in the HPI. Denies N/V/F/Ch.  Past Medical History:  Diagnosis Date  . Anemia   . CAD (coronary artery disease) 06/26/2014   admx with NSTEMI in 12/13 >> tx medically due to CKD  . Chronic combined systolic and diastolic CHF (congestive heart failure) ([place]) 11/08/2012   Echo 12/13:  EF 35-40, inferior and inferoseptal akinesis, grade 2 diastolic dysfunction, MAC, dysfunction/tethering of the posterior papillary muscle as a result of inferior infarct, severe MR, moderate PI, PASP 50, small circumferential pericardial effusion without hemodynamic compromise  . CKD (chronic kidney disease)   . Diabetes mellitus without complication ([place])    type 2  . Former smoker   . Heart attack ([place])   . Hyperlipidemia   . Hypertension   . Malignant neoplasm of lower-inner quadrant of right breast of female, estrogen receptor positive ([place]) 04/21/2018  . Pulmonary hypertension ([place]) 11/08/2012  . Severe mitral regurgitation 11/08/2012   Due to papillary muscle dysfunction from prior inf infarct    Current Outpatient Medications:  .  acetaminophen (TYLENOL) 325 MG tablet, Take 325 mg by mouth every 6 (six) hours as needed for mild pain or headache. , Disp: , Rfl:  .  amLODipine (NORVASC) 10 MG tablet, Take 10 mg by mouth daily., Disp: , Rfl: 4 .  anastrozole (ARIMIDEX) 1 MG tablet, Take 1 tablet (1 mg total) by mouth daily., Disp: 90 tablet, Rfl: 4 .  aspirin 81 MG chewable tablet, Chew 81 mg by mouth daily., Disp: , Rfl:  .  Calcium Carb-Cholecalciferol (CALTRATE 600+D) 600-800 MG-UNIT TABS, Take 1 tablet by mouth daily., Disp: , Rfl:  .   carvedilol (COREG) 3.125 MG tablet, Take 1 tablet (3.125 mg total) by mouth 2 (two) times daily with a meal., Disp: 180 tablet, Rfl: 3 .  dorzolamide-timolol (COSOPT) 22.3-6.8 MG/ML ophthalmic solution, Place 1 drop into both eyes 2 (two) times daily. , Disp: , Rfl:  .  feeding supplement, GLUCERNA SHAKE, (GLUCERNA SHAKE) LIQD, Take 237 mLs by mouth 2 (two) times daily between meals., Disp: , Rfl: 0 .  furosemide (LASIX) 20 MG tablet, Take 1 tablet (20 mg total) by mouth 2 (two) times daily., Disp: 60 tablet, Rfl: 0 .  glipiZIDE (GLUCOTROL) 5 MG tablet, Take 2.5 mg by mouth daily before breakfast. , Disp: , Rfl:  .  isosorbide mononitrate (IMDUR) 30 MG 24 hr tablet, Take 1 tablet (30 mg total) by mouth daily., Disp: 30 tablet, Rfl: 5 .  latanoprost (XALATAN) 0.005 % ophthalmic solution, Place 1 drop into both eyes at bedtime. , Disp: , Rfl:  .  levothyroxine (SYNTHROID, LEVOTHROID) 50 MCG tablet, Take 50 mcg by mouth daily., Disp: , Rfl:  .  pantoprazole (PROTONIX) 40 MG tablet, Take 1 tablet (40 mg total) by mouth daily., Disp: 60 tablet, Rfl: 0 .  senna-docusate (SENOKOT-S) 8.6-50 MG tablet, Take 1 tablet by mouth at bedtime., Disp: 30 tablet, Rfl: 0 .  simvastatin (ZOCOR) 20 MG tablet, Take 20 mg by mouth every evening., Disp: , Rfl:   Social History   Tobacco Use  Smoking Status Former Smoker  . Types: Cigarettes  .  Last attempt to quit: 11/08/1993  . Years since quitting: 25.0  Smokeless Tobacco Never Used    No Known Allergies Objective:  There were no vitals filed for this visit. There is no height or weight on file to calculate BMI. Constitutional Well developed. Well nourished.  Vascular Dorsalis pedis pulses present 1+ bilaterally  Posterior tibial pulses absent bilaterally  Pedal hair growth absent. Capillary refill normal to all digits.  No cyanosis or clubbing noted.  Neurologic Normal speech. Oriented to person, place, and time. Epicritic sensation to light touch  grossly present bilaterally. Protective sensation with 5.07 monofilament  present bilaterally. Vibratory sensation present bilaterally.  Dermatologic Nails elongated, thickened, dystrophic. No open wounds. No skin lesions.  Orthopedic: Normal joint ROM without pain or crepitus bilaterally. No visible deformities. No bony tenderness.   Assessment:   1. Diabetes mellitus type 2 with peripheral artery disease (HCC)   2. Pain due to onychomycosis of nail    Plan:  Patient was evaluated and treated and all questions answered.  Diabetes with PD, Onychomycosis -Educated on diabetic footcare. Diabetic risk level 1 -Nails x10 debrided sharply and manually with large nail nipper and rotary burr.   Procedure: Nail Debridement Rationale: Patient meets criteria for routine foot care due to PAD Type of Debridement: manual, sharp debridement. Instrumentation: Nail nipper, rotary burr. Number of Nails: 10     Return in about 4 months (around 03/27/2019) for Routine Foot Care.

## 2018-12-16 ENCOUNTER — Inpatient Hospital Stay (HOSPITAL_COMMUNITY)
Admission: EM | Admit: 2018-12-16 | Discharge: 2018-12-21 | DRG: 291 | Disposition: A | Discharge status: Home Health | Payer: Medicare HMO | Attending: Internal Medicine | Admitting: Internal Medicine

## 2018-12-16 ENCOUNTER — Encounter (HOSPITAL_COMMUNITY): Payer: Self-pay | Admitting: *Deleted

## 2018-12-16 ENCOUNTER — Emergency Department (HOSPITAL_COMMUNITY): Payer: Medicare HMO

## 2018-12-16 ENCOUNTER — Inpatient Hospital Stay (HOSPITAL_COMMUNITY): Payer: Medicare HMO

## 2018-12-16 ENCOUNTER — Other Ambulatory Visit: Payer: Self-pay

## 2018-12-16 DIAGNOSIS — I2510 Atherosclerotic heart disease of native coronary artery without angina pectoris: Secondary | ICD-10-CM | POA: Diagnosis present

## 2018-12-16 DIAGNOSIS — I4581 Long QT syndrome: Secondary | ICD-10-CM | POA: Diagnosis present

## 2018-12-16 DIAGNOSIS — I340 Nonrheumatic mitral (valve) insufficiency: Secondary | ICD-10-CM | POA: Diagnosis present

## 2018-12-16 DIAGNOSIS — Y92230 Patient room in hospital as the place of occurrence of the external cause: Secondary | ICD-10-CM | POA: Diagnosis not present

## 2018-12-16 DIAGNOSIS — Z7982 Long term (current) use of aspirin: Secondary | ICD-10-CM

## 2018-12-16 DIAGNOSIS — I272 Pulmonary hypertension, unspecified: Secondary | ICD-10-CM | POA: Diagnosis present

## 2018-12-16 DIAGNOSIS — I462 Cardiac arrest due to underlying cardiac condition: Secondary | ICD-10-CM | POA: Diagnosis not present

## 2018-12-16 DIAGNOSIS — E039 Hypothyroidism, unspecified: Secondary | ICD-10-CM | POA: Diagnosis present

## 2018-12-16 DIAGNOSIS — I5043 Acute on chronic combined systolic (congestive) and diastolic (congestive) heart failure: Secondary | ICD-10-CM | POA: Diagnosis present

## 2018-12-16 DIAGNOSIS — D649 Anemia, unspecified: Secondary | ICD-10-CM | POA: Diagnosis present

## 2018-12-16 DIAGNOSIS — R001 Bradycardia, unspecified: Secondary | ICD-10-CM

## 2018-12-16 DIAGNOSIS — C50311 Malignant neoplasm of lower-inner quadrant of right female breast: Secondary | ICD-10-CM | POA: Diagnosis present

## 2018-12-16 DIAGNOSIS — N184 Chronic kidney disease, stage 4 (severe): Secondary | ICD-10-CM | POA: Diagnosis not present

## 2018-12-16 DIAGNOSIS — T447X5A Adverse effect of beta-adrenoreceptor antagonists, initial encounter: Secondary | ICD-10-CM | POA: Diagnosis not present

## 2018-12-16 DIAGNOSIS — I5042 Chronic combined systolic (congestive) and diastolic (congestive) heart failure: Secondary | ICD-10-CM | POA: Diagnosis present

## 2018-12-16 DIAGNOSIS — Z8249 Family history of ischemic heart disease and other diseases of the circulatory system: Secondary | ICD-10-CM

## 2018-12-16 DIAGNOSIS — Z79811 Long term (current) use of aromatase inhibitors: Secondary | ICD-10-CM

## 2018-12-16 DIAGNOSIS — I252 Old myocardial infarction: Secondary | ICD-10-CM | POA: Diagnosis not present

## 2018-12-16 DIAGNOSIS — E119 Type 2 diabetes mellitus without complications: Secondary | ICD-10-CM

## 2018-12-16 DIAGNOSIS — Z170 Estrogen receptor positive status [ER+]: Secondary | ICD-10-CM

## 2018-12-16 DIAGNOSIS — J9600 Acute respiratory failure, unspecified whether with hypoxia or hypercapnia: Secondary | ICD-10-CM

## 2018-12-16 DIAGNOSIS — I132 Hypertensive heart and chronic kidney disease with heart failure and with stage 5 chronic kidney disease, or end stage renal disease: Secondary | ICD-10-CM | POA: Diagnosis present

## 2018-12-16 DIAGNOSIS — R0603 Acute respiratory distress: Secondary | ICD-10-CM

## 2018-12-16 DIAGNOSIS — I4510 Unspecified right bundle-branch block: Secondary | ICD-10-CM | POA: Diagnosis present

## 2018-12-16 DIAGNOSIS — Z79890 Hormone replacement therapy (postmenopausal): Secondary | ICD-10-CM

## 2018-12-16 DIAGNOSIS — E1122 Type 2 diabetes mellitus with diabetic chronic kidney disease: Secondary | ICD-10-CM | POA: Diagnosis present

## 2018-12-16 DIAGNOSIS — Z66 Do not resuscitate: Secondary | ICD-10-CM | POA: Diagnosis present

## 2018-12-16 DIAGNOSIS — N185 Chronic kidney disease, stage 5: Secondary | ICD-10-CM | POA: Diagnosis present

## 2018-12-16 DIAGNOSIS — E785 Hyperlipidemia, unspecified: Secondary | ICD-10-CM | POA: Diagnosis present

## 2018-12-16 DIAGNOSIS — J9602 Acute respiratory failure with hypercapnia: Secondary | ICD-10-CM | POA: Diagnosis present

## 2018-12-16 DIAGNOSIS — R0602 Shortness of breath: Secondary | ICD-10-CM | POA: Diagnosis present

## 2018-12-16 DIAGNOSIS — K219 Gastro-esophageal reflux disease without esophagitis: Secondary | ICD-10-CM | POA: Diagnosis present

## 2018-12-16 DIAGNOSIS — T463X5A Adverse effect of coronary vasodilators, initial encounter: Secondary | ICD-10-CM | POA: Diagnosis not present

## 2018-12-16 DIAGNOSIS — N189 Chronic kidney disease, unspecified: Secondary | ICD-10-CM

## 2018-12-16 DIAGNOSIS — Z7984 Long term (current) use of oral hypoglycemic drugs: Secondary | ICD-10-CM

## 2018-12-16 DIAGNOSIS — J9601 Acute respiratory failure with hypoxia: Secondary | ICD-10-CM | POA: Diagnosis present

## 2018-12-16 DIAGNOSIS — T495X5A Adverse effect of ophthalmological drugs and preparations, initial encounter: Secondary | ICD-10-CM | POA: Diagnosis not present

## 2018-12-16 DIAGNOSIS — Z79899 Other long term (current) drug therapy: Secondary | ICD-10-CM

## 2018-12-16 DIAGNOSIS — Z87891 Personal history of nicotine dependence: Secondary | ICD-10-CM

## 2018-12-16 DIAGNOSIS — I130 Hypertensive heart and chronic kidney disease with heart failure and stage 1 through stage 4 chronic kidney disease, or unspecified chronic kidney disease: Secondary | ICD-10-CM | POA: Diagnosis not present

## 2018-12-16 HISTORY — DX: Acute respiratory distress: R06.03

## 2018-12-16 LAB — I-STAT CREATININE, ED: Creatinine, Ser: 5.6 mg/dL — ABNORMAL HIGH (ref 0.44–1.00)

## 2018-12-16 LAB — BASIC METABOLIC PANEL
Anion gap: 11 (ref 5–15)
BUN: 77 mg/dL — ABNORMAL HIGH (ref 8–23)
CHLORIDE: 109 mmol/L (ref 98–111)
CO2: 24 mmol/L (ref 22–32)
Calcium: 8.2 mg/dL — ABNORMAL LOW (ref 8.9–10.3)
Creatinine, Ser: 5.3 mg/dL — ABNORMAL HIGH (ref 0.44–1.00)
GFR calc Af Amer: 8 mL/min — ABNORMAL LOW (ref >60)
GFR calc non Af Amer: 7 mL/min — ABNORMAL LOW (ref >60)
Glucose, Bld: 135 mg/dL — ABNORMAL HIGH (ref 70–99)
Potassium: 4.2 mmol/L (ref 3.5–5.1)
Sodium: 144 mmol/L (ref 135–145)

## 2018-12-16 LAB — CBC
HCT: 28.5 % — ABNORMAL LOW (ref 36.0–46.0)
HEMOGLOBIN: 8.3 g/dL — AB (ref 12.0–15.0)
MCH: 26.7 pg (ref 26.0–34.0)
MCHC: 29.1 g/dL — ABNORMAL LOW (ref 30.0–36.0)
MCV: 91.6 fL (ref 80.0–100.0)
Platelets: 178 10*3/uL (ref 150–400)
RBC: 3.11 MIL/uL — AB (ref 3.87–5.11)
RDW: 15.7 % — ABNORMAL HIGH (ref 11.5–15.5)
WBC: 8.2 10*3/uL (ref 4.0–10.5)
nRBC: 0 % (ref 0.0–0.2)

## 2018-12-16 LAB — COMPREHENSIVE METABOLIC PANEL
ALT: 20 U/L (ref 0–44)
AST: 26 U/L (ref 15–41)
Albumin: 3.8 g/dL (ref 3.5–5.0)
Alkaline Phosphatase: 60 U/L (ref 38–126)
Anion gap: 11 (ref 5–15)
BUN: 71 mg/dL — AB (ref 8–23)
CO2: 24 mmol/L (ref 22–32)
Calcium: 8.8 mg/dL — ABNORMAL LOW (ref 8.9–10.3)
Chloride: 107 mmol/L (ref 98–111)
Creatinine, Ser: 5.39 mg/dL — ABNORMAL HIGH (ref 0.44–1.00)
GFR calc Af Amer: 8 mL/min — ABNORMAL LOW (ref >60)
GFR calc non Af Amer: 7 mL/min — ABNORMAL LOW (ref >60)
Glucose, Bld: 179 mg/dL — ABNORMAL HIGH (ref 70–99)
Potassium: 4.2 mmol/L (ref 3.5–5.1)
Sodium: 142 mmol/L (ref 135–145)
Total Bilirubin: 0.6 mg/dL (ref 0.3–1.2)
Total Protein: 7.4 g/dL (ref 6.5–8.1)

## 2018-12-16 LAB — CBC WITH DIFFERENTIAL/PLATELET
Abs Immature Granulocytes: 0.02 10*3/uL (ref 0.00–0.07)
Basophils Absolute: 0 10*3/uL (ref 0.0–0.1)
Basophils Relative: 0 %
Eosinophils Absolute: 0.3 10*3/uL (ref 0.0–0.5)
Eosinophils Relative: 3 %
HCT: 31.6 % — ABNORMAL LOW (ref 36.0–46.0)
Hemoglobin: 9.2 g/dL — ABNORMAL LOW (ref 12.0–15.0)
IMMATURE GRANULOCYTES: 0 %
Lymphocytes Relative: 16 %
Lymphs Abs: 1.3 10*3/uL (ref 0.7–4.0)
MCH: 26.4 pg (ref 26.0–34.0)
MCHC: 29.1 g/dL — ABNORMAL LOW (ref 30.0–36.0)
MCV: 90.8 fL (ref 80.0–100.0)
MONOS PCT: 11 %
Monocytes Absolute: 0.9 10*3/uL (ref 0.1–1.0)
NEUTROS PCT: 70 %
Neutro Abs: 5.5 10*3/uL (ref 1.7–7.7)
Platelets: 201 10*3/uL (ref 150–400)
RBC: 3.48 MIL/uL — ABNORMAL LOW (ref 3.87–5.11)
RDW: 15.6 % — ABNORMAL HIGH (ref 11.5–15.5)
WBC: 8 10*3/uL (ref 4.0–10.5)
nRBC: 0 % (ref 0.0–0.2)

## 2018-12-16 LAB — RENAL FUNCTION PANEL
Albumin: 3.6 g/dL (ref 3.5–5.0)
Anion gap: 18 — ABNORMAL HIGH (ref 5–15)
BUN: 74 mg/dL — AB (ref 8–23)
CHLORIDE: 108 mmol/L (ref 98–111)
CO2: 20 mmol/L — ABNORMAL LOW (ref 22–32)
Calcium: 9.1 mg/dL (ref 8.9–10.3)
Creatinine, Ser: 5.28 mg/dL — ABNORMAL HIGH (ref 0.44–1.00)
GFR calc Af Amer: 8 mL/min — ABNORMAL LOW (ref >60)
GFR calc non Af Amer: 7 mL/min — ABNORMAL LOW (ref >60)
Glucose, Bld: 149 mg/dL — ABNORMAL HIGH (ref 70–99)
Phosphorus: 5.9 mg/dL — ABNORMAL HIGH (ref 2.5–4.6)
Potassium: 4.3 mmol/L (ref 3.5–5.1)
Sodium: 146 mmol/L — ABNORMAL HIGH (ref 135–145)

## 2018-12-16 LAB — GLUCOSE, CAPILLARY
Glucose-Capillary: 148 mg/dL — ABNORMAL HIGH (ref 70–99)
Glucose-Capillary: 113 mg/dL — ABNORMAL HIGH (ref 70–99)
Glucose-Capillary: 112 mg/dL — ABNORMAL HIGH (ref 70–99)

## 2018-12-16 LAB — MAGNESIUM: MAGNESIUM: 1.8 mg/dL (ref 1.7–2.4)

## 2018-12-16 LAB — TSH: TSH: 2.597 u[IU]/mL (ref 0.350–4.500)

## 2018-12-16 LAB — I-STAT TROPONIN, ED: Troponin i, poc: 0.03 ng/mL (ref 0.00–0.08)

## 2018-12-16 LAB — CBG MONITORING, ED: Glucose-Capillary: 156 mg/dL — ABNORMAL HIGH (ref 70–99)

## 2018-12-16 LAB — MRSA PCR SCREENING: MRSA by PCR: NEGATIVE

## 2018-12-16 LAB — ECHOCARDIOGRAM COMPLETE

## 2018-12-16 LAB — BRAIN NATRIURETIC PEPTIDE: B Natriuretic Peptide: 914 pg/mL — ABNORMAL HIGH (ref 0.0–100.0)

## 2018-12-16 LAB — TROPONIN I: Troponin I: 0.05 ng/mL (ref <0.03)

## 2018-12-16 MED ORDER — DOPAMINE-DEXTROSE 3.2-5 MG/ML-% IV SOLN
0.0000 ug/kg/min | INTRAVENOUS | Status: DC
  Administered 2018-12-16: 5 ug/kg/min via INTRAVENOUS

## 2018-12-16 MED ORDER — SIMVASTATIN 20 MG PO TABS
20.0000 mg | ORAL_TABLET | Freq: Every evening | ORAL | Status: DC
  Administered 2018-12-16 – 2018-12-21 (×6): 20 mg via ORAL
  Filled 2018-12-16 (×6): qty 1

## 2018-12-16 MED ORDER — AMLODIPINE BESYLATE 10 MG PO TABS
10.0000 mg | ORAL_TABLET | Freq: Every day | ORAL | Status: DC
  Administered 2018-12-16: 10 mg via ORAL
  Filled 2018-12-16: qty 1

## 2018-12-16 MED ORDER — PANTOPRAZOLE SODIUM 40 MG PO TBEC
40.0000 mg | DELAYED_RELEASE_TABLET | Freq: Every day | ORAL | Status: DC
  Administered 2018-12-16 – 2018-12-21 (×6): 40 mg via ORAL
  Filled 2018-12-16 (×6): qty 1

## 2018-12-16 MED ORDER — SENNOSIDES-DOCUSATE SODIUM 8.6-50 MG PO TABS
1.0000 | ORAL_TABLET | Freq: Every day | ORAL | Status: DC
  Administered 2018-12-17: 1 via ORAL
  Filled 2018-12-16 (×2): qty 1

## 2018-12-16 MED ORDER — GLUCERNA SHAKE PO LIQD
237.0000 mL | Freq: Two times a day (BID) | ORAL | Status: DC
  Administered 2018-12-16 – 2018-12-21 (×9): 237 mL via ORAL

## 2018-12-16 MED ORDER — INSULIN ASPART 100 UNIT/ML ~~LOC~~ SOLN: 0.0000 [IU] | Freq: Every day | SUBCUTANEOUS | Status: DC

## 2018-12-16 MED ORDER — GLIPIZIDE 2.5 MG HALF TABLET
2.5000 mg | ORAL_TABLET | Freq: Every day | ORAL | Status: DC
  Filled 2018-12-16: qty 1

## 2018-12-16 MED ORDER — ASPIRIN 81 MG PO CHEW
81.0000 mg | CHEWABLE_TABLET | Freq: Every day | ORAL | Status: DC
  Administered 2018-12-16 – 2018-12-21 (×6): 81 mg via ORAL
  Filled 2018-12-16 (×6): qty 1

## 2018-12-16 MED ORDER — FUROSEMIDE 10 MG/ML IJ SOLN: 40.0000 mg | Freq: Once | INTRAMUSCULAR | Status: DC

## 2018-12-16 MED ORDER — LATANOPROST 0.005 % OP SOLN
1.0000 [drp] | Freq: Every day | OPHTHALMIC | Status: DC
  Administered 2018-12-17 – 2018-12-20 (×5): 1 [drp] via OPHTHALMIC
  Filled 2018-12-16 (×2): qty 2.5

## 2018-12-16 MED ORDER — CALCIUM CARBONATE-VITAMIN D 500-200 MG-UNIT PO TABS
1.0000 | ORAL_TABLET | Freq: Every day | ORAL | Status: DC
  Administered 2018-12-17 – 2018-12-21 (×5): 1 via ORAL
  Filled 2018-12-16 (×7): qty 1

## 2018-12-16 MED ORDER — GLUCAGON HCL RDNA (DIAGNOSTIC) 1 MG IJ SOLR
5.0000 mg | Freq: Once | INTRAVENOUS | Status: AC
  Administered 2018-12-16: 5 mg via INTRAVENOUS
  Filled 2018-12-16: qty 5

## 2018-12-16 MED ORDER — HEPARIN SODIUM (PORCINE) 5000 UNIT/ML IJ SOLN
5000.0000 [IU] | Freq: Three times a day (TID) | INTRAMUSCULAR | Status: DC
  Administered 2018-12-16 – 2018-12-21 (×16): 5000 [IU] via SUBCUTANEOUS
  Filled 2018-12-16 (×16): qty 1

## 2018-12-16 MED ORDER — BUMETANIDE 2 MG PO TABS
4.0000 mg | ORAL_TABLET | Freq: Every day | ORAL | Status: DC
  Filled 2018-12-16: qty 2

## 2018-12-16 MED ORDER — ALBUTEROL SULFATE (2.5 MG/3ML) 0.083% IN NEBU: 2.5000 mg | INHALATION_SOLUTION | RESPIRATORY_TRACT | Status: DC | PRN

## 2018-12-16 MED ORDER — FUROSEMIDE 10 MG/ML IJ SOLN
40.0000 mg | Freq: Once | INTRAMUSCULAR | Status: AC
  Administered 2018-12-16: 40 mg via INTRAVENOUS
  Filled 2018-12-16: qty 4

## 2018-12-16 MED ORDER — PHENYLEPHRINE HCL-NACL 10-0.9 MG/250ML-% IV SOLN
0.0000 ug/min | INTRAVENOUS | Status: DC
  Administered 2018-12-16: 20 ug/min via INTRAVENOUS
  Filled 2018-12-16: qty 250

## 2018-12-16 MED ORDER — INSULIN ASPART 100 UNIT/ML ~~LOC~~ SOLN
0.0000 [IU] | Freq: Three times a day (TID) | SUBCUTANEOUS | Status: DC
  Administered 2018-12-16 – 2018-12-19 (×4): 1 [IU] via SUBCUTANEOUS
  Administered 2018-12-20: 3 [IU] via SUBCUTANEOUS
  Administered 2018-12-21: 2 [IU] via SUBCUTANEOUS
  Administered 2018-12-21 (×2): 1 [IU] via SUBCUTANEOUS

## 2018-12-16 MED ORDER — SODIUM CHLORIDE 0.9 % IV SOLN
250.0000 mL | INTRAVENOUS | Status: DC | PRN
  Administered 2018-12-16: 250 mL via INTRAVENOUS

## 2018-12-16 MED ORDER — NITROGLYCERIN IN D5W 200-5 MCG/ML-% IV SOLN
0.0000 ug/min | INTRAVENOUS | Status: DC
  Administered 2018-12-16: 10 ug/min via INTRAVENOUS
  Administered 2018-12-16: 5 ug/min via INTRAVENOUS
  Filled 2018-12-16: qty 250

## 2018-12-16 MED ORDER — CALCIUM CARB-CHOLECALCIFEROL 600-800 MG-UNIT PO TABS: 1.0000 | ORAL_TABLET | Freq: Every day | ORAL | Status: DC

## 2018-12-16 MED ORDER — SODIUM BICARBONATE 8.4 % IV SOLN
50.0000 meq | Freq: Once | INTRAVENOUS | Status: AC
  Administered 2018-12-16: 50 meq via INTRAVENOUS
  Filled 2018-12-16: qty 50

## 2018-12-16 MED ORDER — DORZOLAMIDE HCL-TIMOLOL MAL 2-0.5 % OP SOLN
1.0000 [drp] | Freq: Two times a day (BID) | OPHTHALMIC | Status: DC
  Administered 2018-12-16: 1 [drp] via OPHTHALMIC
  Filled 2018-12-16: qty 10

## 2018-12-16 MED ORDER — CARVEDILOL 3.125 MG PO TABS
3.1250 mg | ORAL_TABLET | Freq: Two times a day (BID) | ORAL | Status: DC
  Administered 2018-12-16: 3.125 mg via ORAL
  Filled 2018-12-16 (×2): qty 1

## 2018-12-16 MED ORDER — LEVOTHYROXINE SODIUM 50 MCG PO TABS
50.0000 ug | ORAL_TABLET | Freq: Every day | ORAL | Status: DC
  Administered 2018-12-16 – 2018-12-21 (×6): 50 ug via ORAL
  Filled 2018-12-16 (×7): qty 1

## 2018-12-16 MED ORDER — SODIUM CHLORIDE 0.9% FLUSH
3.0000 mL | INTRAVENOUS | Status: DC | PRN
  Administered 2018-12-18: 3 mL via INTRAVENOUS
  Filled 2018-12-16: qty 3

## 2018-12-16 MED ORDER — ANASTROZOLE 1 MG PO TABS
1.0000 mg | ORAL_TABLET | Freq: Every day | ORAL | Status: DC
  Administered 2018-12-16 – 2018-12-21 (×6): 1 mg via ORAL
  Filled 2018-12-16 (×6): qty 1

## 2018-12-16 MED ORDER — CALCIUM GLUCONATE 10 % IV SOLN
1.0000 g | Freq: Once | INTRAVENOUS | Status: DC
  Filled 2018-12-16: qty 10

## 2018-12-16 MED ORDER — FUROSEMIDE 10 MG/ML IJ SOLN
80.0000 mg | Freq: Two times a day (BID) | INTRAMUSCULAR | Status: DC
  Administered 2018-12-16: 80 mg via INTRAVENOUS
  Filled 2018-12-16: qty 8

## 2018-12-16 MED ORDER — METOLAZONE 5 MG PO TABS
5.0000 mg | ORAL_TABLET | Freq: Once | ORAL | Status: DC
  Filled 2018-12-16: qty 1

## 2018-12-16 MED ORDER — ACETAMINOPHEN 325 MG PO TABS
325.0000 mg | ORAL_TABLET | Freq: Four times a day (QID) | ORAL | Status: DC | PRN
  Administered 2018-12-18 – 2018-12-20 (×5): 325 mg via ORAL
  Filled 2018-12-16 (×5): qty 1

## 2018-12-16 MED ORDER — SODIUM CHLORIDE 0.9% FLUSH
3.0000 mL | Freq: Two times a day (BID) | INTRAVENOUS | Status: DC
  Administered 2018-12-16 – 2018-12-21 (×10): 3 mL via INTRAVENOUS

## 2018-12-16 MED ORDER — LEVOTHYROXINE SODIUM 50 MCG PO TABS: 50.0000 ug | ORAL_TABLET | Freq: Every day | ORAL | Status: DC

## 2018-12-16 MED ORDER — CALCIUM GLUCONATE-NACL 1-0.675 GM/50ML-% IV SOLN
1.0000 g | Freq: Once | INTRAVENOUS | Status: DC
  Administered 2018-12-16: 1000 mg via INTRAVENOUS
  Filled 2018-12-16: qty 50

## 2018-12-16 NOTE — ED Provider Notes (Addendum)
[facility] EMERGENCY DEPARTMENT Provider Note   CSN: [id number] Arrival date & time: 12/16/18  0813     History   Chief Complaint Chief Complaint  Patient presents with  . Shortness of Breath    HPI Mandy Larson is a 85 y.o. female who presents the emergency department for severe shortness of breath.  She has a past medical history of CAD, combined chronic heart failure, diabetes, history of smoking, history of malignant neoplasm of the breast, chronic kidney disease, history of severe mitral regurg from previous prior infarct causing papillary muscle dysfunction and history of pulmonary hypertension. There is a level 5 caveat due respiratory distress.  Patient answers questions by nodding.  History is gathered from nursing staff who is present.  Patient apparently been having worsening shortness of breath over the past several days.  It got acutely worse this morning.  EMS was called out for respiratory distress by the patient's family members.  Patient denies chest pain.  The patient did not take her medications this morning prior to arrival.  HPI  Past Medical History:  Diagnosis Date  . Anemia   . CAD (coronary artery disease) 06/26/2014   admx with NSTEMI in 12/13 >> tx medically due to CKD  . Chronic combined systolic and diastolic CHF (congestive heart failure) ([place]) 11/08/2012   Echo 12/13:  EF 35-40, inferior and inferoseptal akinesis, grade 2 diastolic dysfunction, MAC, dysfunction/tethering of the posterior papillary muscle as a result of inferior infarct, severe MR, moderate PI, PASP 50, small circumferential pericardial effusion without hemodynamic compromise  . CKD (chronic kidney disease)   . Diabetes mellitus without complication ([place])    type 2  . Former smoker   . Heart attack ([place])   . Hyperlipidemia   . Hypertension   . Malignant neoplasm of lower-inner quadrant of right breast of female, estrogen receptor positive ([place]) 04/21/2018  . Pulmonary  hypertension ([place]) 11/08/2012  . Severe mitral regurgitation 11/08/2012   Due to papillary muscle dysfunction from prior inf infarct    Patient Active Problem List   Diagnosis Date Noted  . Malignant neoplasm of lower-inner quadrant of right breast of female, estrogen receptor positive ([place]) 04/21/2018  . Acute respiratory failure ([place]) 01/31/2018  . Gastric AVM   . GIB (gastrointestinal bleeding) 12/01/2017  . Acute gastric ulcer with hemorrhage   . Acute blood loss anemia 07/24/2017  . Melena 07/24/2017  . Hypotension 07/24/2017  . Syncope 07/24/2017  . CAD (coronary artery disease) 06/26/2014  . Chronic combined systolic and diastolic CHF (congestive heart failure) ([place]) 11/08/2012  . Pulmonary hypertension ([place]) 11/08/2012  . Mitral regurgitation 11/08/2012  . Acute pericardial effusion-small 11/08/2012  . Pulmonary edema 11/06/2012  . Chest pain 11/06/2012  . Hypertensive heart and chronic kidney disease with heart failure and stage 1 through stage 4 chronic kidney disease, or chronic kidney disease ([place]) 11/06/2012  . DM2 (diabetes mellitus, type 2) ([place]) 11/06/2012  . CKD (chronic kidney disease) stage 4, GFR 15-29 ml/min ([facility]) 11/06/2012    Past Surgical History:  Procedure Laterality Date  . COLONOSCOPY    . ESOPHAGOGASTRODUODENOSCOPY N/A 07/25/2017   Procedure: ESOPHAGOGASTRODUODENOSCOPY (EGD);  Surgeon: [name], MD;  Location: [facility];  Service: Gastroenterology;  Laterality: N/A;  . ESOPHAGOGASTRODUODENOSCOPY N/A 12/02/2017   Procedure: ESOPHAGOGASTRODUODENOSCOPY (EGD);  Surgeon: Ladene Artist, MD;  Location[facility] ENDOSCOPY;  Service: Endoscopy;  Laterality: N/A;  . EYE SURGERY       OB History   No obstetric  history on file.      Home Medications    Prior to Admission medications   Medication Sig Start Date End Date Taking? Authorizing Provider  acetaminophen (TYLENOL) 325 MG tablet Take 325 mg by mouth every 6 (six) hours as needed for mild pain  or headache.     [provider]  amLODipine (NORVASC) 10 MG tablet Take 10 mg by mouth daily. 01/04/18   [provider]  anastrozole (ARIMIDEX) 1 MG tablet Take 1 tablet (1 mg total) by mouth daily. 06/29/18   Magrinat, Virgie Dad, MD  aspirin 81 MG chewable tablet Chew 81 mg by mouth daily.    [provider]  Calcium Carb-Cholecalciferol (CALTRATE 600+D) 600-800 MG-UNIT TABS Take 1 tablet by mouth daily.    [provider]  carvedilol (COREG) 3.125 MG tablet Take 1 tablet (3.125 mg total) by mouth 2 (two) times daily with a meal. 10/23/16   Nahser, Wonda Cheng, MD  dorzolamide-timolol (COSOPT) 22.3-6.8 MG/ML ophthalmic solution Place 1 drop into both eyes 2 (two) times daily.  06/20/17   [provider]  feeding supplement, GLUCERNA SHAKE, (GLUCERNA SHAKE) LIQD Take 237 mLs by mouth 2 (two) times daily between meals. 07/26/17   Hosie Poisson, MD  furosemide (LASIX) 20 MG tablet Take 1 tablet (20 mg total) by mouth 2 (two) times daily. 02/02/18   Geradine Girt, DO  glipiZIDE (GLUCOTROL) 5 MG tablet Take 2.5 mg by mouth daily before breakfast.     [provider]  isosorbide mononitrate (IMDUR) 30 MG 24 hr tablet Take 1 tablet (30 mg total) by mouth daily. 02/28/13   Nahser, Wonda Cheng, MD  latanoprost (XALATAN) 0.005 % ophthalmic solution Place 1 drop into both eyes at bedtime.  06/15/17   [provider]  levothyroxine (SYNTHROID, LEVOTHROID) 50 MCG tablet Take 50 mcg by mouth daily.    [provider]  pantoprazole (PROTONIX) 40 MG tablet Take 1 tablet (40 mg total) by mouth daily. 02/02/18 08/09/18  Geradine Girt, DO  senna-docusate (SENOKOT-S) 8.6-50 MG tablet Take 1 tablet by mouth at bedtime. 12/03/17   Doreatha Lew, MD  simvastatin (ZOCOR) 20 MG tablet Take 20 mg by mouth every evening.    [provider]    Family History Family History  Problem Relation Age of Onset  . Hypertension Father     Social  History Social History   Tobacco Use  . Smoking status: Former Smoker    Types: Cigarettes    Last attempt to quit: 11/08/1993    Years since quitting: 25.1  . Smokeless tobacco: Never Used  Substance Use Topics  . Alcohol use: No  . Drug use: No     Allergies   Patient has no known allergies.   Review of Systems Review of Systems  Unable to review systems secondary to acuity of condition. Physical Exam Updated Vital Signs Temp (!) 97.3 F (36.3 C) (Oral)   Physical Exam Vitals signs and nursing note reviewed.  Constitutional:      General: She is not in acute distress.    Appearance: She is well-developed. She is not diaphoretic.  HENT:     Head: Normocephalic and atraumatic.  Eyes:     General: No scleral icterus.    Conjunctiva/sclera: Conjunctivae normal.  Neck:     Musculoskeletal: Normal range of motion.  Cardiovascular:     Rate and Rhythm: Normal rate and regular rhythm.     Heart sounds: Normal heart sounds. No  murmur. No friction rub. No gallop.   Pulmonary:     Effort: Tachypnea, accessory muscle usage and respiratory distress present.     Comments: Diminished breath sounds, poor air movement Abdominal:     General: Bowel sounds are normal. There is no distension.     Palpations: Abdomen is soft. There is no mass.     Tenderness: There is no abdominal tenderness. There is no guarding.  Skin:    General: Skin is warm and dry.  Neurological:     Mental Status: She is alert and oriented to person, place, and time.  Psychiatric:        Behavior: Behavior normal.      ED Treatments / Results  Labs (all labs ordered are listed, but only abnormal results are displayed) Labs Reviewed  POCT I-STAT EG7 - Abnormal; Notable for the following components:      Result Value   pO2, Ven 50.0 (*)    Acid-base deficit 3.0 (*)    Potassium >8.5 (*)    Calcium, Ion 1.02 (*)    HCT 29.0 (*)    Hemoglobin 9.9 (*)    All other components within normal limits   BRAIN NATRIURETIC PEPTIDE  CBC WITH DIFFERENTIAL/PLATELET  COMPREHENSIVE METABOLIC PANEL  MAGNESIUM  I-STAT TROPONIN, ED    EKG EKG Interpretation  Date/Time:  Thursday December 16 2018 08:18:20 EST Ventricular Rate:  75 PR Interval:    QRS Duration: 184 QT Interval:  494 QTC Calculation: 552 R Axis:   -89 Text Interpretation:  Sinus rhythm Right bundle branch block LVH with IVCD and secondary repol abnrm Prolonged QT interval Baseline wander in lead(s) V1 No significant change since last tracing Confirmed by Wandra Arthurs [phone]) on 12/16/2018 8:27:12 AM   Radiology No results found.  Procedures .Critical Care Performed by: Margarita Mail, PA-C Authorized by: Margarita Mail, PA-C   Critical care provider statement:    Critical care time (minutes):  50   Critical care was necessary to treat or prevent imminent or life-threatening deterioration of the following conditions:  Respiratory failure and metabolic crisis   Critical care was time spent personally by me on the following activities:  Review of old charts, re-evaluation of patient's condition, pulse oximetry, ordering and review of radiographic studies, ordering and review of laboratory studies, ordering and performing treatments and interventions, obtaining history from patient or surrogate, interpretation of cardiac output measurements, examination of patient, evaluation of patient's response to treatment, discussions with consultants and development of treatment plan with patient or surrogate   (including critical care time)  Medications Ordered in ED Medications  nitroGLYCERIN 50 mg in dextrose 5 % 250 mL (0.2 mg/mL) infusion (has no administration in time range)     Initial Impression / Assessment and Plan / ED Course  I have reviewed the triage vital signs and the nursing notes.  Pertinent labs & imaging results that were available during my care of the patient were reviewed by me and considered in my medical  decision making (see chart for details).  Clinical Course as of Dec 16 921  Thu Dec 16, 2018  0852 Potassium(!!): >8.5 [AH]  9147 Patient's EKG 7 panel potassium markedly elevated with elevated serum creatinine however this appears to be patient's baseline creatinine.  Given patient's abnormal EKG with prolonged QT and QRS which was wider than normal today I discussed this with Dr. Darl Householder and we electively began treatment for hyperkalemia.  Serum CMP returned with normal potassium and patient EG  7 may have been hemolyzed.  Discussed the case with Dr. Hollie Salk of nephrology who will consult on the patient and asked that we add a renal function panel which I have applied.   [AH]    Clinical Course User Index [AH] Margarita Mail, PA-C    Patient with acute CHF exacerbation, chronic kidney disease.  Seen and shared visit with Dr. Darl Householder.  Patient started on BiPAP with significant improvement in her symptoms.  After initial i-STAT showed significantly elevated potassium and EKG revealed widening of her QRS we began treatment for hyperkalemia however this appears to be lab error likely from hemolysis.  The patient was given bicarb and calcium gluconate drip was discontinued.  The patient however continued to have dyspnea.  She will be admitted for CHF exacerbation.  The renal service has consulted on the patient.  Final Clinical Impressions(s) / ED Diagnoses   Final diagnoses:  None    ED Discharge Orders    None       Margarita Mail, PA-C 12/17/18 1015    Drenda Freeze, MD 12/18/18 [place], [place], PA-C 01/13/19 5329    Drenda Freeze, MD 01/13/19 [phone]

## 2018-12-16 NOTE — Consult Note (Signed)
NAME:  Mandy Larson, MRN:  [id number], DOB:  28-Jan-1930, LOS: 0 ADMISSION DATE:  12/16/2018, CONSULTATION DATE:  12/16/18 REFERRING MD:  Sloan Leiter  CHIEF COMPLAINT:  Cardiac Arrest   Brief History   Mandy Larson is a 85 y.o. female who was admitted 2/6 with dyspnea felt to be due to acute pulmonary edema.  She was treated with extra lasix per nephrology (underlying hx of CKD IV).  Later that evening, she had a bradycardic arrest.  She received CPR as well as atropine x 2 doses which only helped improve HR temporarily.  She was started on dopamine infusion and was transferred to the ICU.  History of present illness   Pt is encephelopathic; therefore, this HPI is obtained from chart review. Mandy Larson is a 85 y.o. female who has a PMH as outlined below including but not limited to CKD IV not felt to be a good candidate for dialysis, HTN, HLD, sCHF (Echo from 12/16/18 with EF 50-55%), DM, breast CA currently on anastrazole (see "past medical history" for rest).  She presented to [facility] ED 2/6 with dyspnea felt to be due to pulmonary edema.  She was evaluated by nephrology and was given extra lasix and was also started on BiPAP.  She was also noted to be hypertensive and was started on a nitro drip.  Later that night, she had bradycardia leading to cardiac arrest.  She received 2 doses of atropine which only temporarily improved bradycardia; therefore, she was started on dopamine infusion and was transferred to the ICU.  Of note, on admission, she was listed as a DNR / DNI which was confirmed by son.  Per [facility] NP, during her bradycardia, son and granddaughter at bedside rescinded this and informed RN that they wanted "everything done".  After arrival to ICU, Dr. Gilford Raid had discussion with pt's son and granddaughter.  Decision was made to continue with dopamine gtt, but to otherwise limit care and honor pt's wishes of DNR / DNI.  We will use BiPAP in the event of respiratory distress.   Past Medical History    CKD IV not felt to be a good candidate for dialysis, HTN, HLD, sCHF (Echo from 12/16/18 with EF 50-55%), DM, anemia, breast CA currently on anastrazole.  Significant Hospital Events   2/6 > admit.  Consults:  Nephrology. PCCM.  Procedures:  None.  Significant Diagnostic Tests:  2/6 > admit, transferred to ICU after bradycardic arrest.  Micro Data:  Blood.  Antimicrobials:  None.   Interim history/subjective:  On NRB. Awake but does not answer questions or follow commands.  Objective:  Blood pressure (!) 93/48, pulse 66, temperature 97.7 F (36.5 C), temperature source Oral, resp. rate 12, height 5\' 3"  (1.6 m), weight 64 kg, SpO2 99 %.    FiO2 (%):  [50 %] 50 %   Intake/Output Summary (Last 24 hours) at 12/16/2018 2110 Last data filed at 12/16/2018 1752 Gross per 24 hour  Intake 11.26 ml  Output 150 ml  Net -138.74 ml   Filed Weights   12/16/18 1613  Weight: 64 kg    Examination: General: Elderly female, chronically ill appearing, in NAD. Neuro: Awake but does not follow commands. HEENT: [place]/AT. Sclerae anicteric.  NRB on place. Cardiovascular: RRR on 2 mcg/kg/mindopamine, no M/R/G.  Lungs: Respirations even and unlabored.  CTA bilaterally, No W/R/R.   Abdomen: BS x 4, soft, NT/ND.  Musculoskeletal: No gross deformities, 1+ edema.  Skin: Intact, warm, no rashes.  Assessment & Plan:  Bradycardia - unclear etiology.  ? Decreased clearance of carvedilol due to CKD.  Gradually resolving, dopamine being weaned off. - Continue to wean dopamine to off. - F/u on TSH.  Acute hypoxic respiratory failure due to pulmonary edema. - Resume BiPAP. - Add 1 time dose metolazone + bumex. - Additional lasix restricted due to soft BP's. - Continue scheduled lasix per nephrology.  HTN - resolved.  Was temporarily on nitro gtt. - Continue to monitor clinically. - Hold preadmission amlodipine, carvedilol.  DM. - Continue SSI, glipizide.  Breast CA. - Continue  anastrozole. - F/u with oncology as an outpatient.  Goals of care. - Continue dopamine, but otherwise, DNR / DNI.   Best Practice:  Diet: 2g Sodium restricted. Pain/Anxiety/Delirium protocol (if indicated): N/A. VAP protocol (if indicated): N/A. DVT prophylaxis: SCD's / Heparin. GI prophylaxis: N/A. Glucose control: SSI / glipizide. Mobility: Bedrest. Code Status: Limited. Family Communication: Son and granddaughter updated at bedside. Disposition: ICU.  Labs   CBC: Recent Labs  Lab 12/16/18 0829 12/16/18 0837  WBC 8.0  --   NEUTROABS 5.5  --   HGB 9.2* 9.9*  HCT 31.6* 29.0*  MCV 90.8  --   PLT 201  --    Basic Metabolic Panel: Recent Labs  Lab 12/16/18 0829 12/16/18 0837 12/16/18 0839 12/16/18 0857 12/16/18 1006  NA 142 138  --   --  146*  K 4.2 >8.5*  --   --  4.3  CL 107  --   --   --  108  CO2 24  --   --   --  20*  GLUCOSE 179*  --   --   --  149*  BUN 71*  --   --   --  74*  CREATININE 5.39*  --   --  5.60* 5.28*  CALCIUM 8.8*  --   --   --  9.1  MG  --   --  1.8  --   --   PHOS  --   --   --   --  5.9*   GFR: Estimated Creatinine Clearance: 6.9 mL/min (A) (by C-G formula based on SCr of 5.28 mg/dL (H)). Recent Labs  Lab 12/16/18 0829  WBC 8.0   Liver Function Tests: Recent Labs  Lab 12/16/18 0829 12/16/18 1006  AST 26  --   ALT 20  --   ALKPHOS 60  --   BILITOT 0.6  --   PROT 7.4  --   ALBUMIN 3.8 3.6   No results for input(s): LIPASE, AMYLASE in the last 168 hours. No results for input(s): AMMONIA in the last 168 hours. ABG    Component Value Date/Time   PHART 7.280 (L) 12/16/2018 1610   PCO2ART 56.5 (H) 12/16/2018 1610   PO2ART 81.9 (L) 12/16/2018 1610   HCO3 25.7 12/16/2018 1610   TCO2 27 12/16/2018 0837   ACIDBASEDEF 0.3 12/16/2018 1610   O2SAT 96.4 12/16/2018 1610    Coagulation Profile: No results for input(s): INR, PROTIME in the last 168 hours. Cardiac Enzymes: No results for input(s): CKTOTAL, CKMB, CKMBINDEX,  TROPONINI in the last 168 hours. HbA1C: Hgb A1c MFr Bld  Date/Time Value Ref Range Status  05/27/2018 09:08 AM 6.2 (H) 4.8 - 5.6 % Final    Comment:    (NOTE) Pre diabetes:          5.7%-6.4% Diabetes:              >6.4% Glycemic control for   <  7.0% adults with diabetes   07/24/2017 02:36 PM 5.7 (H) 4.8 - 5.6 % Final    Comment:    (NOTE) Pre diabetes:          5.7%-6.4% Diabetes:              >6.4% Glycemic control for   <7.0% adults with diabetes    CBG: Recent Labs  Lab 12/16/18 0907 12/16/18 1756 12/16/18 2005  GLUCAP 156* 148* 113*    Review of Systems:   Unable to obtain as pt is encephalopathic.  Past medical history  She,  has a past medical history of Acute respiratory distress (12/16/2018), Anemia, CAD (coronary artery disease) (06/26/2014), Chronic combined systolic and diastolic CHF (congestive heart failure) ([place]) (11/08/2012), CKD (chronic kidney disease), Diabetes mellitus without complication ([place]), Former smoker, Heart attack ([place]), Hyperlipidemia, Hypertension, Malignant neoplasm of lower-inner quadrant of right breast of female, estrogen receptor positive ([place]) (04/21/2018), Pulmonary hypertension ([place]) (11/08/2012), and Severe mitral regurgitation (11/08/2012).   Surgical History    Past Surgical History:  Procedure Laterality Date  . COLONOSCOPY    . ESOPHAGOGASTRODUODENOSCOPY N/A 07/25/2017   Procedure: ESOPHAGOGASTRODUODENOSCOPY (EGD);  Surgeon: [name], MD;  Location: [facility] [facility];  Service: Gastroenterology;  Laterality: N/A;  . ESOPHAGOGASTRODUODENOSCOPY N/A 12/02/2017   Procedure: ESOPHAGOGASTRODUODENOSCOPY (EGD);  Surgeon: Ladene Artist, MD;  Location[facility] ENDOSCOPY;  Service: Endoscopy;  Laterality: N/A;  . EYE SURGERY       Social History   reports that she quit smoking about 25 years ago. Her smoking use included cigarettes. She has never used smokeless tobacco. She reports that she does not drink alcohol or use drugs.   Family  history   Her family history includes Hypertension in her father.   Allergies No Known Allergies   Home meds  Prior to Admission medications   Medication Sig Start Date End Date Taking? Authorizing Provider  acetaminophen (TYLENOL) 325 MG tablet Take 325 mg by mouth every 6 (six) hours as needed for mild pain or headache.    Yes [provider]  amLODipine (NORVASC) 10 MG tablet Take 10 mg by mouth daily. 01/04/18  Yes [provider]  anastrozole (ARIMIDEX) 1 MG tablet Take 1 tablet (1 mg total) by mouth daily. 06/29/18  Yes Magrinat, Virgie Dad, MD  aspirin 81 MG chewable tablet Chew 81 mg by mouth daily.   Yes [provider]  Calcium Carb-Cholecalciferol (CALTRATE 600+D) 600-800 MG-UNIT TABS Take 1 tablet by mouth daily.   Yes [provider]  carvedilol (COREG) 3.125 MG tablet Take 1 tablet (3.125 mg total) by mouth 2 (two) times daily with a meal. 10/23/16  Yes Nahser, Wonda Cheng, MD  dorzolamide-timolol (COSOPT) 22.3-6.8 MG/ML ophthalmic solution Place 1 drop into both eyes 2 (two) times daily.  06/20/17  Yes [provider]  feeding supplement, GLUCERNA SHAKE, (GLUCERNA SHAKE) LIQD Take 237 mLs by mouth 2 (two) times daily between meals. 07/26/17  Yes Hosie Poisson, MD  furosemide (LASIX) 20 MG tablet Take 1 tablet (20 mg total) by mouth 2 (two) times daily. 02/02/18  Yes Vann, Jessica U, DO  glipiZIDE (GLUCOTROL) 5 MG tablet Take 2.5 mg by mouth daily before breakfast.    Yes [provider]  isosorbide mononitrate (IMDUR) 30 MG 24 hr tablet Take 1 tablet (30 mg total) by mouth daily. 02/28/13  Yes Nahser, Wonda Cheng, MD  latanoprost (XALATAN) 0.005 % ophthalmic solution Place 1 drop into both eyes at bedtime.  06/15/17  Yes [provider]  levothyroxine (SYNTHROID, LEVOTHROID) 50 MCG tablet Take 50 mcg by mouth daily.   Yes [provider]  pantoprazole (PROTONIX) 40 MG tablet Take 1 tablet (40 mg total) by mouth daily.  02/02/18 12/16/18 Yes Vann, Jessica U, DO  potassium chloride (K-DUR,KLOR-CON) 10 MEQ tablet Take 10 mEq by mouth daily.   Yes [provider]  RESTASIS 0.05 % ophthalmic emulsion Place 1 drop into both eyes every 12 (twelve) hours. 08/30/18  Yes [provider]  senna-docusate (SENOKOT-S) 8.6-50 MG tablet Take 1 tablet by mouth at bedtime. 12/03/17  Yes Patrecia Pour, Christean Grief, MD  simvastatin (ZOCOR) 20 MG tablet Take 20 mg by mouth every evening.   Yes [provider]    Critical care time: 35 min.    Montey Hora, [facility] Pager: [phone].  If no answer, [phone] 12/16/2018, 9:10 PM

## 2018-12-16 NOTE — ED Notes (Signed)
Pt back from echo

## 2018-12-16 NOTE — ED Notes (Signed)
CBG 153 

## 2018-12-16 NOTE — Progress Notes (Signed)
Chaplain responded to Code Blue at 7:33 PM.  Mandy Larson, who was raised by patient was in the hallway.  Chaplain provided ministry of presence until pt's son [name] ([name]'s father) arrived. Offered prayer.  Patient to be moved to an ICU.  Will continue to be available. Tamsen Snider Pager [phone]

## 2018-12-16 NOTE — ED Notes (Signed)
Consulting MD at bedside

## 2018-12-16 NOTE — Consult Note (Signed)
[facility]  HISTORY AND PHYSICAL  Mandy Larson is an 85 y.o. female.    Consult reason: Diuretic management  HPI: 85 year old African-American female who presents with gradually worsening shortness of breath and weakness over the last 2 weeks.  She has had shortness of breath with ambulation and has had symptoms consistent with nocturnal dyspnea.  She received one 40 mg IV dose of Lasix.  She has had "many trips to the restroom".  Endorses greatly increased urinary output.  She takes p.o. Lasix 20 mg twice daily.  Patient has known CKD stage V, she sees Dr. Moshe Cipro with [facility].  Nephrology consulted for diuretic recommendations, in the context of patient's CKD stage V.  Of note patient had markedly elevated potassium on point-of-care test.  Received sodium bicarb, serum BMP showed potassium 4.3.  Patient initially on BiPAP, but has improved greatly.  On 5 L nasal cannula during consultation at bedside.  Patient has had echocardiogram performed, read pending.  PMH: Past Medical History:  Diagnosis Date  . Anemia   . CAD (coronary artery disease) 06/26/2014   admx with NSTEMI in 12/13 >> tx medically due to CKD  . Chronic combined systolic and diastolic CHF (congestive heart failure) ([place]) 11/08/2012   Echo 12/13:  EF 35-40, inferior and inferoseptal akinesis, grade 2 diastolic dysfunction, MAC, dysfunction/tethering of the posterior papillary muscle as a result of inferior infarct, severe MR, moderate PI, PASP 50, small circumferential pericardial effusion without hemodynamic compromise  . CKD (chronic kidney disease)   . Diabetes mellitus without complication ([place])    type 2  . Former smoker   . Heart attack ([place])   . Hyperlipidemia   . Hypertension   . Malignant neoplasm of lower-inner quadrant of right breast of female, estrogen receptor positive ([place]) 04/21/2018  . Pulmonary hypertension ([place]) 11/08/2012  . Severe mitral regurgitation 11/08/2012   Due to  papillary muscle dysfunction from prior inf infarct   PSH: Past Surgical History:  Procedure Laterality Date  . COLONOSCOPY    . ESOPHAGOGASTRODUODENOSCOPY N/A 07/25/2017   Procedure: ESOPHAGOGASTRODUODENOSCOPY (EGD);  Surgeon: [name], MD;  Location: [facility];  Service: Gastroenterology;  Laterality: N/A;  . ESOPHAGOGASTRODUODENOSCOPY N/A 12/02/2017   Procedure: ESOPHAGOGASTRODUODENOSCOPY (EGD);  Surgeon: Ladene Artist, MD;  Location[facility] ENDOSCOPY;  Service: Endoscopy;  Laterality: N/A;  . EYE SURGERY      Past Medical History:  Diagnosis Date  . Anemia   . CAD (coronary artery disease) 06/26/2014   admx with NSTEMI in 12/13 >> tx medically due to CKD  . Chronic combined systolic and diastolic CHF (congestive heart failure) ([place]) 11/08/2012   Echo 12/13:  EF 35-40, inferior and inferoseptal akinesis, grade 2 diastolic dysfunction, MAC, dysfunction/tethering of the posterior papillary muscle as a result of inferior infarct, severe MR, moderate PI, PASP 50, small circumferential pericardial effusion without hemodynamic compromise  . CKD (chronic kidney disease)   . Diabetes mellitus without complication ([place])    type 2  . Former smoker   . Heart attack ([place])   . Hyperlipidemia   . Hypertension   . Malignant neoplasm of lower-inner quadrant of right breast of female, estrogen receptor positive ([place]) 04/21/2018  . Pulmonary hypertension ([place]) 11/08/2012  . Severe mitral regurgitation 11/08/2012   Due to papillary muscle dysfunction from prior inf infarct    Medications:  I have reviewed the patient's current medications.  (Not in a hospital admission)   ALLERGIES:  No Known Allergies  FAM HX: Family  History  Problem Relation Age of Onset  . Hypertension Father     Social History:   reports that she quit smoking about 25 years ago. Her smoking use included cigarettes. She has never used smokeless tobacco. She reports that she does not drink alcohol or use  drugs.  ROS: Review of Systems  Constitutional: Negative for chills and fever.  Respiratory: Positive for shortness of breath.   Cardiovascular: Negative for chest pain and palpitations.  Gastrointestinal: Negative for abdominal pain, diarrhea, nausea and vomiting.  Musculoskeletal: Negative for myalgias.  Neurological: Positive for weakness.  Psychiatric/Behavioral: Negative for depression and suicidal ideas.    Blood pressure (!) 142/69, pulse (!) 58, temperature (!) 97.3 F (36.3 C), temperature source Oral, resp. rate 13, SpO2 100 %. PHYSICAL EXAM: Physical Exam  Constitutional: She is oriented to person, place, and time. She appears well-developed and well-nourished.  Eyes: Pupils are equal, round, and reactive to light. Right eye exhibits no discharge. Left eye exhibits no discharge.  Neck: Normal range of motion. No thyromegaly present.  Cardiovascular: Normal rate. Exam reveals no friction rub.  No murmur heard. Trace pitting edema bilateral lower extremity  Respiratory:  Bilateral crackles in lung bases  GI: Soft. She exhibits no distension. There is no abdominal tenderness.  Musculoskeletal: Normal range of motion.        General: No edema.  Neurological: She is alert and oriented to person, place, and time. No cranial nerve deficit.  Skin: Skin is warm. No erythema.  Psychiatric: She has a normal mood and affect.     Results for orders placed or performed during the hospital encounter of 12/16/18 (from the past 48 hour(s))  Brain natriuretic peptide     Status: Abnormal   Collection Time: 12/16/18  8:29 AM  Result Value Ref Range   B Natriuretic Peptide 914.0 (H) 0.0 - 100.0 pg/mL    Comment: Performed at [facility] [address] [place], [place] [address]  CBC with Differential     Status: Abnormal   Collection Time: 12/16/18  8:29 AM  Result Value Ref Range   WBC 8.0 4.0 - 10.5 K/uL   RBC 3.48 (L) 3.87 - 5.11 MIL/uL   Hemoglobin 9.2 (L) 12.0 - 15.0  g/dL   HCT 31.6 (L) 36.0 - 46.0 %   MCV 90.8 80.0 - 100.0 fL   MCH 26.4 26.0 - 34.0 pg   MCHC 29.1 (L) 30.0 - 36.0 g/dL   RDW 15.6 (H) 11.5 - 15.5 %   Platelets 201 150 - 400 K/uL   nRBC 0.0 0.0 - 0.2 %   Neutrophils Relative % 70 %   Neutro Abs 5.5 1.7 - 7.7 K/uL   Lymphocytes Relative 16 %   Lymphs Abs 1.3 0.7 - 4.0 K/uL   Monocytes Relative 11 %   Monocytes Absolute 0.9 0.1 - 1.0 K/uL   Eosinophils Relative 3 %   Eosinophils Absolute 0.3 0.0 - 0.5 K/uL   Basophils Relative 0 %   Basophils Absolute 0.0 0.0 - 0.1 K/uL   Immature Granulocytes 0 %   Abs Immature Granulocytes 0.02 0.00 - 0.07 K/uL    Comment: Performed at [facility], [place] [address], [place], [place] [address]  Comprehensive metabolic panel     Status: Abnormal   Collection Time: 12/16/18  8:29 AM  Result Value Ref Range   Sodium 142 135 - 145 mmol/L   Potassium 4.2 3.5 - 5.1 mmol/L   Chloride 107  98 - 111 mmol/L   CO2 24 22 - 32 mmol/L   Glucose, Bld 179 (H) 70 - 99 mg/dL   BUN 71 (H) 8 - 23 mg/dL   Creatinine, Ser 5.39 (H) 0.44 - 1.00 mg/dL   Calcium 8.8 (L) 8.9 - 10.3 mg/dL   Total Protein 7.4 6.5 - 8.1 g/dL   Albumin 3.8 3.5 - 5.0 g/dL   AST 26 15 - 41 U/L   ALT 20 0 - 44 U/L   Alkaline Phosphatase 60 38 - 126 U/L   Total Bilirubin 0.6 0.3 - 1.2 mg/dL   GFR calc non Af Amer 7 (L) >60 mL/min   GFR calc Af Amer 8 (L) >60 mL/min   Anion gap 11 5 - 15    Comment: Performed at [facility] [address] [place], [place] [address]  I-Stat Troponin, ED (not at [facility])     Status: None   Collection Time: 12/16/18  8:35 AM  Result Value Ref Range   Troponin i, poc 0.03 0.00 - 0.08 ng/mL   Comment 3            Comment: Due to the release kinetics of cTnI, a negative result within the first hours of the onset of symptoms does not rule out myocardial infarction with certainty. If myocardial infarction is still suspected, repeat the test at appropriate intervals.   POCT I-Stat EG7     Status:  Abnormal   Collection Time: 12/16/18  8:37 AM  Result Value Ref Range   pH, Ven 7.250 7.250 - 7.430   pCO2, Ven 56.8 44.0 - 60.0 mmHg   pO2, Ven 50.0 (H) 32.0 - 45.0 mmHg   Bicarbonate 24.9 20.0 - 28.0 mmol/L   TCO2 27 22 - 32 mmol/L   O2 Saturation 78.0 %   Acid-base deficit 3.0 (H) 0.0 - 2.0 mmol/L   Sodium 138 135 - 145 mmol/L   Potassium >8.5 (HH) 3.5 - 5.1 mmol/L   Calcium, Ion 1.02 (L) 1.15 - 1.40 mmol/L   HCT 29.0 (L) 36.0 - 46.0 %   Hemoglobin 9.9 (L) 12.0 - 15.0 g/dL   Patient temperature HIDE    Sample type VENOUS    Comment NOTIFIED PHYSICIAN   Magnesium     Status: None   Collection Time: 12/16/18  8:39 AM  Result Value Ref Range   Magnesium 1.8 1.7 - 2.4 mg/dL    Comment: Performed at [facility], [place] [address] [place] [address], [place], [place] [address]  I-stat Creatinine, ED     Status: Abnormal   Collection Time: 12/16/18  8:57 AM  Result Value Ref Range   Creatinine, Ser 5.60 (H) 0.44 - 1.00 mg/dL  CBG monitoring, ED     Status: Abnormal   Collection Time: 12/16/18  9:07 AM  Result Value Ref Range   Glucose-Capillary 156 (H) 70 - 99 mg/dL  Renal function panel     Status: Abnormal   Collection Time: 12/16/18 10:06 AM  Result Value Ref Range   Sodium 146 (H) 135 - 145 mmol/L   Potassium 4.3 3.5 - 5.1 mmol/L   Chloride 108 98 - 111 mmol/L   CO2 20 (L) 22 - 32 mmol/L   Glucose, Bld 149 (H) 70 - 99 mg/dL   BUN 74 (H) 8 - 23 mg/dL   Creatinine, Ser 5.28 (H) 0.44 - 1.00 mg/dL   Calcium 9.1 8.9 - 10.3 mg/dL   Phosphorus 5.9 (H) 2.5 - 4.6 mg/dL  Albumin 3.6 3.5 - 5.0 g/dL   GFR calc non Af Amer 7 (L) >60 mL/min   GFR calc Af Amer 8 (L) >60 mL/min   Anion gap 18 (H) 5 - 15    Comment: Performed at [facility] [address], [place], [place] [address]    Dg Chest Portable 1 View  Result Date: 12/16/2018 CLINICAL DATA:  Respiratory distress. EXAM: PORTABLE CHEST 1 VIEW COMPARISON:  01/31/2018. FINDINGS: Cardiomegaly with diffuse increased bilateral from  interstitial prominence. Cardiac B-lines noted. Bilateral pleural effusions. Findings consistent with CHF. Bilateral carotid vascular calcification. IMPRESSION: 1. Congestive heart failure with bilateral pulmonary interstitial edema bilateral pleural effusions. 2.  Carotid vascular disease. Electronically Signed   By: Marcello Moores  Register   On: 12/16/2018 08:53    Assessment/Plan 85 year old African-American female who presents for shortness of breath.  Past medical history significant for chronic combined systolic and diastolic heart failure, CKD stage V, breast cancer.  Imaging consistent with volume overload possibly due to CHF exacerbation.  Patient has greatly improved after some diuresis on IV Lasix.  Currently requiring 5 L nasal cannula, earlier required BiPAP.  Creatinine actually decreased a little bit with IV Lasix.  Patient with mild elevation from baseline.  Patient adamant that she would not consider dialysis if needed.  Shortness of breath likely secondary to volume overload -Likely give additional dose IV Lasix, kidney attending attestation -Limit nephrotoxic meds -Oxygen and BiPAP per primary -Daily BMP -Follow-up echocardiogram read  CKD stage V -Avoid nephrotoxic meds -Continue current management for hypertension -No dialysis indication -Follow-up with normal nephrologist after DC  Combined systolic and diastolic heart failure -Likely give additional dose of IV Lasix as above -Follow-up echocardiogram read -Per primary  Hypertension -Amlodipine 10 mg -Carvedilol 3.25 mg twice daily -Status post Lasix 40 mg IV x1, likely give second dose as above  Guadalupe Dawn MD PGY-2 Family Medicine Resident

## 2018-12-16 NOTE — Progress Notes (Signed)
Echocardiogram 2D Echocardiogram has been performed.  Mandy Larson 12/16/2018, 1:50 PM

## 2018-12-16 NOTE — ED Triage Notes (Signed)
Pt in from home via [facility], per report pt c/o SOB onset for a couple weeks worsening upon wakening this am, denies CP, n/v/d, pt 82 % on RA, pt to ED with NRB, A&O x4

## 2018-12-16 NOTE — Progress Notes (Addendum)
Paged by bedside RN regarding pt in an active code situation as the family changed the pts code status during a bradycardic event. Code team responded and pt required 2 rounds of Atropine and ended up transcutaneously paced while also being placed on an Atropine gtt. Pt was subsequently transferred to 54M and PCCM was consulted. PCCM to see pt at the bedside. Discussed with family patients code status and they requested the pt be a "full code" at this time.  Arby Barrette AGPCNP-BC, AGNP-C [facility] Pager [phone]

## 2018-12-16 NOTE — H&P (Signed)
History and Physical    Mandy Larson IHK:[id number] DOB: 01-25-1930 DOA: 12/16/2018  PCP: [name], MD   Patient coming from: Home.  I have personally briefly reviewed patient's old medical records available.   Chief Complaint: Shortness of breath.  HPI: Mandy Larson is a 85 y.o. female with medical history significant of CKD stage IV, chronic combined heart failure with EF of 35%, type 2 diabetes, stage I breast cancer on anastrozole, history of MI, mitral regurgitation and pulmonary hypertension, progressive kidney disease who presents to the emergency room with shortness of breath gradually worsening for the last 2 weeks.  According to the patient, she had not been feeling well for the last 2 weeks.  She is not on oxygen at home.  She has been getting short of breath on ambulation since 2 weeks.  She will have low energy.  Today morning, she was visited by her son who saw her short of breath and decided to bring her to the hospital.  When he tried to ambulate her she was in severe respiratory distress so he called EMS to bring her here.  Patient denies any weight gain.  She has been on Lasix.  She states that she is frequently going to bathroom.  Denies any fever or chills.  Denies any cough or cold symptoms.  No recent flulike symptoms.  Denies any chest pain.  Denies any wheezing.  She was going to follow-up with her nephrologist on 10th of this month.  Patient has been followed by nephrology, on conservative management.  She was thought to be not a dialysis candidate. ED Course: Blood pressures are fairly stable.  Hemoglobin is at around his baseline.  Point-of-care potassium was 8.5.panic results prompted administration of bicarbonate, serum potassium is 4.2.  Her creatinine is 5.39.  Her creatinine has been remaining about 5 on outpatient visits.  A chest x-ray shows bilateral increased vascular prominence and minimal bilateral effusions.  Review of Systems: As per HPI otherwise 10 point  review of systems negative.    Past Medical History:  Diagnosis Date  . Anemia   . CAD (coronary artery disease) 06/26/2014   admx with NSTEMI in 12/13 >> tx medically due to CKD  . Chronic combined systolic and diastolic CHF (congestive heart failure) ([place]) 11/08/2012   Echo 12/13:  EF 35-40, inferior and inferoseptal akinesis, grade 2 diastolic dysfunction, MAC, dysfunction/tethering of the posterior papillary muscle as a result of inferior infarct, severe MR, moderate PI, PASP 50, small circumferential pericardial effusion without hemodynamic compromise  . CKD (chronic kidney disease)   . Diabetes mellitus without complication ([place])    type 2  . Former smoker   . Heart attack ([place])   . Hyperlipidemia   . Hypertension   . Malignant neoplasm of lower-inner quadrant of right breast of female, estrogen receptor positive ([place]) 04/21/2018  . Pulmonary hypertension ([place]) 11/08/2012  . Severe mitral regurgitation 11/08/2012   Due to papillary muscle dysfunction from prior inf infarct    Past Surgical History:  Procedure Laterality Date  . COLONOSCOPY    . ESOPHAGOGASTRODUODENOSCOPY N/A 07/25/2017   Procedure: ESOPHAGOGASTRODUODENOSCOPY (EGD);  Surgeon: [name], MD;  Location: [facility];  Service: Gastroenterology;  Laterality: N/A;  . ESOPHAGOGASTRODUODENOSCOPY N/A 12/02/2017   Procedure: ESOPHAGOGASTRODUODENOSCOPY (EGD);  Surgeon: Ladene Artist, MD;  Location[facility] END[facility]COPY;  Service: Endoscopy;  Laterality: N/A;  . EYE SURGERY       reports that she quit smoking about 25 years ago. Her smoking use  included cigarettes. She has never used smokeless tobacco. She reports that she does not drink alcohol or use drugs.  No Known Allergies  Family History  Problem Relation Age of Onset  . Hypertension Father      Prior to Admission medications   Medication Sig Start Date End Date Taking? Authorizing Provider  acetaminophen (TYLENOL) 325 MG tablet Take 325 mg by mouth every 6  (six) hours as needed for mild pain or headache.     [provider]  amLODipine (NORVASC) 10 MG tablet Take 10 mg by mouth daily. 01/04/18   [provider]  anastrozole (ARIMIDEX) 1 MG tablet Take 1 tablet (1 mg total) by mouth daily. 06/29/18   Magrinat, Virgie Dad, MD  aspirin 81 MG chewable tablet Chew 81 mg by mouth daily.    [provider]  Calcium Carb-Cholecalciferol (CALTRATE 600+D) 600-800 MG-UNIT TABS Take 1 tablet by mouth daily.    [provider]  carvedilol (COREG) 3.125 MG tablet Take 1 tablet (3.125 mg total) by mouth 2 (two) times daily with a meal. 10/23/16   Nahser, Wonda Cheng, MD  dorzolamide-timolol (COSOPT) 22.3-6.8 MG/ML ophthalmic solution Place 1 drop into both eyes 2 (two) times daily.  06/20/17   [provider]  feeding supplement, GLUCERNA SHAKE, (GLUCERNA SHAKE) LIQD Take 237 mLs by mouth 2 (two) times daily between meals. 07/26/17   Hosie Poisson, MD  furosemide (LASIX) 20 MG tablet Take 1 tablet (20 mg total) by mouth 2 (two) times daily. 02/02/18   Geradine Girt, DO  glipiZIDE (GLUCOTROL) 5 MG tablet Take 2.5 mg by mouth daily before breakfast.     [provider]  isosorbide mononitrate (IMDUR) 30 MG 24 hr tablet Take 1 tablet (30 mg total) by mouth daily. 02/28/13   Nahser, Wonda Cheng, MD  latanoprost (XALATAN) 0.005 % ophthalmic solution Place 1 drop into both eyes at bedtime.  06/15/17   [provider]  levothyroxine (SYNTHROID, LEVOTHROID) 50 MCG tablet Take 50 mcg by mouth daily.    [provider]  pantoprazole (PROTONIX) 40 MG tablet Take 1 tablet (40 mg total) by mouth daily. 02/02/18 08/09/18  Geradine Girt, DO  senna-docusate (SENOKOT-S) 8.6-50 MG tablet Take 1 tablet by mouth at bedtime. 12/03/17   Doreatha Lew, MD  simvastatin (ZOCOR) 20 MG tablet Take 20 mg by mouth every evening.    [provider]    Physical Exam: Vitals:   12/16/18 0845 12/16/18 0915 12/16/18 0930  12/16/18 0945  BP: 97/72 (!) 146/63 (!) 167/57 (!) 155/58  Pulse: 61 (!) 56 (!) 59 (!) 56  Resp: (!) 23 17 (!) 24 19  Temp:      TempSrc:      SpO2: 100% 100% 100% 100%    Constitutional: NAD, calm, comfortable Vitals:   12/16/18 0845 12/16/18 0915 12/16/18 0930 12/16/18 0945  BP: 97/72 (!) 146/63 (!) 167/57 (!) 155/58  Pulse: 61 (!) 56 (!) 59 (!) 56  Resp: (!) 23 17 (!) 24 19  Temp:      TempSrc:      SpO2: 100% 100% 100% 100%   Eyes: PERRL, lids and conjunctivae normal ENMT: Mucous membranes are moist. Posterior pharynx clear of any exudate or lesions.Normal dentition.  Neck: normal, supple, no masses, no thyromegaly Respiratory: Patient is in mild respiratory distress currently on BiPAP.  She is able to communicate and finish sentences.  Poor air entry at bases with coarse crackles.  No wheezing.  No accessory muscle use.  Cardiovascular: Regular rate and rhythm, no murmurs / rubs / gallops. No extremity edema. 2+ pedal pulses. No carotid bruits.  Abdomen: no tenderness, no masses palpated. No hepatosplenomegaly. Bowel sounds positive.  Musculoskeletal: no clubbing / cyanosis. No joint deformity upper and lower extremities. Good ROM, no contractures. Normal muscle tone.  Skin: no rashes, lesions, ulcers. No induration Neurologic: CN 2-12 grossly intact. Sensation intact, DTR normal. Strength 5/5 in all 4.  Psychiatric: Normal judgment and insight. Alert and oriented x 3. Normal mood.     Labs on Admission: I have personally reviewed following labs and imaging studies  CBC: Recent Labs  Lab 12/16/18 0829 12/16/18 0837  WBC 8.0  --   NEUTROABS 5.5  --   HGB 9.2* 9.9*  HCT 31.6* 29.0*  MCV 90.8  --   PLT 201  --    Basic Metabolic Panel: Recent Labs  Lab 12/16/18 0829 12/16/18 0837 12/16/18 0839 12/16/18 0857  NA 142 138  --   --   K 4.2 >8.5*  --   --   CL 107  --   --   --   CO2 24  --   --   --   GLUCOSE 179*  --   --   --   BUN 71*  --   --   --     CREATININE 5.39*  --   --  5.60*  CALCIUM 8.8*  --   --   --   MG  --   --  1.8  --    GFR: CrCl cannot be calculated (Unknown ideal weight.). Liver Function Tests: Recent Labs  Lab 12/16/18 0829  AST 26  ALT 20  ALKPHOS 60  BILITOT 0.6  PROT 7.4  ALBUMIN 3.8   No results for input(s): LIPASE, AMYLASE in the last 168 hours. No results for input(s): AMMONIA in the last 168 hours. Coagulation Profile: No results for input(s): INR, PROTIME in the last 168 hours. Cardiac Enzymes: No results for input(s): CKTOTAL, CKMB, CKMBINDEX, TROPONINI in the last 168 hours. BNP (last 3 results) No results for input(s): PROBNP in the last 8760 hours. HbA1C: No results for input(s): HGBA1C in the last 72 hours. CBG: Recent Labs  Lab 12/16/18 0907  GLUCAP 156*   Lipid Profile: No results for input(s): CHOL, HDL, LDLCALC, TRIG, CHOLHDL, LDLDIRECT in the last 72 hours. Thyroid Function Tests: No results for input(s): TSH, T4TOTAL, FREET4, T3FREE, THYROIDAB in the last 72 hours. Anemia Panel: No results for input(s): VITAMINB12, FOLATE, FERRITIN, TIBC, IRON, RETICCTPCT in the last 72 hours. Urine analysis:    Component Value Date/Time   COLORURINE YELLOW 12/01/2017 1437   APPEARANCEUR HAZY (A) 12/01/2017 1437   LABSPEC 1.013 12/01/2017 1437   PHURINE 5.0 12/01/2017 1437   GLUCOSEU NEGATIVE 12/01/2017 1437   HGBUR LARGE (A) 12/01/2017 1437   BILIRUBINUR NEGATIVE 12/01/2017 1437   KETONESUR NEGATIVE 12/01/2017 1437   PROTEINUR 30 (A) 12/01/2017 1437   NITRITE NEGATIVE 12/01/2017 1437   LEUKOCYTESUR TRACE (A) 12/01/2017 1437    Radiological Exams on Admission: Dg Chest Portable 1 View  Result Date: 12/16/2018 CLINICAL DATA:  Respiratory distress. EXAM: PORTABLE CHEST 1 VIEW COMPARISON:  01/31/2018. FINDINGS: Cardiomegaly with diffuse increased bilateral from interstitial prominence. Cardiac B-lines noted. Bilateral pleural effusions. Findings consistent with CHF. Bilateral carotid  vascular calcification. IMPRESSION: 1. Congestive heart failure with bilateral pulmonary interstitial edema bilateral pleural effusions. 2.  Carotid vascular disease. Electronically Signed  By: Aiken   On: 12/16/2018 08:53    EKG: Independently reviewed.  Sinus rhythm.  Right bundle branch block.  No hyperacute T waves.  Prolonged QT of 550.  Assessment/Plan Principal Problem:   Acute respiratory failure (HCC) Active Problems:   Hypertensive heart and chronic kidney disease with heart failure and stage 1 through stage 4 chronic kidney disease, or chronic kidney disease (HCC)   DM2 (diabetes mellitus, type 2) (HCC)   CKD (chronic kidney disease) stage 4, GFR 15-29 ml/min (HCC)   Chronic combined systolic and diastolic CHF (congestive heart failure) (HCC)   Pulmonary hypertension (HCC)   Mitral regurgitation     1.  Acute hypoxic respiratory failure with respiratory distress: Due to acute on chronic pulmonary edema.  Probably due to multifactorial fluid retention due to congestive heart failure, mitral regurgitation as well as progressive kidney failure. Agree with admission to stepdown unit.  Will keep on BiPAP and on supplemental oxygen for respiratory distress and to keep saturations more than 90%.  Patient was given 40 mg of Lasix in the ER.  Nephrology consulted.  Patient may need further diuretics, will defer to nephrology. Known EF of 35%, will recheck echocardiogram to know her current ejection fraction And also to evaluate any pericardial effusion.  2.  Hypertensive chronic kidney disease, stage IV/5: Progressive kidney failure.  Followed by nephrology.  Patient may not be a dialysis candidate.  If further worsening of kidney functions, she will benefit with palliative consult. Daily weight.  Intake and output monitoring.  3.  Hypertension: Started on nitro gtt. in the ER.  Continue.  Resume amlodipine, beta-blockers from home.  4.  Type 2 diabetes: Fairly controlled.   Patient is on oral hypoglycemics at home.  She will continue.  Keep on sliding scale coverage.  5.  Breast cancer: On anastrozole.  She will continue.  She was scheduled for surveillance mammogram today.  She will postpone this.  Discussed with patient about her current clinical situation.  Patient son at bedside.  We discussed about CODE STATUS.  She wants to be DNR/DNI.  Her son at the bedside witnessed and agreed with the mother.  DVT prophylaxis: Heparin subcu. Code Status: DNR/DNI. Family Communication: Son at the bedside. Disposition Plan: Home with home care. Consults called: Nephrology. Admission status: Inpatient to stepdown.   Barb Merino MD [facility] Pager [phone]  If 7PM-7AM, please contact night-coverage [email or web address] Password [email or web address]  12/16/2018, 10:22 AM

## 2018-12-16 NOTE — ED Notes (Signed)
Pt to be placed on bipap, RT at bedside

## 2018-12-16 NOTE — Progress Notes (Signed)
Pt states her breathing is better and wants to trial off bipap. Pt placed on 6 lpm Hand, sat 97-99% no distress noted. Pt denies SOB.  MD at bedside and eval pt now.

## 2018-12-16 NOTE — Code Documentation (Signed)
CODE BLUE NOTE  Patient Name: Mandy Larson   MRN: [id number]   Date of Birth/ Sex: August 24, 1930 , female      Admission Date: 12/16/2018  Attending Provider: Barb Merino, MD  Primary Diagnosis: Acute respiratory failure [facility])    Indication: Pt was in her usual state of health until this PM, when she was noted to be bradycardic and unresponsive. Code blue was subsequently called. At the time of arrival on scene, ACLS protocol was underway.   Patient was given 1mg  Atropine.  This temporarily increased HR to normal range but she then decreased back to 25-30bpm.  Second mg of Atropine was given which had same effect.  Patient was transcutaneously paced while ICU was called for transfer.  Patient then started on 47mcg drip of dopamine before being transported to ICU  ICU provider arrived and took over responsibility   Technical Description:  - CPR performance duration:  No CPR performed.    - Was defibrillation or cardioversion used? No   - Was external pacer placed? Yes  - Was patient intubated pre/post CPR? No    Medications Administered: Y = Yes; Blank = No Amiodarone  N  Atropine  Y  Calcium  N  Epinephrine  N  Lidocaine  N  Magnesium  N  DOPAMINE Y  Norepinephrine  N  Phenylephrine  N  Sodium bicarbonate  N  Vasopressin  N    Post CPR evaluation:  - Final Status - Was patient successfully resuscitated ? Yes - What is current rhythm? Patient was being paced to 60-70 bpm.  - What is current hemodynamic status? Blood pressure normal.  Heart rate controlled with transcutaneous pacing.    Miscellaneous Information:  - Labs sent, including: no  - Primary team notified?  Yes  - Family Notified? Yes  - Additional notes/ transfer status:  patient transferred to ICU 2M10.          Benay Pike, MD  12/16/2018, 8:11 PM

## 2018-12-17 ENCOUNTER — Inpatient Hospital Stay (HOSPITAL_COMMUNITY): Payer: Medicare HMO

## 2018-12-17 DIAGNOSIS — J9600 Acute respiratory failure, unspecified whether with hypoxia or hypercapnia: Secondary | ICD-10-CM

## 2018-12-17 LAB — CBC
HCT: 27.3 % — ABNORMAL LOW (ref 36.0–46.0)
HEMOGLOBIN: 7.9 g/dL — AB (ref 12.0–15.0)
MCH: 26.4 pg (ref 26.0–34.0)
MCHC: 28.9 g/dL — ABNORMAL LOW (ref 30.0–36.0)
MCV: 91.3 fL (ref 80.0–100.0)
Platelets: 155 10*3/uL (ref 150–400)
RBC: 2.99 MIL/uL — AB (ref 3.87–5.11)
RDW: 15.4 % (ref 11.5–15.5)
WBC: 6.4 10*3/uL (ref 4.0–10.5)
nRBC: 0 % (ref 0.0–0.2)

## 2018-12-17 LAB — MAGNESIUM: Magnesium: 1.8 mg/dL (ref 1.7–2.4)

## 2018-12-17 LAB — BLOOD GAS, ARTERIAL
Acid-base deficit: 0.3 mmol/L (ref 0.0–2.0)
Bicarbonate: 25.7 mmol/L (ref 20.0–28.0)
O2 Content: 2 L/min
O2 Saturation: 96.4 %
Patient temperature: 98.6
pCO2 arterial: 56.5 mmHg — ABNORMAL HIGH (ref 32.0–48.0)
pH, Arterial: 7.28 — ABNORMAL LOW (ref 7.350–7.450)
pO2, Arterial: 81.9 mmHg — ABNORMAL LOW (ref 83.0–108.0)

## 2018-12-17 LAB — BASIC METABOLIC PANEL
Anion gap: 16 — ABNORMAL HIGH (ref 5–15)
BUN: 80 mg/dL — ABNORMAL HIGH (ref 8–23)
CO2: 24 mmol/L (ref 22–32)
Calcium: 8.6 mg/dL — ABNORMAL LOW (ref 8.9–10.3)
Chloride: 106 mmol/L (ref 98–111)
Creatinine, Ser: 5.37 mg/dL — ABNORMAL HIGH (ref 0.44–1.00)
GFR calc Af Amer: 8 mL/min — ABNORMAL LOW (ref >60)
GFR calc non Af Amer: 7 mL/min — ABNORMAL LOW (ref >60)
Glucose, Bld: 142 mg/dL — ABNORMAL HIGH (ref 70–99)
Potassium: 4.2 mmol/L (ref 3.5–5.1)
Sodium: 146 mmol/L — ABNORMAL HIGH (ref 135–145)

## 2018-12-17 LAB — GLUCOSE, CAPILLARY
Glucose-Capillary: 114 mg/dL — ABNORMAL HIGH (ref 70–99)
Glucose-Capillary: 120 mg/dL — ABNORMAL HIGH (ref 70–99)
Glucose-Capillary: 148 mg/dL — ABNORMAL HIGH (ref 70–99)
Glucose-Capillary: 148 mg/dL — ABNORMAL HIGH (ref 70–99)

## 2018-12-17 LAB — PHOSPHORUS: Phosphorus: 7 mg/dL — ABNORMAL HIGH (ref 2.5–4.6)

## 2018-12-17 MED ORDER — PHENYLEPHRINE HCL-NACL 10-0.9 MG/250ML-% IV SOLN
50.0000 ug/min | INTRAVENOUS | Status: DC
  Filled 2018-12-17: qty 250

## 2018-12-17 MED ORDER — FUROSEMIDE 10 MG/ML IJ SOLN
80.0000 mg | Freq: Once | INTRAMUSCULAR | Status: AC
  Administered 2018-12-17: 80 mg via INTRAVENOUS
  Filled 2018-12-17: qty 8

## 2018-12-17 MED ORDER — CHLORHEXIDINE GLUCONATE 0.12 % MT SOLN: 15.0000 mL | Freq: Two times a day (BID) | OROMUCOSAL | Status: DC

## 2018-12-17 MED ORDER — ORAL CARE MOUTH RINSE: 15.0000 mL | Freq: Two times a day (BID) | OROMUCOSAL | Status: DC

## 2018-12-17 MED ORDER — ORAL CARE MOUTH RINSE
15.0000 mL | Freq: Two times a day (BID) | OROMUCOSAL | Status: DC
  Administered 2018-12-17 – 2018-12-21 (×7): 15 mL via OROMUCOSAL

## 2018-12-17 MED ORDER — FUROSEMIDE 80 MG PO TABS
80.0000 mg | ORAL_TABLET | Freq: Two times a day (BID) | ORAL | Status: DC
  Administered 2018-12-17 – 2018-12-18 (×2): 80 mg via ORAL
  Filled 2018-12-17: qty 1
  Filled 2018-12-17: qty 2

## 2018-12-17 MED FILL — Medication: Qty: 1 | Status: AC

## 2018-12-17 NOTE — Progress Notes (Addendum)
[facility] Progress Note    Assessment/ Plan:   85 year old AA female who presented for shortness of breath. Had symptomatic bradycardia episode overnight requiring transfer to ICU. Apparent etiology appears to be coreg and cosopt eye drops. Now resolved. Has had only minor urine output with lasix 80mg  x3 per charting and patient subjective report.  1. Shortness of breath 2/2 volume overload Volume overload on cxr and initial lung exam. Now off bipap. s/p IV lasix 80mg  x3. Only 958mL urine charted last 24 hours. Echo showing EF 55-60%. IVC normal in size. Unlikely to be intravascular volume overload given UOP and echo findings. Will transition to oral lasix 80mg  bid. - oral lasix 80mg  bid - monitor uop - pulm toilet  2. Symptomatic bradycardia Etiology felt to be due to coreg, cosopt drops, and nitro gtt. Now asymptomatic. Seems to have resolved. - Management per primary team  3. CKD stage V -Avoid nephrotoxic meds -Continue current management for hypertension -No dialysis indication -Follow-up with normal nephrologist after DC  4. Combined systolic and diastolic heart failure Echo with grade 1 diastolic dysfunction, EF 19-41% - po lasix 80mg  bid - Per primary  5. Hypertension -Amlodipine 10 mg -Carvedilol 3.25 mg twice daily -Status post Lasix 80 mg IV x3 - po lasix 80mg  bid  Subjective:   Feeling better this am, back to how she felt yesterday. No trouble breathing on O2. Had an episode of symptomatic bradycardia o/n requiring xfer to icu and bipap. Has not been urinating frequently per her subjective report.  Currently denies headaches, fevers, chills, chest pain, shortness of breath, nausea, vomiting, diarrhea or dysuria.  Denies any skin rash, arthralgias or myalgias.  All other review of systems are negative.   Objective:   BP 131/77   Pulse 63   Temp 98.1 F (36.7 C) (Oral)   Resp (!) 26   Ht 5\' 3"  (1.6 m)   Wt 62.6 kg   SpO2 97%   BMI 24.45  kg/m   Intake/Output Summary (Last 24 hours) at 12/17/2018 0953 Last data filed at 12/17/2018 0900 Gross per 24 hour  Intake 230.91 ml  Output 925 ml  Net -694.09 ml   Weight change:   Physical Exam: Gen: awake, alert x3. No acute distress, comfortable on nasal cannula CVS: rrr, no m/r/g. Trace pitting edema bilateral lower extremity. Palpable dp bilaterally Resp:bibasilar crackles, no accessory muscle use on  Abd: s/nt/nd. Ext: trace pitting edema, warm/dry.  Skin: No rashes Neuro: Conversant, no focal deficits Psych:  Normal affect  Imaging: Dg Chest Port 1 View  Result Date: 12/17/2018 CLINICAL DATA:  New acute respiratory EXAM: PORTABLE CHEST 1 VIEW COMPARISON:  To 12/16/2018 FINDINGS: Cardiomegaly as seen yesterday. Aortic atherosclerosis. Slight reduction in interstitial edema. Mild edema persists. Mild volume loss at the bases. There may be a small amount of pleural fluid on the left. IMPRESSION: Improved congestive heart failure pattern with less interstitial edema. Electronically Signed   By: Nelson Chimes M.D.   On: 12/17/2018 06:34   Dg Chest Portable 1 View  Result Date: 12/16/2018 CLINICAL DATA:  Respiratory distress. EXAM: PORTABLE CHEST 1 VIEW COMPARISON:  01/31/2018. FINDINGS: Cardiomegaly with diffuse increased bilateral from interstitial prominence. Cardiac B-lines noted. Bilateral pleural effusions. Findings consistent with CHF. Bilateral carotid vascular calcification. IMPRESSION: 1. Congestive heart failure with bilateral pulmonary interstitial edema bilateral pleural effusions. 2.  Carotid vascular disease. Electronically Signed   By: Marcello Moores  Register   On: 12/16/2018 08:53    Labs:  BMET Recent Labs  Lab 12/16/18 0829 12/16/18 0837 12/16/18 0857 12/16/18 1006 12/16/18 2100 12/17/18 0557  NA 142 138  --  146* 144 146*  K 4.2 >8.5*  --  4.3 4.2 4.2  CL 107  --   --  108 109 106  CO2 24  --   --  20* 24 24  GLUCOSE 179*  --   --  149* 135* 142*  BUN 71*  --    --  74* 77* 80*  CREATININE 5.39*  --  5.60* 5.28* 5.30* 5.37*  CALCIUM 8.8*  --   --  9.1 8.2* 8.6*  PHOS  --   --   --  5.9*  --  7.0*   CBC Recent Labs  Lab 12/16/18 0829 12/16/18 0837 12/16/18 2100 12/17/18 0557  WBC 8.0  --  8.2 6.4  NEUTROABS 5.5  --   --   --   HGB 9.2* 9.9* 8.3* 7.9*  HCT 31.6* 29.0* 28.5* 27.3*  MCV 90.8  --  91.6 91.3  PLT 201  --  178 155    Medications:    . anastrozole  1 mg Oral Daily  . aspirin  81 mg Oral Daily  . calcium-vitamin D  1 tablet Oral Q breakfast  . dorzolamide-timolol  1 drop Both Eyes BID  . feeding supplement (GLUCERNA SHAKE)  237 mL Oral BID BM  . heparin  5,000 Units Subcutaneous Q8H  . insulin aspart  0-5 Units Subcutaneous QHS  . insulin aspart  0-9 Units Subcutaneous TID WC  . latanoprost  1 drop Both Eyes QHS  . levothyroxine  50 mcg Oral QAC breakfast  . mouth rinse  15 mL Mouth Rinse BID  . pantoprazole  40 mg Oral Daily  . senna-docusate  1 tablet Oral QHS  . simvastatin  20 mg Oral QPM  . sodium chloride flush  3 mL Intravenous Q12H   Guadalupe Dawn MD PGY-2 Family Medicine Resident    I have personally seen and examined this patient at the bedside with the resident.  I have reviewed the documentation above and made corrections as needed.  Plan was discussed with the patient and the resident at the bedside.  Volume status appears much better.  Doubt she would benefit from increasing diuresis.  Would convert to oral diuretics and monitor.

## 2018-12-17 NOTE — Progress Notes (Signed)
NAME:  Mandy Larson, MRN:  [id number], DOB:  February 29, 1930, LOS: 1 ADMISSION DATE:  12/16/2018, CONSULTATION DATE:  12/16/18 REFERRING MD:  Sloan Leiter  CHIEF COMPLAINT:  Cardiac Arrest   Brief History   Mandy Larson is a 85 y.o. female who was admitted 2/6 with dyspnea felt to be due to acute pulmonary edema.  She was treated with extra lasix per nephrology (underlying hx of CKD IV).  Later that evening, she had a bradycardic arrest.  She received CPR as well as atropine x 2 doses which only helped improve HR temporarily.  She was started on dopamine infusion and was transferred to the ICU.  Past Medical History  CKD IV not felt to be a good candidate for dialysis, HTN, HLD, sCHF (Echo from 12/16/18 with EF 50-55%), DM, anemia, breast CA currently on anastrazole.  Significant Hospital Events   2/6 > admit.  Consults:  Nephrology PCCM  Procedures:  None  Significant Diagnostic Tests:  2/6 > admit, transferred to ICU after bradycardic arrest  Micro Data:    Antimicrobials:  None.   Interim history/subjective:  Mental status improved. No complaints. Wants BiPAP off.   Objective:  Blood pressure 131/77, pulse 63, temperature 98.1 F (36.7 C), temperature source Oral, resp. rate (!) 26, height 5\' 3"  (1.6 m), weight 62.6 kg, SpO2 97 %.    FiO2 (%):  [50 %] 50 %   Intake/Output Summary (Last 24 hours) at 12/17/2018 0919 Last data filed at 12/17/2018 0900 Gross per 24 hour  Intake 230.91 ml  Output 925 ml  Net -694.09 ml   Filed Weights   12/16/18 1613 12/17/18 0500  Weight: 64 kg 62.6 kg    Examination:  General: Frail elderly female in NAD Neuro: Awake, alert, oriented. Non-focal HEENT: [place]/AT. Sclerae anicteric.  NRB on place. Cardiovascular: HR 61. Regular. No MRG. Lungs: Respirations even and unlabored.  Bibasilar crackles.  Abdomen: BS x 4, soft, NT/ND.  Musculoskeletal: No gross deformities, trace edema Skin: Intact, warm, no rashes.  Assessment & Plan:   Bradycardia -  unclear etiology.  Decreased clearance of carvedilol due to CKD.  Treated with glucagon and dopamine infusion. Improved overnight. Now off dopamine, phenylephrine. TSH 2.6. She has also been hypotensive, which is slowly improving but remains borderline.  - Telemetry monitoring.  - Holding home coreg, norvasc, tomolol drops, imdur  Acute hypoxic respiratory failure due to pulmonary edema. - BiPAP PRN, tolerating nasal cannula well currently - Continue diuresis per nephrology - She does not was HD for volume removal  HTN - resolved.  Was temporarily on nitro gtt - Continue to monitor clinically  DM. - Continue SSI, glipizide.  Breast CA. - Continue anastrozole. - F/u with oncology as an outpatient.  Goals of care. - DNR, DNI, Does not want HD.   Will keep in ICU, and if she is able to remain of BiPAP today will transfer back to floor, TR[facility].    Best Practice:  Diet: 2g Sodium restricted. Pain/Anxiety/Delirium protocol (if indicated): N/A. VAP protocol (if indicated): N/A. DVT prophylaxis: SCD's / Heparin. GI prophylaxis: N/A. Glucose control: SSI / glipizide. Mobility: Bedrest. Code Status: Limited. Family Communication: Son and granddaughter updated at bedside. Disposition: ICU.  Labs   CBC: Recent Labs  Lab 12/16/18 0829 12/16/18 0837 12/16/18 2100 12/17/18 0557  WBC 8.0  --  8.2 6.4  NEUTROABS 5.5  --   --   --   HGB 9.2* 9.9* 8.3* 7.9*  HCT 31.6* 29.0* 28.5* 27.3*  MCV  90.8  --  91.6 91.3  PLT 201  --  178 782   Basic Metabolic Panel: Recent Labs  Lab 12/16/18 0829 12/16/18 0837 12/16/18 0839 12/16/18 0857 12/16/18 1006 12/16/18 2100 12/17/18 0557  NA 142 138  --   --  146* 144 146*  K 4.2 >8.5*  --   --  4.3 4.2 4.2  CL 107  --   --   --  108 109 106  CO2 24  --   --   --  20* 24 24  GLUCOSE 179*  --   --   --  149* 135* 142*  BUN 71*  --   --   --  74* 77* 80*  CREATININE 5.39*  --   --  5.60* 5.28* 5.30* 5.37*  CALCIUM 8.8*  --   --   --  9.1  8.2* 8.6*  MG  --   --  1.8  --   --   --  1.8  PHOS  --   --   --   --  5.9*  --  7.0*   GFR: Estimated Creatinine Clearance: 6.2 mL/min (A) (by C-G formula based on SCr of 5.37 mg/dL (H)). Recent Labs  Lab 12/16/18 0829 12/16/18 2100 12/17/18 0557  WBC 8.0 8.2 6.4   Liver Function Tests: Recent Labs  Lab 12/16/18 0829 12/16/18 1006  AST 26  --   ALT 20  --   ALKPHOS 60  --   BILITOT 0.6  --   PROT 7.4  --   ALBUMIN 3.8 3.6   No results for input(s): LIPASE, AMYLASE in the last 168 hours. No results for input(s): AMMONIA in the last 168 hours. ABG    Component Value Date/Time   PHART 7.280 (L) 12/16/2018 1610   PCO2ART 56.5 (H) 12/16/2018 1610   PO2ART 81.9 (L) 12/16/2018 1610   HCO3 25.7 12/16/2018 1610   TCO2 27 12/16/2018 0837   ACIDBASEDEF 0.3 12/16/2018 1610   O2SAT 96.4 12/16/2018 1610    Coagulation Profile: No results for input(s): INR, PROTIME in the last 168 hours. Cardiac Enzymes: Recent Labs  Lab 12/16/18 2100  TROPONINI 0.05*   HbA1C: Hgb A1c MFr Bld  Date/Time Value Ref Range Status  05/27/2018 09:08 AM 6.2 (H) 4.8 - 5.6 % Final    Comment:    (NOTE) Pre diabetes:          5.7%-6.4% Diabetes:              >6.4% Glycemic control for   <7.0% adults with diabetes   07/24/2017 02:36 PM 5.7 (H) 4.8 - 5.6 % Final    Comment:    (NOTE) Pre diabetes:          5.7%-6.4% Diabetes:              >6.4% Glycemic control for   <7.0% adults with diabetes    CBG: Recent Labs  Lab 12/16/18 0907 12/16/18 1756 12/16/18 2005 12/16/18 2228 12/17/18 0746  GLUCAP 156* 148* 113* 112* 114*    Review of Systems:   Unable to obtain as pt is encephalopathic.  Past medical history  She,  has a past medical history of Acute respiratory distress (12/16/2018), Anemia, CAD (coronary artery disease) (06/26/2014), Chronic combined systolic and diastolic CHF (congestive heart failure) ([place]) (11/08/2012), CKD (chronic kidney disease), Diabetes mellitus  without complication ([place]), Former smoker, Heart attack ([place]), Hyperlipidemia, Hypertension, Malignant neoplasm of lower-inner quadrant of right breast of female, estrogen receptor  positive ([place]) (04/21/2018), Pulmonary hypertension ([place]) (11/08/2012), and Severe mitral regurgitation (11/08/2012).   Surgical History    Past Surgical History:  Procedure Laterality Date  . COLONOSCOPY    . ESOPHAGOGASTRODUODENOSCOPY N/A 07/25/2017   Procedure: ESOPHAGOGASTRODUODENOSCOPY (EGD);  Surgeon: [name], MD;  Location: [facility];  Service: Gastroenterology;  Laterality: N/A;  . ESOPHAGOGASTRODUODENOSCOPY N/A 12/02/2017   Procedure: ESOPHAGOGASTRODUODENOSCOPY (EGD);  Surgeon: Ladene Artist, MD;  Location: [facility];  Service: Endoscopy;  Laterality: N/A;  . EYE SURGERY       Social History   reports that she quit smoking about 25 years ago. Her smoking use included cigarettes. She has never used smokeless tobacco. She reports that she does not drink alcohol or use drugs.   Family history   Her family history includes Hypertension in her father.   Allergies No Known Allergies   Home meds  Prior to Admission medications   Medication Sig Start Date End Date Taking? Authorizing Provider  acetaminophen (TYLENOL) 325 MG tablet Take 325 mg by mouth every 6 (six) hours as needed for mild pain or headache.    Yes [provider]  amLODipine (NORVASC) 10 MG tablet Take 10 mg by mouth daily. 01/04/18  Yes [provider]  anastrozole (ARIMIDEX) 1 MG tablet Take 1 tablet (1 mg total) by mouth daily. 06/29/18  Yes Magrinat, Virgie Dad, MD  aspirin 81 MG chewable tablet Chew 81 mg by mouth daily.   Yes [provider]  Calcium Carb-Cholecalciferol (CALTRATE 600+D) 600-800 MG-UNIT TABS Take 1 tablet by mouth daily.   Yes [provider]  carvedilol (COREG) 3.125 MG tablet Take 1 tablet (3.125 mg total) by mouth 2 (two) times daily with a meal. 10/23/16  Yes Nahser,  Wonda Cheng, MD  dorzolamide-timolol (COSOPT) 22.3-6.8 MG/ML ophthalmic solution Place 1 drop into both eyes 2 (two) times daily.  06/20/17  Yes [provider]  feeding supplement, GLUCERNA SHAKE, (GLUCERNA SHAKE) LIQD Take 237 mLs by mouth 2 (two) times daily between meals. 07/26/17  Yes Hosie Poisson, MD  furosemide (LASIX) 20 MG tablet Take 1 tablet (20 mg total) by mouth 2 (two) times daily. 02/02/18  Yes Vann, Jessica U, DO  glipiZIDE (GLUCOTROL) 5 MG tablet Take 2.5 mg by mouth daily before breakfast.    Yes [provider]  isosorbide mononitrate (IMDUR) 30 MG 24 hr tablet Take 1 tablet (30 mg total) by mouth daily. 02/28/13  Yes Nahser, Wonda Cheng, MD  latanoprost (XALATAN) 0.005 % ophthalmic solution Place 1 drop into both eyes at bedtime.  06/15/17  Yes [provider]  levothyroxine (SYNTHROID, LEVOTHROID) 50 MCG tablet Take 50 mcg by mouth daily.   Yes [provider]  pantoprazole (PROTONIX) 40 MG tablet Take 1 tablet (40 mg total) by mouth daily. 02/02/18 12/16/18 Yes Vann, Jessica U, DO  potassium chloride (K-DUR,KLOR-CON) 10 MEQ tablet Take 10 mEq by mouth daily.   Yes [provider]  RESTASIS 0.05 % ophthalmic emulsion Place 1 drop into both eyes every 12 (twelve) hours. 08/30/18  Yes [provider]  senna-docusate (SENOKOT-S) 8.6-50 MG tablet Take 1 tablet by mouth at bedtime. 12/03/17  Yes Patrecia Pour, Christean Grief, MD  simvastatin (ZOCOR) 20 MG tablet Take 20 mg by mouth every evening.   Yes [provider]    Critical care time: 35 min.    Montey Hora, [facility] Pager: [phone].  If no answer, [phone] 12/17/2018, 9:19 AM

## 2018-12-17 NOTE — Progress Notes (Signed)
Report called to Charlett Nose, RN of [facility] staff. Patient and granddaughter aware of transfer. Dentures, upper and lower, sent with patient to [phone]. Patient is alert and oriented. VSS. Two PIV's in LFA are C/D/I and saline locked. No complaints at this time.

## 2018-12-17 NOTE — Progress Notes (Signed)
PCCM Update   85 yr old female with symptomatic bradycardia earlier this evening  Current condition: Pt markedly improved Awake and follow commands verbal Tolerating NIPPV Family at bedside Off of all pressors.  Current Vitals .Marland KitchenBlood pressure (!) 134/54, pulse 68, temperature 98.2 F (36.8 C), temperature source Axillary, resp. rate 19, height 5\' 3"  (1.6 m), weight 64 kg, SpO2 100 %.  UOP 250cc  Discussed with RN Can not take PO meds  Will give her a dose of Lasix - monitor her response Family at bedside and updated  .Marland KitchenSigned Dr Seward [facility] Loc[facility]

## 2018-12-18 DIAGNOSIS — N189 Chronic kidney disease, unspecified: Secondary | ICD-10-CM

## 2018-12-18 DIAGNOSIS — J9602 Acute respiratory failure with hypercapnia: Secondary | ICD-10-CM

## 2018-12-18 DIAGNOSIS — I5043 Acute on chronic combined systolic (congestive) and diastolic (congestive) heart failure: Secondary | ICD-10-CM

## 2018-12-18 LAB — BASIC METABOLIC PANEL
Anion gap: 9 (ref 5–15)
BUN: 83 mg/dL — ABNORMAL HIGH (ref 8–23)
CALCIUM: 8 mg/dL — AB (ref 8.9–10.3)
CO2: 26 mmol/L (ref 22–32)
Chloride: 108 mmol/L (ref 98–111)
Creatinine, Ser: 5.41 mg/dL — ABNORMAL HIGH (ref 0.44–1.00)
GFR calc Af Amer: 8 mL/min — ABNORMAL LOW (ref >60)
GFR calc non Af Amer: 7 mL/min — ABNORMAL LOW (ref >60)
Glucose, Bld: 107 mg/dL — ABNORMAL HIGH (ref 70–99)
Potassium: 4 mmol/L (ref 3.5–5.1)
SODIUM: 143 mmol/L (ref 135–145)

## 2018-12-18 LAB — GLUCOSE, CAPILLARY
Glucose-Capillary: 92 mg/dL (ref 70–99)
Glucose-Capillary: 151 mg/dL — ABNORMAL HIGH (ref 70–99)
Glucose-Capillary: 124 mg/dL — ABNORMAL HIGH (ref 70–99)
Glucose-Capillary: 139 mg/dL — ABNORMAL HIGH (ref 70–99)

## 2018-12-18 LAB — CBC
HCT: 23.8 % — ABNORMAL LOW (ref 36.0–46.0)
Hemoglobin: 7.1 g/dL — ABNORMAL LOW (ref 12.0–15.0)
MCH: 27 pg (ref 26.0–34.0)
MCHC: 29.8 g/dL — ABNORMAL LOW (ref 30.0–36.0)
MCV: 90.5 fL (ref 80.0–100.0)
Platelets: 145 10*3/uL — ABNORMAL LOW (ref 150–400)
RBC: 2.63 MIL/uL — ABNORMAL LOW (ref 3.87–5.11)
RDW: 15.5 % (ref 11.5–15.5)
WBC: 4.8 10*3/uL (ref 4.0–10.5)
nRBC: 0 % (ref 0.0–0.2)

## 2018-12-18 LAB — PHOSPHORUS: Phosphorus: 5.1 mg/dL — ABNORMAL HIGH (ref 2.5–4.6)

## 2018-12-18 LAB — MAGNESIUM: Magnesium: 1.6 mg/dL — ABNORMAL LOW (ref 1.7–2.4)

## 2018-12-18 MED ORDER — BUMETANIDE 2 MG PO TABS
2.0000 mg | ORAL_TABLET | Freq: Two times a day (BID) | ORAL | Status: DC
  Administered 2018-12-18 – 2018-12-21 (×7): 2 mg via ORAL
  Filled 2018-12-18 (×7): qty 1

## 2018-12-18 MED ORDER — POLYETHYLENE GLYCOL 3350 17 G PO PACK
17.0000 g | PACK | Freq: Every day | ORAL | Status: DC
  Administered 2018-12-18 – 2018-12-21 (×4): 17 g via ORAL
  Filled 2018-12-18 (×4): qty 1

## 2018-12-18 MED ORDER — MAGNESIUM OXIDE 400 (241.3 MG) MG PO TABS
400.0000 mg | ORAL_TABLET | Freq: Two times a day (BID) | ORAL | Status: DC
  Administered 2018-12-18 – 2018-12-21 (×6): 400 mg via ORAL
  Filled 2018-12-18 (×6): qty 1

## 2018-12-18 MED ORDER — SENNOSIDES-DOCUSATE SODIUM 8.6-50 MG PO TABS
1.0000 | ORAL_TABLET | Freq: Two times a day (BID) | ORAL | Status: DC
  Administered 2018-12-18 – 2018-12-21 (×7): 1 via ORAL
  Filled 2018-12-18 (×7): qty 1

## 2018-12-18 NOTE — Progress Notes (Signed)
[facility] PROGRESS NOTE  Mandy Larson UJW:[id number] DOB: Jun 11, 1930 DOA: 12/16/2018 PCP: [name], MD  Assessment/Plan: 1. Acute hypoxic respiratory failure.  Currently on 4 L (no longer requiring continuous BiPAP), not on oxygen at home likely secondary to significant pulmonary edema is responding well to Lasix therapy (previously on IV).  Continuing oral Lasix 80 mg twice daily and monitoring urine output.  TTE shows preserved EF.  Wean oxygen 2. Symptomatic bradycardia.  Presumed secondary to home Coreg, eyedrops and nitro drip initiated on day of admission for volume overload.  Currently asymptomatic and heart rate normal.  continue to hold beta-blockers and monitor 3. Hypertensive CKD stage V. Not interested in dialysis, normal output via foley. Monitor BMP.  Nephrology following 4. Combined systolic and diastolic heart failure.  TTE shows preserved EF (55-85% on 12/16/2018) with pseudonormalization of diastolic relaxation. BNP 914. We will continue oral Lasix and monitor daily output, strict I's and O's, fluid restriction. 5. Hypertension, stable.  Continue home amlodipine 6. History of breast cancer continue anastrozole 7. Type 2 diabetes.  Hold home glipizide continue sliding scale, monitor CBG. 8. GERD, stable.  Continue Protonix 9. Hyperlipidemia, stable continue Zocor 10. Hypothyroidism.  TSH within normal limits.  Code Status: Partial code, no CPR, no mechanical ventilation,, no defibrillation or cardioversion if cardiac respiratory arrest.  May use NIPPV/BiPAP in the event of cardiac respiratory arrest Family Communication: Granddaughter updated at bedside Disposition Plan: Needs to continue inpatient admission while weaning oxygen, PT evaluation for disposition, maintain volume status oral Lasix regimen, continue to monitor heart rate   Consultants:  Nephrology     Antibiotics:  None  HPI/Subjective:  Mandy Larson is a 85 y.o. year old female with medical  history significant for chronic combined systolic/diastolic CHF, CKD stage V, HTN, HLD, diabetes, ER positive breast cancer on anastrozole who presented on 12/16/2018 with 2 weeks of progressive dyspnea with minimal exertion  and was found to have acute hypoxic respiratory failure secondary to pulmonary edema.  During hospitalization patient was initially started on BiPAP for respiratory distress (Co2 56.5 on ABG and Ph 7.280)and supplemental oxygen, IV Lasix.  Her course was complicated by decreased heart rate and unresponsiveness due to symptomatic bradycardic episode requiring atropine x2 and transcutaneous pacing with transfer to ICU on 2/6.  Bradycardia presumed secondary to beta-blockade (home Coreg and Cosopt eyedrops (dorzolamide-timolol) and nitro drip started here).  In the ICU she was put on dopamine drip.  Reversal of nitro with phenylephrine infusion and glucagon given for reversal beta-blockade with return to normal heart rate.  [facility] on [facility].  This a.m. granddaughter at bedside. States occasional cough but feels breathing is better. Some mild chest pain at area of previous CPR compressions  Objective: Vitals:   12/18/18 0123 12/18/18 0719  BP:    Pulse: 60 63  Resp: (!) 23 16  Temp:  98.6 F (37 C)  SpO2: 99% 95%    Intake/Output Summary (Last 24 hours) at 12/18/2018 5621 Last data filed at 12/17/2018 2300 Gross per 24 hour  Intake 104.9 ml  Output 825 ml  Net -720.1 ml   Filed Weights   12/16/18 1613 12/17/18 0500 12/18/18 0719  Weight: 64 kg 62.6 kg 66.6 kg    Exam:   General:  Elderly female, no distress in bed  Cardiovascular: SEM loudest in 2nd RICS, RRR, no peripheral edema  Respiratory: some increased respiratory effort on 4L but no accessory muscle use, crackles at mid lungs  Abdomen: soft, normal bowel sounds  Musculoskeletal: normal ROM   Skin no rashes or lesions  Neurologic no appreciable focal deficits, following  commands  Data Reviewed: Basic Metabolic Panel: Recent Labs  Lab 12/16/18 0829 12/16/18 0837 12/16/18 0839 12/16/18 0857 12/16/18 1006 12/16/18 2100 12/17/18 0557 12/18/18 0519  NA 142 138  --   --  146* 144 146* 143  K 4.2 >8.5*  --   --  4.3 4.2 4.2 4.0  CL 107  --   --   --  108 109 106 108  CO2 24  --   --   --  20* 24 24 26   GLUCOSE 179*  --   --   --  149* 135* 142* 107*  BUN 71*  --   --   --  74* 77* 80* 83*  CREATININE 5.39*  --   --  5.60* 5.28* 5.30* 5.37* 5.41*  CALCIUM 8.8*  --   --   --  9.1 8.2* 8.6* 8.0*  MG  --   --  1.8  --   --   --  1.8 1.6*  PHOS  --   --   --   --  5.9*  --  7.0* 5.1*   Liver Function Tests: Recent Labs  Lab 12/16/18 0829 12/16/18 1006  AST 26  --   ALT 20  --   ALKPHOS 60  --   BILITOT 0.6  --   PROT 7.4  --   ALBUMIN 3.8 3.6   No results for input(s): LIPASE, AMYLASE in the last 168 hours. No results for input(s): AMMONIA in the last 168 hours. CBC: Recent Labs  Lab 12/16/18 0829 12/16/18 0837 12/16/18 2100 12/17/18 0557 12/18/18 0519  WBC 8.0  --  8.2 6.4 4.8  NEUTROABS 5.5  --   --   --   --   HGB 9.2* 9.9* 8.3* 7.9* 7.1*  HCT 31.6* 29.0* 28.5* 27.3* 23.8*  MCV 90.8  --  91.6 91.3 90.5  PLT 201  --  178 155 145*   Cardiac Enzymes: Recent Labs  Lab 12/16/18 2100  TROPONINI 0.05*   BNP (last 3 results) Recent Labs    01/31/18 0837 12/16/18 0829  BNP 788.0* 914.0*    ProBNP (last 3 results) No results for input(s): PROBNP in the last 8760 hours.  CBG: Recent Labs  Lab 12/16/18 2228 12/17/18 0746 12/17/18 1130 12/17/18 1542 12/17/18 2024  GLUCAP 112* 114* 120* 148* 148*    Recent Results (from the past 240 hour(s))  MRSA PCR Screening     Status: None   Collection Time: 12/16/18  4:19 PM  Result Value Ref Range Status   MRSA by PCR NEGATIVE NEGATIVE Final    Comment:        The GeneXpert MRSA Assay (FDA approved for NASAL specimens only), is one component of a comprehensive MRSA  colonization surveillance program. It is not intended to diagnose MRSA infection nor to guide or monitor treatment for MRSA infections. Performed at [facility], [place] [address] [place], [place] [address]      Studies: Dg Chest Port 1 View  Result Date: 12/17/2018 CLINICAL DATA:  New acute respiratory EXAM: PORTABLE CHEST 1 VIEW COMPARISON:  To 12/16/2018 FINDINGS: Cardiomegaly as seen yesterday. Aortic atherosclerosis. Slight reduction in interstitial edema. Mild edema persists. Mild volume loss at the bases. There may be a small amount of pleural fluid on the left. IMPRESSION: Improved congestive heart failure pattern with  less interstitial edema. Electronically Signed   By: Nelson Chimes M.D.   On: 12/17/2018 06:34   Dg Chest Portable 1 View  Result Date: 12/16/2018 CLINICAL DATA:  Respiratory distress. EXAM: PORTABLE CHEST 1 VIEW COMPARISON:  01/31/2018. FINDINGS: Cardiomegaly with diffuse increased bilateral from interstitial prominence. Cardiac B-lines noted. Bilateral pleural effusions. Findings consistent with CHF. Bilateral carotid vascular calcification. IMPRESSION: 1. Congestive heart failure with bilateral pulmonary interstitial edema bilateral pleural effusions. 2.  Carotid vascular disease. Electronically Signed   By: Marcello Moores  Register   On: 12/16/2018 08:53    Scheduled Meds: . anastrozole  1 mg Oral Daily  . aspirin  81 mg Oral Daily  . calcium-vitamin D  1 tablet Oral Q breakfast  . feeding supplement (GLUCERNA SHAKE)  237 mL Oral BID BM  . furosemide  80 mg Oral BID  . heparin  5,000 Units Subcutaneous Q8H  . insulin aspart  0-5 Units Subcutaneous QHS  . insulin aspart  0-9 Units Subcutaneous TID WC  . latanoprost  1 drop Both Eyes QHS  . levothyroxine  50 mcg Oral QAC breakfast  . mouth rinse  15 mL Mouth Rinse BID  . pantoprazole  40 mg Oral Daily  . senna-docusate  1 tablet Oral QHS  . simvastatin  20 mg Oral QPM  . sodium chloride flush  3 mL Intravenous  Q12H   Continuous Infusions: . sodium chloride 0 mL/hr at 12/17/18 1900  . phenylephrine (NEO-SYNEPHRINE) Adult infusion Stopped (12/17/18 0604)    Principal Problem:   Acute respiratory failure (HCC) Active Problems:   Hypertensive heart and chronic kidney disease with heart failure and stage 1 through stage 4 chronic kidney disease, or chronic kidney disease (HCC)   DM2 (diabetes mellitus, type 2) (HCC)   CKD (chronic kidney disease) stage 4, GFR 15-29 ml/min (HCC)   Chronic combined systolic and diastolic CHF (congestive heart failure) ([place])   Pulmonary hypertension (HCC)   Mitral regurgitation   Symptomatic sinus bradycardia      Waldron Session   [facility]

## 2018-12-18 NOTE — Evaluation (Signed)
Physical Therapy Evaluation Patient Details Name: Mandy Larson MRN: [id number] DOB: Sep 24, 1930 Today's Date: 12/18/2018   History of Present Illness  Patient is a 85 y/o female who presents with SOB. Had an episode of symptomatic bradycardia, code team called. Admitted with acute respiratory failure with respiratory distress. CXR- increased bilateral vascular prominence and bil effusions. PMH includes mitral regurgitation, HTN, DM, CKD, CAD, anemia.   Clinical Impression  Patient presents with generalized debility, dyspnea on exertion, decreased activity tolerance and impaired mobility s/p above. Tolerated transfers and gait training with close min guard for balance/safety. Sp02 dropped to 86% on RA at rest; donned 1L/min 02 and able to maintain between 88-91% during activity. Pt Mod I PTA, drives, cooks and cares for self. Lives with son but lives in split level home and has stairs to climb to get to bedroom. Highly motivated. Encouraged walking with nursing to bathroom to increase activity level. Will follow acutely to maximize independence and mobility prior to return home.    Follow Up Recommendations Home health PT;Supervision for mobility/OOB    Equipment Recommendations  Rolling walker with 5" wheels    Recommendations for Other Services       Precautions / Restrictions Precautions Precautions: Fall Precaution Comments: watch 02 Restrictions Weight Bearing Restrictions: No      Mobility  Bed Mobility Overal bed mobility: Needs Assistance Bed Mobility: Rolling;Sidelying to Sit Rolling: Min guard Sidelying to sit: Min guard;HOB elevated       General bed mobility comments: Able to get to EOB with assist of rail, no physical assist.   Transfers Overall transfer level: Needs assistance Equipment used: Rolling walker (2 wheeled) Transfers: Sit to/from Stand Sit to Stand: Min guard         General transfer comment: Min guard for safety. Cues for hand placement. Stood  from [employer], from chair x1.   Ambulation/Gait Ambulation/Gait assistance: Min guard Gait Distance (Feet): 20 Feet(+ 15') Assistive device: Rolling walker (2 wheeled) Gait Pattern/deviations: Step-through pattern;Decreased stride length;Trunk flexed Gait velocity: decreased   General Gait Details: Slow, mildly unsteady gait with RW for support; 2-3/4 DOE. Sp02 ranged from 88-92% on 1L/min 02; dropped to 86% on RA at rest. 1 seated rest break due to fatigue, SOB.  Stairs            Wheelchair Mobility    Modified Rankin (Stroke Patients Only)       Balance Overall balance assessment: Mild deficits observed, not formally tested                                           Pertinent Vitals/Pain Pain Assessment: No/denies pain    Home Living Family/patient expects to be discharged to:: Private residence Living Arrangements: Children Available Help at Discharge: Family;Available 24 hours/day Type of Home: House Home Access: Stairs to enter Entrance Stairs-Rails: None Entrance Stairs-Number of Steps: 2 vs none in back Home Layout: Multi-level;Bed/bath upstairs Home Equipment: Cane - single point;Shower seat      Prior Function Level of Independence: Independent with assistive device(s)         Comments: Pt still drives, cooks and cleans, does her own cleaning.  Uses SPC for stairs and going to mailbox. No falls reported.      Hand Dominance   Dominant Hand: Right    Extremity/Trunk Assessment   Upper Extremity Assessment Upper Extremity Assessment: Defer to OT  evaluation    Lower Extremity Assessment Lower Extremity Assessment: Generalized weakness    Cervical / Trunk Assessment Cervical / Trunk Assessment: Kyphotic  Communication   Communication: No difficulties  Cognition Arousal/Alertness: Awake/alert Behavior During Therapy: WFL for tasks assessed/performed Overall Cognitive Status: Within Functional Limits for tasks assessed                                         General Comments General comments (skin integrity, edema, etc.): Granddaughter present during session. BP pre activity 153/62, post activity 152/55    Exercises     Assessment/Plan    PT Assessment Patient needs continued PT services  PT Problem List Decreased strength;Decreased mobility;Decreased activity tolerance;Cardiopulmonary status limiting activity;Decreased balance       PT Treatment Interventions Functional mobility training;Balance training;Patient/family education;Therapeutic activities;Gait training;Therapeutic exercise;Stair training;DME instruction    PT Goals (Current goals can be found in the Care Plan section)  Acute Rehab PT Goals Patient Stated Goal: to get well and go home PT Goal Formulation: With patient Time For Goal Achievement: 01/01/19 Potential to Achieve Goals: Good    Frequency Min 3X/week   Barriers to discharge Inaccessible home environment stairs to get to bedroom    Co-evaluation               AM-PAC PT "6 Clicks" Mobility  Outcome Measure Help needed turning from your back to your side while in a flat bed without using bedrails?: A Little Help needed moving from lying on your back to sitting on the side of a flat bed without using bedrails?: A Little Help needed moving to and from a bed to a chair (including a wheelchair)?: A Little Help needed standing up from a chair using your arms (e.g., wheelchair or bedside chair)?: A Little Help needed to walk in hospital room?: A Little Help needed climbing 3-5 steps with a railing? : [employer] 6 Click Score: 17    End of Session Equipment Utilized During Treatment: Gait belt;Oxygen Activity Tolerance: Treatment limited secondary to medical complications (Comment);Patient limited by fatigue(02 sat dropping) Patient left: in chair;with call bell/phone within reach;with family/visitor present Nurse Communication: Mobility status PT Visit  Diagnosis: Difficulty in walking, not elsewhere classified (R26.2);Muscle weakness (generalized) (M62.81)    Time: 6599-3570 PT Time Calculation (min) (ACUTE ONLY): 27 min   Charges:   PT Evaluation $PT Eval Moderate Complexity: 1 Mod PT Treatments $Gait Training: 8-22 mins        Wray Kearns, PT, DPT Acute Rehabilitation Services Pager [phone] Office [place] 12/18/2018, 12:03 PM

## 2018-12-18 NOTE — Progress Notes (Signed)
Pt resting comfortably and does not want to go on BIPAP at this time. RT will place pt on bipap if needed throughout the night.

## 2018-12-18 NOTE — Progress Notes (Signed)
[facility] Progress Note    Assessment/ Plan:   85 year old AA female who presented for shortness of breath. Had symptomatic bradycardia episode overnight requiring transfer to ICU. Apparent etiology appears to be coreg and cosopt eye drops. Now resolved. Has had only minor urine output with lasix 80mg  x3 per charting and patient subjective report.  1. Shortness of breath 2/2 volume overload Volume overload on cxr and initial lung exam. Now off bipap. s/p IV lasix 80mg  x3. Only 957mL urine charted last 24 hours. Echo showing EF 55-60%. IVC normal in size. Unlikely to be intravascular volume overload given UOP and echo findings.  - monitor uop - pulm toilet -We will change to bumetanide for improved effectiveness.  2. Symptomatic bradycardia Etiology felt to be due to coreg, cosopt drops, and nitro gtt. Now asymptomatic. Seems to have resolved. - Management per primary team  3. CKD stage V -Avoid nephrotoxic meds -Continue current management for hypertension -No dialysis indication -Follow-up with normal nephrologist after DC  4. Combined systolic and diastolic heart failure Echo with grade 1 diastolic dysfunction, EF 22-97% -Changed to bumetanide d - Per primary  5. Hypertension -Amlodipine 10 mg -Carvedilol 3.25 mg twice daily -Status post Lasix 80 mg IV x3 -Bumetanide twice daily  Subjective:   Feeling better this am.  No trouble breathing on O2.  Currently denies headaches, fevers, chills, chest pain, shortness of breath, nausea, vomiting, diarrhea or dysuria.  Denies any skin rash, arthralgias or myalgias.  All other review of systems are negative.   Objective:   BP (!) 161/57 (BP Location: Right Arm)   Pulse 63   Temp 97.8 F (36.6 C) (Oral)   Resp (!) 23   Ht 5\' 3"  (1.6 m)   Wt 66.6 kg   SpO2 96%   BMI 26.01 kg/m   Intake/Output Summary (Last 24 hours) at 12/18/2018 1519 Last data filed at 12/18/2018 1035 Gross per 24 hour  Intake 435.13 ml   Output 400 ml  Net 35.13 ml   Weight change:   Physical Exam: Gen: awake, alert x3. No acute distress, comfortable on nasal cannula CVS: rrr, no m/r/g. Trace pitting edema bilateral lower extremity. Palpable dp bilaterally Resp: Minimal bibasilar crackles, no accessory muscle use on  Abd: s/nt/nd. Ext: No lower extremity edema, warm/dry.  Skin: No rashes Neuro: Conversant, no focal deficits Psych:  Normal affect  Imaging: Dg Chest Port 1 View  Result Date: 12/17/2018 CLINICAL DATA:  New acute respiratory EXAM: PORTABLE CHEST 1 VIEW COMPARISON:  To 12/16/2018 FINDINGS: Cardiomegaly as seen yesterday. Aortic atherosclerosis. Slight reduction in interstitial edema. Mild edema persists. Mild volume loss at the bases. There may be a small amount of pleural fluid on the left. IMPRESSION: Improved congestive heart failure pattern with less interstitial edema. Electronically Signed   By: Nelson Chimes M.D.   On: 12/17/2018 06:34    Labs: BMET Recent Labs  Lab 12/16/18 0829 12/16/18 0837 12/16/18 0857 12/16/18 1006 12/16/18 2100 12/17/18 0557 12/18/18 0519  NA 142 138  --  146* 144 146* 143  K 4.2 >8.5*  --  4.3 4.2 4.2 4.0  CL 107  --   --  108 109 106 108  CO2 24  --   --  20* 24 24 26   GLUCOSE 179*  --   --  149* 135* 142* 107*  BUN 71*  --   --  74* 77* 80* 83*  CREATININE 5.39*  --  5.60* 5.28* 5.30* 5.37*  5.41*  CALCIUM 8.8*  --   --  9.1 8.2* 8.6* 8.0*  PHOS  --   --   --  5.9*  --  7.0* 5.1*   CBC Recent Labs  Lab 12/16/18 0829 12/16/18 0837 12/16/18 2100 12/17/18 0557 12/18/18 0519  WBC 8.0  --  8.2 6.4 4.8  NEUTROABS 5.5  --   --   --   --   HGB 9.2* 9.9* 8.3* 7.9* 7.1*  HCT 31.6* 29.0* 28.5* 27.3* 23.8*  MCV 90.8  --  91.6 91.3 90.5  PLT 201  --  178 155 145*    Medications:    . anastrozole  1 mg Oral Daily  . aspirin  81 mg Oral Daily  . calcium-vitamin D  1 tablet Oral Q breakfast  . feeding supplement (GLUCERNA SHAKE)  237 mL Oral BID BM  .  furosemide  80 mg Oral BID  . heparin  5,000 Units Subcutaneous Q8H  . insulin aspart  0-5 Units Subcutaneous QHS  . insulin aspart  0-9 Units Subcutaneous TID WC  . latanoprost  1 drop Both Eyes QHS  . levothyroxine  50 mcg Oral QAC breakfast  . mouth rinse  15 mL Mouth Rinse BID  . pantoprazole  40 mg Oral Daily  . polyethylene glycol  17 g Oral Daily  . senna-docusate  1 tablet Oral BID  . simvastatin  20 mg Oral QPM  . sodium chloride flush  3 mL Intravenous Q12H

## 2018-12-19 LAB — CBC
HCT: 26.1 % — ABNORMAL LOW (ref 36.0–46.0)
Hemoglobin: 7.5 g/dL — ABNORMAL LOW (ref 12.0–15.0)
MCH: 25.9 pg — ABNORMAL LOW (ref 26.0–34.0)
MCHC: 28.7 g/dL — ABNORMAL LOW (ref 30.0–36.0)
MCV: 90 fL (ref 80.0–100.0)
PLATELETS: 145 10*3/uL — AB (ref 150–400)
RBC: 2.9 MIL/uL — AB (ref 3.87–5.11)
RDW: 15.1 % (ref 11.5–15.5)
WBC: 4.8 10*3/uL (ref 4.0–10.5)
nRBC: 0 % (ref 0.0–0.2)

## 2018-12-19 LAB — BASIC METABOLIC PANEL
Anion gap: 12 (ref 5–15)
BUN: 84 mg/dL — AB (ref 8–23)
CO2: 27 mmol/L (ref 22–32)
Calcium: 8.5 mg/dL — ABNORMAL LOW (ref 8.9–10.3)
Chloride: 105 mmol/L (ref 98–111)
Creatinine, Ser: 5.38 mg/dL — ABNORMAL HIGH (ref 0.44–1.00)
GFR calc Af Amer: 8 mL/min — ABNORMAL LOW (ref >60)
GFR calc non Af Amer: 7 mL/min — ABNORMAL LOW (ref >60)
Glucose, Bld: 112 mg/dL — ABNORMAL HIGH (ref 70–99)
Potassium: 4.8 mmol/L (ref 3.5–5.1)
Sodium: 144 mmol/L (ref 135–145)

## 2018-12-19 LAB — GLUCOSE, CAPILLARY
Glucose-Capillary: 98 mg/dL (ref 70–99)
Glucose-Capillary: 145 mg/dL — ABNORMAL HIGH (ref 70–99)
Glucose-Capillary: 128 mg/dL — ABNORMAL HIGH (ref 70–99)
Glucose-Capillary: 124 mg/dL — ABNORMAL HIGH (ref 70–99)

## 2018-12-19 NOTE — Progress Notes (Signed)
RT went into pt room for 0300 check pt requested to be taken off bipap. RT will continue to monitor as needed.

## 2018-12-19 NOTE — Progress Notes (Signed)
[facility] PROGRESS NOTE  [name]:[id number] DOB: April 12, 1930 DOA: 12/16/2018 PCP: [name], MD  Assessment/Plan: 1. Acute hypoxic respiratory failure, improving.  Currently on 2 L (no longer requiring continuous BiPAP), not on oxygen at home likely secondary to significant pulmonary edema. Nephrology recommends switching from lasix to bumex due to less than 1 L output(charted) in 24 hours on 2/8. Monitor daily weights and I/O.   TTE shows preserved EF.  Wean oxygen 2. Symptomatic bradycardia,resolved.  Presumed secondary to home Coreg, amlodipine eyedrops and nitro drip initiated on day of admission for volume overload.  Currently asymptomatic and heart rate normal.  continue to hold beta-blockers and monitor 3. Hypertensive CKD stage V. Not interested in dialysis, normal output via foley. Monitor BMP.  Nephrology following 4. Combined systolic and diastolic heart failure.  TTE shows preserved EF (55-85% on 12/16/2018) with pseudonormalization of diastolic relaxation. BNP 914. Bumex and monitor daily output, strict I's and O's, fluid restriction. 5. Hypertension, stable.  Holding home BB and amlodipine due to previous bradycardia, IV hydralazine, will discuss with nephrology starting hydralazine 10 mg BID 6. History of breast cancer continue anastrozole 7. Type 2 diabetes.  Hold home glipizide continue sliding scale, monitor CBG. 8. GERD, stable.  Continue Protonix 9. Hyperlipidemia, stable continue Zocor 10. Hypothyroidism.  TSH within normal limits.  Code Status: Partial code, no CPR, no mechanical ventilation,, no defibrillation or cardioversion if cardiac respiratory arrest.  May use NIPPV/BiPAP in the event of cardiac respiratory arrest Family Communication: Granddaughter updated at bedside Disposition Plan: Needs to continue inpatient admission while weaning oxygen, PT evaluation for disposition, maintain volume status oral bumex, BP contro, continue to monitor heart  rate   Consultants:  Nephrology     Antibiotics:  None  HPI/Subjective:  Mandy Larson is a 85 y.o. year old female with medical history significant for chronic combined systolic/diastolic CHF, CKD stage V, HTN, HLD, diabetes, ER positive breast cancer on anastrozole who presented on 12/16/2018 with 2 weeks of progressive dyspnea with minimal exertion  and was found to have acute hypoxic respiratory failure secondary to pulmonary edema.  During hospitalization patient was initially started on BiPAP for respiratory distress (Co2 56.5 on ABG and Ph 7.280)and supplemental oxygen, IV Lasix.  Her course was complicated by decreased heart rate and unresponsiveness due to symptomatic bradycardic episode requiring atropine x2 and transcutaneous pacing with transfer to ICU on 2/6.  Bradycardia presumed secondary to beta-blockade (home Coreg and Cosopt eyedrops (dorzolamide-timolol) and nitro drip started here).  In the ICU she was put on dopamine drip.  Reversal of nitro with phenylephrine infusion and glucagon given for reversal beta-blockade with return to normal heart rate.  She [facility] on 2/8.  This a.m. granddaughter at bedside. Cough improving. Denies CP.   Objective: Vitals:   12/19/18 0430 12/19/18 1440  BP: (!) 161/57 (!) 166/68  Pulse: 76 61  Resp: (!) 21   Temp: 98.4 F (36.9 C) 98 F (36.7 C)  SpO2: 95% 100%    Intake/Output Summary (Last 24 hours) at 12/19/2018 1515 Last data filed at 12/19/2018 0900 Gross per 24 hour  Intake 360 ml  Output 701 ml  Net -341 ml   Filed Weights   12/17/18 0500 12/18/18 0719 12/19/18 0430  Weight: 62.6 kg 66.6 kg 62 kg    Exam:   General:  Elderly female, no distress in bed  Cardiovascular: SEM loudest in 2nd RICS, RRR, no peripheral edema  Respiratory: normal  respiratory effort  on 2L but no accessory muscle use, crackles at mid lungs  Abdomen: soft, normal bowel sounds  Musculoskeletal: normal ROM   Skin  no rashes or lesions  Neurologic no appreciable focal deficits, following commands  Data Reviewed: Basic Metabolic Panel: Recent Labs  Lab 12/16/18 0839  12/16/18 1006 12/16/18 2100 12/17/18 0557 12/18/18 0519 12/19/18 0649  NA  --   --  146* 144 146* 143 144  K  --   --  4.3 4.2 4.2 4.0 4.8  CL  --   --  108 109 106 108 105  CO2  --   --  20* 24 24 26 27   GLUCOSE  --   --  149* 135* 142* 107* 112*  BUN  --   --  74* 77* 80* 83* 84*  CREATININE  --    < > 5.28* 5.30* 5.37* 5.41* 5.38*  CALCIUM  --   --  9.1 8.2* 8.6* 8.0* 8.5*  MG 1.8  --   --   --  1.8 1.6*  --   PHOS  --   --  5.9*  --  7.0* 5.1*  --    < > = values in this interval not displayed.   Liver Function Tests: Recent Labs  Lab 12/16/18 0829 12/16/18 1006  AST 26  --   ALT 20  --   ALKPHOS 60  --   BILITOT 0.6  --   PROT 7.4  --   ALBUMIN 3.8 3.6   No results for input(s): LIPASE, AMYLASE in the last 168 hours. No results for input(s): AMMONIA in the last 168 hours. CBC: Recent Labs  Lab 12/16/18 0829 12/16/18 0837 12/16/18 2100 12/17/18 0557 12/18/18 0519 12/19/18 0649  WBC 8.0  --  8.2 6.4 4.8 4.8  NEUTROABS 5.5  --   --   --   --   --   HGB 9.2* 9.9* 8.3* 7.9* 7.1* 7.5*  HCT 31.6* 29.0* 28.5* 27.3* 23.8* 26.1*  MCV 90.8  --  91.6 91.3 90.5 90.0  PLT 201  --  178 155 145* 145*   Cardiac Enzymes: Recent Labs  Lab 12/16/18 2100  TROPONINI 0.05*   BNP (last 3 results) Recent Labs    01/31/18 0837 12/16/18 0829  BNP 788.0* 914.0*    ProBNP (last 3 results) No results for input(s): PROBNP in the last 8760 hours.  CBG: Recent Labs  Lab 12/18/18 1118 12/18/18 1634 12/18/18 2106 12/19/18 0744 12/19/18 1126  GLUCAP 151* 124* 139* 98 145*    Recent Results (from the past 240 hour(s))  MRSA PCR Screening     Status: None   Collection Time: 12/16/18  4:19 PM  Result Value Ref Range Status   MRSA by PCR NEGATIVE NEGATIVE Final    Comment:        The GeneXpert MRSA Assay  (FDA approved for NASAL specimens only), is one component of a comprehensive MRSA colonization surveillance program. It is not intended to diagnose MRSA infection nor to guide or monitor treatment for MRSA infections. Performed at [facility], [place] [address] [place], [place] [address]      Studies: No results found.  Scheduled Meds: . anastrozole  1 mg Oral Daily  . aspirin  81 mg Oral Daily  . bumetanide  2 mg Oral BID  . calcium-vitamin D  1 tablet Oral Q breakfast  . feeding supplement (GLUCERNA SHAKE)  237 mL Oral BID BM  . heparin  5,000 Units  Subcutaneous Q8H  . insulin aspart  0-5 Units Subcutaneous QHS  . insulin aspart  0-9 Units Subcutaneous TID WC  . latanoprost  1 drop Both Eyes QHS  . levothyroxine  50 mcg Oral QAC breakfast  . magnesium oxide  400 mg Oral BID  . mouth rinse  15 mL Mouth Rinse BID  . pantoprazole  40 mg Oral Daily  . polyethylene glycol  17 g Oral Daily  . senna-docusate  1 tablet Oral BID  . simvastatin  20 mg Oral QPM  . sodium chloride flush  3 mL Intravenous Q12H   Continuous Infusions: . sodium chloride 0 mL/hr at 12/17/18 1900    Principal Problem:   Acute respiratory failure (HCC) Active Problems:   Hypertensive heart and chronic kidney disease with heart failure and stage 1 through stage 4 chronic kidney disease, or chronic kidney disease (HCC)   DM2 (diabetes mellitus, type 2) (HCC)   CKD (chronic kidney disease) stage 4, GFR 15-29 ml/min (HCC)   Chronic combined systolic and diastolic CHF (congestive heart failure) ([place])   Pulmonary hypertension (HCC)   Mitral regurgitation   Symptomatic sinus bradycardia      Waldron Session Leanore Biggers  [facility]

## 2018-12-19 NOTE — Progress Notes (Signed)
[facility] Progress Note    Assessment/ Plan:   85 year old AA female who presented for shortness of breath. Had symptomatic bradycardia episode overnight requiring transfer to ICU. Apparent etiology appears to be coreg and cosopt eye drops. Now resolved.   1. Shortness of breath 2/2 volume overload Volume overload on cxr and initial lung exam. Now off bipap. Echo showing EF 55-60%. IVC normal in size. Unlikely to be intravascular volume overload given UOP and echo findings.  - monitor uop - pulm toilet -We changed to bumetanide for improved effectiveness.  2. Symptomatic bradycardia Etiology felt to be due to coreg, cosopt drops, and nitro gtt. Now asymptomatic. Seems to have resolved. - Management per primary team  3. CKD stage V -Avoid nephrotoxic meds -Continue current management for hypertension -No dialysis indication -Follow-up with normal nephrologist after DC  4. Combined systolic and diastolic heart failure Echo with grade 1 diastolic dysfunction, EF 76-16% -Changed to bumetanide  - Per primary  5. Hypertension -Amlodipine 10 mg -Carvedilol 3.25 mg twice daily -Status post Lasix 80 mg IV x3 -Bumetanide twice daily  Subjective:   Feeling better this am.  No trouble breathing on O2.  Currently denies headaches, fevers, chills, chest pain, shortness of breath, nausea, vomiting, diarrhea or dysuria.  Denies any skin rash, arthralgias or myalgias.  All other review of systems are negative.   Objective:   BP (!) 166/68 (BP Location: Right Arm)   Pulse 61   Temp 98 F (36.7 C) (Oral)   Resp (!) 21   Ht 5\' 3"  (1.6 m)   Wt 62 kg   SpO2 100%   BMI 24.21 kg/m   Intake/Output Summary (Last 24 hours) at 12/19/2018 1625 Last data filed at 12/19/2018 0900 Gross per 24 hour  Intake 360 ml  Output 701 ml  Net -341 ml   Weight change:   Physical Exam: Gen: awake, alert x3. No acute distress, comfortable on nasal cannula CVS: rrr, no m/r/g. Trace  pitting edema bilateral lower extremity. Palpable dp bilaterally Resp: Minimal bibasilar crackles, no accessory muscle use on  Abd: s/nt/nd. Ext: No lower extremity edema, warm/dry.  Skin: No rashes Neuro: Conversant, no focal deficits Psych:  Normal affect  Imaging: No results found.  Labs: BMET Recent Labs  Lab 12/16/18 0829 12/16/18 0737 12/16/18 0857 12/16/18 1006 12/16/18 2100 12/17/18 0557 12/18/18 0519 12/19/18 0649  NA 142 138  --  146* 144 146* 143 144  K 4.2 >8.5*  --  4.3 4.2 4.2 4.0 4.8  CL 107  --   --  108 109 106 108 105  CO2 24  --   --  20* 24 24 26 27   GLUCOSE 179*  --   --  149* 135* 142* 107* 112*  BUN 71*  --   --  74* 77* 80* 83* 84*  CREATININE 5.39*  --  5.60* 5.28* 5.30* 5.37* 5.41* 5.38*  CALCIUM 8.8*  --   --  9.1 8.2* 8.6* 8.0* 8.5*  PHOS  --   --   --  5.9*  --  7.0* 5.1*  --    CBC Recent Labs  Lab 12/16/18 0829  12/16/18 2100 12/17/18 0557 12/18/18 0519 12/19/18 0649  WBC 8.0  --  8.2 6.4 4.8 4.8  NEUTROABS 5.5  --   --   --   --   --   HGB 9.2*   < > 8.3* 7.9* 7.1* 7.5*  HCT 31.6*   < >  28.5* 27.3* 23.8* 26.1*  MCV 90.8  --  91.6 91.3 90.5 90.0  PLT 201  --  178 155 145* 145*   < > = values in this interval not displayed.    Medications:    . anastrozole  1 mg Oral Daily  . aspirin  81 mg Oral Daily  . bumetanide  2 mg Oral BID  . calcium-vitamin D  1 tablet Oral Q breakfast  . feeding supplement (GLUCERNA SHAKE)  237 mL Oral BID BM  . heparin  5,000 Units Subcutaneous Q8H  . insulin aspart  0-5 Units Subcutaneous QHS  . insulin aspart  0-9 Units Subcutaneous TID WC  . latanoprost  1 drop Both Eyes QHS  . levothyroxine  50 mcg Oral QAC breakfast  . magnesium oxide  400 mg Oral BID  . mouth rinse  15 mL Mouth Rinse BID  . pantoprazole  40 mg Oral Daily  . polyethylene glycol  17 g Oral Daily  . senna-docusate  1 tablet Oral BID  . simvastatin  20 mg Oral QPM  . sodium chloride flush  3 mL Intravenous Q12H

## 2018-12-20 LAB — BASIC METABOLIC PANEL
Anion gap: 14 (ref 5–15)
BUN: 81 mg/dL — ABNORMAL HIGH (ref 8–23)
CO2: 26 mmol/L (ref 22–32)
Calcium: 8.9 mg/dL (ref 8.9–10.3)
Chloride: 105 mmol/L (ref 98–111)
Creatinine, Ser: 5.33 mg/dL — ABNORMAL HIGH (ref 0.44–1.00)
GFR calc non Af Amer: 7 mL/min — ABNORMAL LOW (ref >60)
GFR, EST AFRICAN AMERICAN: 8 mL/min — AB (ref >60)
Glucose, Bld: 119 mg/dL — ABNORMAL HIGH (ref 70–99)
POTASSIUM: 4.3 mmol/L (ref 3.5–5.1)
Sodium: 145 mmol/L (ref 135–145)

## 2018-12-20 LAB — GLUCOSE, CAPILLARY
Glucose-Capillary: 105 mg/dL — ABNORMAL HIGH (ref 70–99)
Glucose-Capillary: 201 mg/dL — ABNORMAL HIGH (ref 70–99)
Glucose-Capillary: 107 mg/dL — ABNORMAL HIGH (ref 70–99)
Glucose-Capillary: 133 mg/dL — ABNORMAL HIGH (ref 70–99)

## 2018-12-20 LAB — CBC
HCT: 27.9 % — ABNORMAL LOW (ref 36.0–46.0)
HEMOGLOBIN: 8.3 g/dL — AB (ref 12.0–15.0)
MCH: 26.9 pg (ref 26.0–34.0)
MCHC: 29.7 g/dL — ABNORMAL LOW (ref 30.0–36.0)
MCV: 90.3 fL (ref 80.0–100.0)
Platelets: 177 10*3/uL (ref 150–400)
RBC: 3.09 MIL/uL — ABNORMAL LOW (ref 3.87–5.11)
RDW: 15 % (ref 11.5–15.5)
WBC: 4.6 10*3/uL (ref 4.0–10.5)
nRBC: 0 % (ref 0.0–0.2)

## 2018-12-20 LAB — POCT I-STAT EG7
Acid-base deficit: 3 mmol/L — ABNORMAL HIGH (ref 0.0–2.0)
BICARBONATE: 24.9 mmol/L (ref 20.0–28.0)
Calcium, Ion: 1.02 mmol/L — ABNORMAL LOW (ref 1.15–1.40)
HCT: 29 % — ABNORMAL LOW (ref 36.0–46.0)
Hemoglobin: 9.9 g/dL — ABNORMAL LOW (ref 12.0–15.0)
O2 Saturation: 78 %
PCO2 VEN: 56.8 mmHg (ref 44.0–60.0)
Potassium: >8.5 mmol/L (ref 3.5–5.1)
Sodium: 138 mmol/L (ref 135–145)
TCO2: 27 mmol/L (ref 22–32)
pH, Ven: 7.25 (ref 7.250–7.430)
pO2, Ven: 50 mmHg — ABNORMAL HIGH (ref 32.0–45.0)

## 2018-12-20 MED ORDER — HYDRALAZINE HCL 20 MG/ML IJ SOLN: 5.0000 mg | Freq: Four times a day (QID) | INTRAMUSCULAR | Status: DC | PRN

## 2018-12-20 MED ORDER — HYDRALAZINE HCL 10 MG PO TABS
10.0000 mg | ORAL_TABLET | Freq: Three times a day (TID) | ORAL | Status: DC
  Administered 2018-12-20 – 2018-12-21 (×3): 10 mg via ORAL
  Filled 2018-12-20 (×3): qty 1

## 2018-12-20 NOTE — Progress Notes (Signed)
SATURATION QUALIFICATIONS: (This note is used to comply with regulatory documentation for home oxygen)  Patient Saturations on Room Air at Rest = 94%  Patient Saturations on Room Air while Ambulating = 87%  Patient Saturations on 1 Liter of oxygen while Ambulating = 93%  Please briefly explain why patient needs home oxygen: Pt's O2 dropped to 87% while ambulating without O2 as well as noticeable increased work of breathing while ambulating.  [place], OTR/L Acute Rehab Services Pager [phone] Office [phone]

## 2018-12-20 NOTE — Progress Notes (Signed)
[facility] Progress Note    Assessment/ Plan:   85 year old AA female who presented for shortness of breath. Had symptomatic bradycardia episode overnight requiring transfer to ICU. Apparent etiology appears to be coreg and cosopt eye drops. Now resolved.   1. Shortness of breath 2/2 volume overload Volume overload on cxr and initial lung exam. Now off bipap. Echo showing EF 55-60%. IVC normal in size. Unlikely to be intravascular volume overload given UOP and echo findings.  - monitor uop - pulm toilet -We changed to bumetanide for improved effectiveness. -Looks euvolemic today.  2. Symptomatic bradycardia Etiology felt to be due to coreg, cosopt drops, and nitro gtt. Now asymptomatic. Seems to have resolved. - Management per primary team  3. CKD stage V -Avoid nephrotoxic meds -Continue current management for hypertension -No dialysis indication -Follow-up with usual nephrologist after DC  4. Combined systolic and diastolic heart failure Echo with grade 1 diastolic dysfunction, EF 96-22% -Changed to bumetanide  - Per primary  5. Hypertension -Amlodipine 10 mg -Off carvedilol with bradycardia.  Okay with hydralazine. -Bumetanide twice daily  Subjective:   Feeling better this am.  No trouble breathing on O2.  Currently denies headaches, fevers, chills, chest pain, shortness of breath, nausea, vomiting, diarrhea or dysuria.  Denies any skin rash, arthralgias or myalgias.  All other review of systems are negative.   Objective:   BP 129/76 (BP Location: Right Arm)   Pulse 64   Temp 97.8 F (36.6 C) (Oral)   Resp 20   Ht 5\' 3"  (1.6 m)   Wt 60.6 kg   SpO2 98%   BMI 23.68 kg/m   Intake/Output Summary (Last 24 hours) at 12/20/2018 1632 Last data filed at 12/20/2018 1100 Gross per 24 hour  Intake 342 ml  Output 1350 ml  Net -1008 ml   Weight change: -5.954 kg  Physical Exam: Gen: awake, alert x3. No acute distress, comfortable on nasal  cannula CVS: rrr, no m/r/g. Trace pitting edema bilateral lower extremity. Palpable dp bilaterally Resp: Minimal bibasilar crackles, no accessory muscle use on  Abd: s/nt/nd. Ext: No lower extremity edema, warm/dry.  Skin: No rashes Neuro: Conversant, no focal deficits Psych:  Normal affect  Imaging: No results found.  Labs: BMET Recent Labs  Lab 12/16/18 0829 12/16/18 2979 12/16/18 0857 12/16/18 1006 12/16/18 2100 12/17/18 0557 12/18/18 0519 12/19/18 0649 12/20/18 0753  NA 142 138  --  146* 144 146* 143 144 145  K 4.2 >8.5*  --  4.3 4.2 4.2 4.0 4.8 4.3  CL 107  --   --  108 109 106 108 105 105  CO2 24  --   --  20* 24 24 26 27 26   GLUCOSE 179*  --   --  149* 135* 142* 107* 112* 119*  BUN 71*  --   --  74* 77* 80* 83* 84* 81*  CREATININE 5.39*  --  5.60* 5.28* 5.30* 5.37* 5.41* 5.38* 5.33*  CALCIUM 8.8*  --   --  9.1 8.2* 8.6* 8.0* 8.5* 8.9  PHOS  --   --   --  5.9*  --  7.0* 5.1*  --   --    CBC Recent Labs  Lab 12/16/18 0829  12/17/18 0557 12/18/18 0519 12/19/18 0649 12/20/18 0753  WBC 8.0   < > 6.4 4.8 4.8 4.6  NEUTROABS 5.5  --   --   --   --   --   HGB 9.2*   < >  7.9* 7.1* 7.5* 8.3*  HCT 31.6*   < > 27.3* 23.8* 26.1* 27.9*  MCV 90.8   < > 91.3 90.5 90.0 90.3  PLT 201   < > 155 145* 145* 177   < > = values in this interval not displayed.    Medications:    . anastrozole  1 mg Oral Daily  . aspirin  81 mg Oral Daily  . bumetanide  2 mg Oral BID  . calcium-vitamin D  1 tablet Oral Q breakfast  . feeding supplement (GLUCERNA SHAKE)  237 mL Oral BID BM  . heparin  5,000 Units Subcutaneous Q8H  . hydrALAZINE  10 mg Oral Q8H  . insulin aspart  0-5 Units Subcutaneous QHS  . insulin aspart  0-9 Units Subcutaneous TID WC  . latanoprost  1 drop Both Eyes QHS  . levothyroxine  50 mcg Oral QAC breakfast  . magnesium oxide  400 mg Oral BID  . mouth rinse  15 mL Mouth Rinse BID  . pantoprazole  40 mg Oral Daily  . polyethylene glycol  17 g Oral Daily  .  senna-docusate  1 tablet Oral BID  . simvastatin  20 mg Oral QPM  . sodium chloride flush  3 mL Intravenous Q12H

## 2018-12-20 NOTE — Evaluation (Signed)
Occupational Therapy Evaluation Patient Details Name: Mandy Larson MRN: [id number] DOB: 02-06-1930 Today's Date: 12/20/2018    History of Present Illness Patient is a 85 y/o female who presents with SOB. Had an episode of symptomatic bradycardia, code team called. Admitted with acute respiratory failure with respiratory distress. CXR- increased bilateral vascular prominence and bil effusions. PMH includes mitral regurgitation, HTN, DM, CKD, CAD, anemia.    Clinical Impression   This 85 yo female admitted with above presents to acute OT at a min guard A level for basic ADLs with drop in O2 sats and increased work of breathing when not on supplemental O2. She will benefit from acute OT with follow up Elaine to get back to her PLOF of Mod I-Independent.  Pt's O2 sats on 2 liters upon entering room 97%                       on RA at rest 94%                       On RA with ambulation 87%                       On 1 liter with ambulation 92%    Follow Up Recommendations  Home health OT;Supervision/Assistance - 24 hour    Equipment Recommendations  None recommended by OT       Precautions / Restrictions Precautions Precautions: Fall Precaution Comments: watch 02 (dropped to 87% on RA today with ambulation) Restrictions Weight Bearing Restrictions: No      Mobility Bed Mobility Overal bed mobility: Needs Assistance Bed Mobility: Supine to Sit   Sidelying to sit: Supervision          Transfers Overall transfer level: Needs assistance Equipment used: Rolling walker (2 wheeled) Transfers: Sit to/from Stand Sit to Stand: Min guard         General transfer comment: Min guard for safety. Cues for hand placement.     Balance Overall balance assessment: Mild deficits observed, not formally tested                                         ADL either performed or assessed with clinical judgement   ADL Overall ADL's : Needs assistance/impaired Eating/Feeding:  Independent;Sitting   Grooming: Min guard;Standing   Upper Body Bathing: Set up;Sitting   Lower Body Bathing: Min guard;Sit to/from stand   Upper Body Dressing : Set up;Sitting   Lower Body Dressing: Min guard;Sit to/from stand   Toilet Transfer: Min guard;Ambulation;RW Toilet Transfer Details (indicate cue type and reason): Bed>out door and down hall, back to room (50 feet)>sit in recliner Toileting- Clothing Manipulation and Hygiene: Min guard;Sit to/from stand         General ADL Comments: Started education on purse lipped breathing. Educated on use of inspirometer with pt doing it 10 times while I was in room and verbalized understanding she is to do it 10 times every hour she is awake.     Vision Patient Visual Report: No change from baseline              Pertinent Vitals/Pain Pain Assessment: No/denies pain     Hand Dominance Right   Extremity/Trunk Assessment Upper Extremity Assessment Upper Extremity Assessment: Overall WFL for tasks assessed  Communication Communication Communication: No difficulties   Cognition Arousal/Alertness: Awake/alert Behavior During Therapy: WFL for tasks assessed/performed Overall Cognitive Status: Within Functional Limits for tasks assessed                                                Home Living Family/patient expects to be discharged to:: Private residence Living Arrangements: Children Available Help at Discharge: Family;Available 24 hours/day Type of Home: House Home Access: Stairs to enter [employer] of Steps: 2 vs none in back   Home Layout: Multi-level;Bed/bath upstairs Alternate Level Stairs-Number of Steps: flight Alternate Level Stairs-Rails: Right Bathroom Shower/Tub: Tub/shower unit;Curtain   Bathroom Toilet: Standard     Home Equipment: Cane - single point;Shower seat;Bedside commode;Walker - 2 wheels;Walker - 4 wheels   Additional Comments: Does not use  shower seat--"too unstable"      Prior Functioning/Environment Level of Independence: Independent with assistive device(s)        Comments: Pt still drives, cooks and cleans, does her own cleaning.  Uses SPC for stairs and going to mailbox. No falls reported.         OT Problem List: Impaired balance (sitting and/or standing);Decreased activity tolerance;Cardiopulmonary status limiting activity      OT Treatment/Interventions: Self-care/ADL training;Balance training;Energy conservation;DME and/or AE instruction;Patient/family education    OT Goals(Current goals can be found in the care plan section) Acute Rehab OT Goals Patient Stated Goal: to go home OT Goal Formulation: With patient Time For Goal Achievement: 01/03/19 Potential to Achieve Goals: Good  OT Frequency: Min 2X/week              AM-PAC OT "6 Clicks" Daily Activity     Outcome Measure Help from another person eating meals?: None Help from another person taking care of personal grooming?: A Little Help from another person toileting, which includes using toliet, bedpan, or urinal?: A Little Help from another person bathing (including washing, rinsing, drying)?: A Little Help from another person to put on and taking off regular upper body clothing?: A Little Help from another person to put on and taking off regular lower body clothing?: A Little 6 Click Score: 19   End of Session Equipment Utilized During Treatment: Gait belt;Rolling walker;Oxygen(RA and 1 liter) Nurse Communication: Mobility status(drop in O2 sats, left pt on 1 liter of O2)  Activity Tolerance: Other (comment)(pt needed supplemental O2 to keep sats above 88%) Patient left: in chair;with call bell/phone within reach;with family/visitor present  OT Visit Diagnosis: Unsteadiness on feet (R26.81);Other abnormalities of gait and mobility (R26.89);Muscle weakness (generalized) (M62.81)                Time: 9798-9211 OT Time Calculation (min): 39  min Charges:  OT General Charges $OT Visit: 1 Visit OT Evaluation $OT Eval Moderate Complexity: 1 Mod OT Treatments $Self Care/Home Management : 23-37 mins  Golden Circle, OTR/L Acute [employer] Pager [phone] Office [phone]     Almon Register 12/20/2018, 10:26 AM

## 2018-12-20 NOTE — Care Management Important Message (Signed)
Important Message  Patient Details  Name: Mandy Larson MRN: [id number] Date of Birth: [birth year]   Medicare Important Message Given:  Yes    [name] [name] 12/20/2018, 3:01 PM

## 2018-12-20 NOTE — Progress Notes (Signed)
[facility] PROGRESS NOTE  Mandy Larson JKD:[id number] DOB: 10/06/1930 DOA: 12/16/2018 PCP: [name], MD  Assessment/Plan: 1. Acute hypoxic respiratory failure, improving.  Currently on 2 L (no longer requiring continuous BiPAP), not on oxygen at home likely secondary to significant pulmonary edema.  Had good urinary output on Bumex dosing, hopefully can wean off oxygen to room air (.  Test was able to go down to 1 L on nasal cannula today) will reassess in next 24 hours   TTE shows preserved EF.  Wean oxygen 2. Symptomatic bradycardia,resolved.  Presumed secondary to home Coreg, amlodipine eyedrops and nitro drip initiated on day of admission for volume overload.  Currently asymptomatic and heart rate normal.  continue to hold beta-blockers and monitor 3. Hypertensive CKD stage V. Not interested in dialysis, normal output via foley. Monitor BMP.  Nephrology following 4. Combined systolic and diastolic heart failure.  TTE shows preserved EF (55-85% on 12/16/2018) with pseudonormalization of diastolic relaxation. BNP 914. Bumex and monitor daily output, strict I's and O's, fluid restriction. 5. Hypertension, stable.  Holding home BB and amlodipine due to previous bradycardia, IV hydralazine, starting hydralazine 10 mg BID 6. History of breast cancer continue anastrozole 7. Type 2 diabetes.  Hold home glipizide continue sliding scale, monitor CBG. 8. GERD, stable.  Continue Protonix 9. Hyperlipidemia, stable continue Zocor 10. Hypothyroidism.  TSH within normal limits.  Code Status: Partial code, no CPR, no mechanical ventilation,, no defibrillation or cardioversion if cardiac respiratory arrest.  May use NIPPV/BiPAP in the event of cardiac respiratory arrest Family Communication: Granddaughter updated at bedside Disposition Plan: Needs to continue inpatient admission while weaning oxygen, PT/OT recommend home health PT and  OT   Consultants:  Nephrology     Antibiotics:  None  HPI/Subjective:  Mandy Larson is a 85 y.o. year old female with medical history significant for chronic combined systolic/diastolic CHF, CKD stage V, HTN, HLD, diabetes, ER positive breast cancer on anastrozole who presented on 12/16/2018 with 2 weeks of progressive dyspnea with minimal exertion  and was found to have acute hypoxic respiratory failure secondary to pulmonary edema.  During hospitalization patient was initially started on BiPAP for respiratory distress (Co2 56.5 on ABG and Ph 7.280)and supplemental oxygen, IV Lasix.  Her course was complicated by decreased heart rate and unresponsiveness due to symptomatic bradycardic episode requiring atropine x2 and transcutaneous pacing with transfer to ICU on 2/6.  Bradycardia presumed secondary to beta-blockade (home Coreg and Cosopt eyedrops (dorzolamide-timolol) and nitro drip started here).  In the ICU she was put on dopamine drip.  Reversal of nitro with phenylephrine infusion and glucagon given for reversal beta-blockade with return to normal heart rate.  She [facility] on 2/8.  This a.m. granddaughter at bedside.  Continues to report improvement in cough, only minimal shortness of breath.  Having normal bowel movements.  Objective: Vitals:   12/20/18 1200 12/20/18 1700  BP: 129/76 (!) 186/76  Pulse: 64   Resp: 20   Temp: 97.8 F (36.6 C)   SpO2: 98%     Intake/Output Summary (Last 24 hours) at 12/20/2018 1900 Last data filed at 12/20/2018 1700 Gross per 24 hour  Intake 924 ml  Output 1650 ml  Net -726 ml   Filed Weights   12/18/18 0719 12/19/18 0430 12/20/18 0600  Weight: 66.6 kg 62 kg 60.6 kg    Exam:   General:  Elderly female, no distress in bed  Cardiovascular: SEM loudest in 2nd RICS, RRR, no peripheral  edema  Respiratory: normal  respiratory effort on 2L but no accessory muscle use, crackles at bases  Abdomen: soft, normal bowel  sounds  Musculoskeletal: normal ROM   Skin no rashes or lesions  Neurologic no appreciable focal deficits, following commands  Data Reviewed: Basic Metabolic Panel: Recent Labs  Lab 12/16/18 0839  12/16/18 1006 12/16/18 2100 12/17/18 0557 12/18/18 0519 12/19/18 0649 12/20/18 0753  NA  --   --  146* 144 146* 143 144 145  K  --   --  4.3 4.2 4.2 4.0 4.8 4.3  CL  --   --  108 109 106 108 105 105  CO2  --   --  20* 24 24 26 27 26   GLUCOSE  --   --  149* 135* 142* 107* 112* 119*  BUN  --   --  74* 77* 80* 83* 84* 81*  CREATININE  --    < > 5.28* 5.30* 5.37* 5.41* 5.38* 5.33*  CALCIUM  --   --  9.1 8.2* 8.6* 8.0* 8.5* 8.9  MG 1.8  --   --   --  1.8 1.6*  --   --   PHOS  --   --  5.9*  --  7.0* 5.1*  --   --    < > = values in this interval not displayed.   Liver Function Tests: Recent Labs  Lab 12/16/18 0829 12/16/18 1006  AST 26  --   ALT 20  --   ALKPHOS 60  --   BILITOT 0.6  --   PROT 7.4  --   ALBUMIN 3.8 3.6   No results for input(s): LIPASE, AMYLASE in the last 168 hours. No results for input(s): AMMONIA in the last 168 hours. CBC: Recent Labs  Lab 12/16/18 0829  12/16/18 2100 12/17/18 0557 12/18/18 0519 12/19/18 0649 12/20/18 0753  WBC 8.0  --  8.2 6.4 4.8 4.8 4.6  NEUTROABS 5.5  --   --   --   --   --   --   HGB 9.2*   < > 8.3* 7.9* 7.1* 7.5* 8.3*  HCT 31.6*   < > 28.5* 27.3* 23.8* 26.1* 27.9*  MCV 90.8  --  91.6 91.3 90.5 90.0 90.3  PLT 201  --  178 155 145* 145* 177   < > = values in this interval not displayed.   Cardiac Enzymes: Recent Labs  Lab 12/16/18 2100  TROPONINI 0.05*   BNP (last 3 results) Recent Labs    01/31/18 0837 12/16/18 0829  BNP 788.0* 914.0*    ProBNP (last 3 results) No results for input(s): PROBNP in the last 8760 hours.  CBG: Recent Labs  Lab 12/19/18 1634 12/19/18 2134 12/20/18 0745 12/20/18 1136 12/20/18 1707  GLUCAP 128* 124* 105* 201* 107*    Recent Results (from the past 240 hour(s))  MRSA PCR  Screening     Status: None   Collection Time: 12/16/18  4:19 PM  Result Value Ref Range Status   MRSA by PCR NEGATIVE NEGATIVE Final    Comment:        The GeneXpert MRSA Assay (FDA approved for NASAL specimens only), is one component of a comprehensive MRSA colonization surveillance program. It is not intended to diagnose MRSA infection nor to guide or monitor treatment for MRSA infections. Performed at [facility], [place] [address], [place], [place] [address]      Studies: No results found.  Scheduled Meds: . anastrozole  1 mg Oral Daily  . aspirin  81 mg Oral Daily  . bumetanide  2 mg Oral BID  . calcium-vitamin D  1 tablet Oral Q breakfast  . feeding supplement (GLUCERNA SHAKE)  237 mL Oral BID BM  . heparin  5,000 Units Subcutaneous Q8H  . hydrALAZINE  10 mg Oral Q8H  . insulin aspart  0-5 Units Subcutaneous QHS  . insulin aspart  0-9 Units Subcutaneous TID WC  . latanoprost  1 drop Both Eyes QHS  . levothyroxine  50 mcg Oral QAC breakfast  . magnesium oxide  400 mg Oral BID  . mouth rinse  15 mL Mouth Rinse BID  . pantoprazole  40 mg Oral Daily  . polyethylene glycol  17 g Oral Daily  . senna-docusate  1 tablet Oral BID  . simvastatin  20 mg Oral QPM  . sodium chloride flush  3 mL Intravenous Q12H   Continuous Infusions: . sodium chloride 0 mL/hr at 12/17/18 1900    Principal Problem:   Acute respiratory failure (HCC) Active Problems:   Hypertensive heart and chronic kidney disease with heart failure and stage 1 through stage 4 chronic kidney disease, or chronic kidney disease (HCC)   DM2 (diabetes mellitus, type 2) (HCC)   CKD (chronic kidney disease) stage 4, GFR 15-29 ml/min (HCC)   Chronic combined systolic and diastolic CHF (congestive heart failure) ([place])   Pulmonary hypertension (HCC)   Mitral regurgitation   Symptomatic sinus bradycardia      Waldron Session Kensley Lares  [facility]

## 2018-12-21 LAB — GLUCOSE, CAPILLARY
Glucose-Capillary: 123 mg/dL — ABNORMAL HIGH (ref 70–99)
Glucose-Capillary: 167 mg/dL — ABNORMAL HIGH (ref 70–99)
Glucose-Capillary: 129 mg/dL — ABNORMAL HIGH (ref 70–99)

## 2018-12-21 MED ORDER — POLYETHYLENE GLYCOL 3350 17 G PO PACK: 17.0000 g | PACK | Freq: Every day | ORAL | 0 refills | Status: AC | PRN

## 2018-12-21 MED ORDER — MAGNESIUM OXIDE 400 (241.3 MG) MG PO TABS: 400.0000 mg | ORAL_TABLET | Freq: Two times a day (BID) | ORAL | 0 refills | Status: AC

## 2018-12-21 MED ORDER — HYDRALAZINE HCL 25 MG PO TABS: 25.0000 mg | ORAL_TABLET | Freq: Three times a day (TID) | ORAL | 0 refills | Status: AC

## 2018-12-21 MED ORDER — HYDRALAZINE HCL 25 MG PO TABS
25.0000 mg | ORAL_TABLET | Freq: Three times a day (TID) | ORAL | Status: DC
  Administered 2018-12-21: 25 mg via ORAL
  Filled 2018-12-21: qty 1

## 2018-12-21 MED ORDER — BUMETANIDE 2 MG PO TABS: 2.0000 mg | ORAL_TABLET | Freq: Two times a day (BID) | ORAL | 0 refills | Status: AC

## 2018-12-21 MED ORDER — AMLODIPINE BESYLATE 10 MG PO TABS: ORAL_TABLET | ORAL | 4 refills | Status: AC

## 2018-12-21 NOTE — Progress Notes (Signed)
Physical Therapy Treatment Patient Details Name: Mandy Larson MRN: [id number] DOB: September 08, 1930 Today's Date: 12/21/2018    History of Present Illness Pt is a 85 y/o female who presents 12/16/18 with SOB. Had an episode of symptomatic bradycardia, code team called. Admitted with acute respiratory failure with respiratory distress. CXR- increased bilateral vascular prominence and bil effusions. PMH includes mitral regurgitation, HTN, DM, CKD, CAD, anemia.    PT Comments    Pt planning to d/c home this afternoon, seen for additional session for stair training. Pt requires close min guard to ascend/descend steps safely with BUE support on rails, increased time and effort. Pt continues to decline SNF-level therapies although reports son not able to provide physical assist on steps. Reports she has additional family who can support upon initial return home. Strongly encourage use of RW for all amb and assist on steps; pt in agreement. If to remain admitted, will follow acutely.   Follow Up Recommendations  Home health PT;Supervision for mobility/OOB     Equipment Recommendations  Rolling walker with 5" wheels    Recommendations for Other Services       Precautions / Restrictions Precautions Precautions: Fall Restrictions Weight Bearing Restrictions: No    Mobility  Bed Mobility Overal bed mobility: Modified Independent Bed Mobility: Supine to Sit     Supine to sit: Modified independent (Device/Increase time);HOB elevated        Transfers Overall transfer level: Needs assistance Equipment used: Rolling walker (2 wheeled) Transfers: Sit to/from Stand Sit to Stand: Min guard         General transfer comment: Reliant on momentum to power into standing; cues for correct hand placement. Stood from bed and recliner to RW, min guard for balance  Ambulation/Gait Ambulation/Gait assistance: Supervision Gait Distance (Feet): 100 Feet Assistive device: Rolling walker (2 wheeled) Gait  Pattern/deviations: Step-through pattern;Decreased stride length;Trunk flexed Gait velocity: Decreased Gait velocity interpretation: <1.31 ft/sec, indicative of household ambulator General Gait Details: Slow, steady gait with RW and supervision for safety; intermittent cues to maintain closer proximity to RW and upright posture. Able to minimally increase gait speed with cues   Stairs Stairs: Yes Stairs assistance: Min guard Stair Management: One rail Left;Step to pattern;Sideways;Forwards;With cane   General stair comments: Initially attempted stair training with LUE support on rail and RUE support on cane, close min guard and significant effort; technique improved with BUE support on L-side rail, ascending with min guard. Able to descend with BUE support on same rail; close min guard for balance   Wheelchair Mobility    Modified Rankin (Stroke Patients Only)       Balance Overall balance assessment: Needs assistance   Sitting balance-Leahy Scale: Good       Standing balance-Leahy Scale: Poor Standing balance comment: Reliant on UE support                            Cognition Arousal/Alertness: Awake/alert Behavior During Therapy: WFL for tasks assessed/performed Overall Cognitive Status: Within Functional Limits for tasks assessed                                        Exercises      General Comments General comments (skin integrity, edema, etc.): Granddaughter present and supportive      Pertinent Vitals/Pain Pain Assessment: No/denies pain    Home Living  Prior Function            PT Goals (current goals can now be found in the care plan section) Acute Rehab PT Goals Patient Stated Goal: to go home PT Goal Formulation: With patient Time For Goal Achievement: 01/01/19 Potential to Achieve Goals: Good Progress towards PT goals: Progressing toward goals    Frequency    Min 3X/week      PT  Plan Current plan remains appropriate    Co-evaluation              AM-PAC PT "6 Clicks" Mobility   Outcome Measure  Help needed turning from your back to your side while in a flat bed without using bedrails?: None Help needed moving from lying on your back to sitting on the side of a flat bed without using bedrails?: None Help needed moving to and from a bed to a chair (including a wheelchair)?: A Little Help needed standing up from a chair using your arms (e.g., wheelchair or bedside chair)?: A Little Help needed to walk in hospital room?: A Little Help needed climbing 3-5 steps with a railing? : A Little 6 Click Score: 20    End of Session Equipment Utilized During Treatment: Gait belt Activity Tolerance: Patient tolerated treatment well Patient left: in chair;with call bell/phone within reach Nurse Communication: Mobility status PT Visit Diagnosis: Difficulty in walking, not elsewhere classified (R26.2);Muscle weakness (generalized) (M62.81)     Time: 1157-2620 PT Time Calculation (min) (ACUTE ONLY): 18 min  Charges:  $Gait Training: 8-22 mins                    Mabeline Caras, PT, DPT Acute Rehabilitation Services  Pager [phone] Office [place] 12/21/2018, 2:13 PM

## 2018-12-21 NOTE — Progress Notes (Signed)
[facility] Progress Note    Assessment/ Plan:   85 year old AA female who presented for shortness of breath. Had symptomatic bradycardia episode overnight requiring transfer to ICU. Apparent etiology appears to be coreg and cosopt eye drops. Now resolved.   1. Shortness of breath 2/2 volume overload:  Volume overload on cxr and initial lung exam. Now off bipap. Echo showing EF 55-60%.  She is clinically improved and per I/Os is net neg 1.9L for the admission.  On RA -Looks euvolemic today. -Cont bumex 2 BID  2. Symptomatic bradycardia Etiology felt to be due to coreg, cosopt drops, and nitro gtt. Now asymptomatic. Seems to have resolved. - Management per primary team  3. CKD stage V:  Follows with Dr. Moshe Cipro outpatient.  Has opted for medical management and no dialysis.   Renal function remains stable.  She can f/u outpatient after discharge.   4. Combined systolic and diastolic heart failure Echo with grade 1 diastolic dysfunction, EF 82-99% -Changed to bumetanide  - Per primary  5. Hypertension: variable over past 24h with BPs 120s - 180s.  -Amlodipine 10 mg -Off carvedilol with bradycardia.  Okay with hydralazine. -Bumetanide twice daily  6.  Anemia:  Hb trending up form 7.1 to 8.3 today.  Was generally in the 9s until this admission.  Iron level adequate.  Follow.   OK for discharge from my perspective. It sounds like she had f/u with Dr. Moshe Cipro today or tomorrow.  Will reschedule for 1-2 weeks.   Subjective:   Feeling better this am.  Ambulated in hall and on stairs with PT. No trouble breathing off O2.  Currently denies headaches, fevers, chills, chest pain, shortness of breath, nausea, vomiting, diarrhea or dysuria.  Denies any skin rash, arthralgias or myalgias.  All other review of systems are negative.   Objective:   BP (!) 158/69   Pulse 66   Temp 98.4 F (36.9 C) (Oral)   Resp (!) 27   Ht 5\' 3"  (1.6 m)   Wt 60.5 kg   SpO2 96%    BMI 23.63 kg/m   Intake/Output Summary (Last 24 hours) at 12/21/2018 1320 Last data filed at 12/21/2018 0500 Gross per 24 hour  Intake 120 ml  Output 550 ml  Net -430 ml   Weight change: -0.149 kg  Physical Exam: Gen: awake, alert x3. No acute distress, comfortable on nasal cannula CVS: rrr, no m/r/g. Trace pitting edema bilateral lower extremity. Palpable dp bilaterally Resp: Minimal bibasilar crackles, no accessory muscle use on  Abd: s/nt/nd. Ext: No lower extremity edema, warm/dry.  Skin: No rashes Neuro: Conversant, no focal deficits Psych:  Normal affect  Imaging: No results found.  Labs: BMET Recent Labs  Lab 12/16/18 0829 12/16/18 3716 12/16/18 0857 12/16/18 1006 12/16/18 2100 12/17/18 0557 12/18/18 0519 12/19/18 0649 12/20/18 0753  NA 142 138  --  146* 144 146* 143 144 145  K 4.2 >8.5*  --  4.3 4.2 4.2 4.0 4.8 4.3  CL 107  --   --  108 109 106 108 105 105  CO2 24  --   --  20* 24 24 26 27 26   GLUCOSE 179*  --   --  149* 135* 142* 107* 112* 119*  BUN 71*  --   --  74* 77* 80* 83* 84* 81*  CREATININE 5.39*  --  5.60* 5.28* 5.30* 5.37* 5.41* 5.38* 5.33*  CALCIUM 8.8*  --   --  9.1 8.2* 8.6* 8.0* 8.5*  8.9  PHOS  --   --   --  5.9*  --  7.0* 5.1*  --   --    CBC Recent Labs  Lab 12/16/18 0829  12/17/18 0557 12/18/18 0519 12/19/18 0649 12/20/18 0753  WBC 8.0   < > 6.4 4.8 4.8 4.6  NEUTROABS 5.5  --   --   --   --   --   HGB 9.2*   < > 7.9* 7.1* 7.5* 8.3*  HCT 31.6*   < > 27.3* 23.8* 26.1* 27.9*  MCV 90.8   < > 91.3 90.5 90.0 90.3  PLT 201   < > 155 145* 145* 177   < > = values in this interval not displayed.    Medications:    . anastrozole  1 mg Oral Daily  . aspirin  81 mg Oral Daily  . bumetanide  2 mg Oral BID  . calcium-vitamin D  1 tablet Oral Q breakfast  . feeding supplement (GLUCERNA SHAKE)  237 mL Oral BID BM  . heparin  5,000 Units Subcutaneous Q8H  . hydrALAZINE  25 mg Oral Q8H  . insulin aspart  0-5 Units Subcutaneous QHS  .  insulin aspart  0-9 Units Subcutaneous TID WC  . latanoprost  1 drop Both Eyes QHS  . levothyroxine  50 mcg Oral QAC breakfast  . magnesium oxide  400 mg Oral BID  . mouth rinse  15 mL Mouth Rinse BID  . pantoprazole  40 mg Oral Daily  . polyethylene glycol  17 g Oral Daily  . senna-docusate  1 tablet Oral BID  . simvastatin  20 mg Oral QPM  . sodium chloride flush  3 mL Intravenous Q12H

## 2018-12-21 NOTE — Progress Notes (Signed)
Physical Therapy Treatment Patient Details Name: Mandy Larson MRN: [id number] DOB: September 17, 1930 Today's Date: 12/21/2018    History of Present Illness Patient is a 85 y/o female who presents 12/16/18 with SOB. Had an episode of symptomatic bradycardia, code team called. Admitted with acute respiratory failure with respiratory distress. CXR- increased bilateral vascular prominence and bil effusions. PMH includes mitral regurgitation, HTN, DM, CKD, CAD, anemia.    PT Comments    Pt progressing with mobility. Initial gait training with SPC, but pt requires min guard and additional UE support for stability; agreeable to use RW for further gait training, supervision-level with this. Discussed recommendation to use RW/rollator at home; pt reluctant but agreeable and understands reasoning. Will plan for stair training next session as pt has multiple steps inside home to bedroom. Discussed potential for SNF, but politely declining.   Follow Up Recommendations  Home health PT;Supervision for mobility/OOB     Equipment Recommendations  Rolling walker with 5" wheels    Recommendations for Other Services       Precautions / Restrictions Precautions Precautions: Fall Restrictions Weight Bearing Restrictions: No    Mobility  Bed Mobility Overal bed mobility: Modified Independent Bed Mobility: Supine to Sit     Supine to sit: Modified independent (Device/Increase time);HOB elevated        Transfers Overall transfer level: Needs assistance Equipment used: Straight cane Transfers: Sit to/from Stand Sit to Stand: Min guard         General transfer comment: Min guard for safety, increased effort; needed HHA to maintain balance upon standing from bed. Stood from low toilet height with SPC, reliant on rail for RUE support to pull into standing; min guard  Ambulation/Gait Ambulation/Gait assistance: Min Gaffer (Feet): 120 Feet Assistive device: Straight  cane;Rolling walker (2 wheeled) Gait Pattern/deviations: Step-through pattern;Decreased stride length;Trunk flexed Gait velocity: Decreased Gait velocity interpretation: <1.31 ft/sec, indicative of household ambulator General Gait Details: Amb in room with SPC and close min guard for balance, pt reaching to furniture for support of other UE. Hallway ambulation with RW, progressing to supervision for safety; very slow, guarded gait. Pt reports minimal fatigue. SpO2 >/88% on RA   Stairs             Wheelchair Mobility    Modified Rankin (Stroke Patients Only)       Balance Overall balance assessment: Needs assistance   Sitting balance-Leahy Scale: Good       Standing balance-Leahy Scale: Poor Standing balance comment: Reliant on UE support                            Cognition Arousal/Alertness: Awake/alert Behavior During Therapy: WFL for tasks assessed/performed Overall Cognitive Status: Within Functional Limits for tasks assessed                                        Exercises      General Comments General comments (skin integrity, edema, etc.): Granddaughter present and supportive      Pertinent Vitals/Pain Pain Assessment: No/denies pain    Home Living                      Prior Function            PT Goals (current goals can now be found in the care plan section) Acute Rehab  PT Goals Patient Stated Goal: to go home PT Goal Formulation: With patient Time For Goal Achievement: 01/01/19 Potential to Achieve Goals: Good Progress towards PT goals: Progressing toward goals    Frequency    Min 3X/week      PT Plan Current plan remains appropriate    Co-evaluation              AM-PAC PT "6 Clicks" Mobility   Outcome Measure  Help needed turning from your back to your side while in a flat bed without using bedrails?: None Help needed moving from lying on your back to sitting on the side of a flat bed  without using bedrails?: None Help needed moving to and from a bed to a chair (including a wheelchair)?: A Little Help needed standing up from a chair using your arms (e.g., wheelchair or bedside chair)?: A Little Help needed to walk in hospital room?: A Little Help needed climbing 3-5 steps with a railing? : A Lot 6 Click Score: 19    End of Session Equipment Utilized During Treatment: Gait belt Activity Tolerance: Patient tolerated treatment well Patient left: with call bell/phone within reach;with family/visitor present;in bed Nurse Communication: Mobility status PT Visit Diagnosis: Difficulty in walking, not elsewhere classified (R26.2);Muscle weakness (generalized) (M62.81)     Time: 1840-3754 PT Time Calculation (min) (ACUTE ONLY): 26 min  Charges:  $Gait Training: 8-22 mins $Therapeutic Activity: 8-22 mins                    Mabeline Caras, PT, DPT Acute Rehabilitation Services  Pager [phone] Office [place] 12/21/2018, 12:29 PM

## 2018-12-21 NOTE — Care Management Note (Addendum)
Case Management Note  Patient Details  Name: Mandy Larson MRN: [id number] Date of Birth: [birth year]  Subjective/Objective: Pt presented for Acute Respiratory Failure. PTA independent from home. Patient has family support. Able to get medications and has PCP Mandy Larson.                  Action/Plan: CM did offer choice for [place]- via Medicare.Gov List. Mandy Larson accepted [facility]. SOC to begin within 24-48 hours post transition home. DME RW will be delivered to room prior to transition home. No further needs from CM at this time.   Expected Discharge Date:  12/21/18               Expected Discharge Plan:  [place]  In-House Referral:  NA  Discharge planning Services  CM Consult  Post Acute Care Choice:  Home Health Choice offered to:  Patient  DME Arranged:  Walker rolling DME Agency:  [place]:  PT [facility] Agency:  [facility]  Status of Service:  Completed, signed off  If discussed at [place] of Stay Meetings, dates discussed:    Additional Comments: Mandy Larson unable to accept the patient. CM did send referral to [facility] with [facility]. SOC to begin within 36 hours- insurance will need to be checked.  Mandy Roys, RN 12/21/2018, 3:43 PM

## 2018-12-21 NOTE — Plan of Care (Signed)
  Problem: Education: Goal: Knowledge of General Education information will improve Description Including pain rating scale, medication(s)/side effects and non-pharmacologic comfort measures Outcome: Progressing   

## 2018-12-21 NOTE — Discharge Summary (Signed)
Discharge Summary  Mandy Larson KYH:[id number] DOB: 08-12-1930  PCP: [name], MD  Admit date: 12/16/2018 Discharge date: 12/21/2018   Time spent: < 25 minutes  Admitted From: Home Disposition:  Home, with HHPT, DME ( rolling walker,)  Recommendations for Outpatient Follow-up:  1. Follow up with PCP in 1 week, check BMP, discuss BP meds and BP check 2. Discontinued carvedilol,  timolol eye drops and imdur due to profoundly symptomatic bradycardia 3. Advised to continue holding amlodipine until PCP checked as amlodipine does have less than 1% adverse reaction of bradycardia. 4. Bumex new diuretic prescriibed 5. F/u with nephrology already arranged 6.     Discharge Diagnoses:  Active Hospital Problems   Diagnosis Date Noted  . Acute respiratory failure ([place]) 01/31/2018  . Symptomatic sinus bradycardia   . Chronic combined systolic and diastolic CHF (congestive heart failure) ([place]) 11/08/2012  . Pulmonary hypertension ([place]) 11/08/2012  . Mitral regurgitation 11/08/2012  . DM2 (diabetes mellitus, type 2) ([place]) 11/06/2012  . Hypertensive heart and chronic kidney disease with heart failure and stage 1 through stage 4 chronic kidney disease, or chronic kidney disease ([place]) 11/06/2012  . CKD (chronic kidney disease) stage 4, GFR 15-29 ml/min ([facility]) 11/06/2012    Resolved Hospital Problems  No resolved problems to display.    Discharge Condition: Stable   CODE STATUS:FULL    History of present illness:  Mandy Larson is a 85 y.o. year old female with medical history significant for chronic combined systolic/diastolic CHF, CKD stage V, HTN, HLD, diabetes, ER positive breast cancer on anastrozole who presented on 12/16/2018 with 2 weeks of progressive dyspnea with minimal exertion  and was found to have acute hypoxic respiratory failure secondary to pulmonary edema.  During hospitalization patient was initially started on BiPAP for respiratory distress (Co2 56.5 on ABG and Ph  7.280)and supplemental oxygen, IV Lasix.   Remaining hospital course addressed in problem based format below:   Hospital Course:   1.   Symptomatic bradycardia,resolved.  Her course was complicated by decreased heart rate and unresponsiveness due to symptomatic bradycardic episode requiring atropine x2 and transcutaneous pacing with transfer to ICU on 2/6.  Bradycardia presumed secondary to beta-blockade (home Coreg and Cosopt eyedrops (dorzolamide-timolol) and nitro drip started here).  In the ICU she was put on dopamine drip.  Reversal of nitro with phenylephrine infusion and glucagon given for reversal beta-blockade with return to normal heart rate.  Her heart rate remained normal while holding beta-blocker, calcium channel blocker and eyedrops.  Presumed secondary to home Coreg, amlodipine, and timolol eyedrops.  Coreg and timolol eyedrops discontinued on discharge.  Advised to continue holding amlodipine until PCP checked as amlodipine does have less than 1% adverse reaction of bradycardia.  2. Acute hypoxic/hypercarbic respiratory failure secondary to volume overload related to acute exacerbation of CHF and CKD, resolved.    Initially upon admission was on BiPAP for respiratory distress.  Minimal response to output with IV Lasix was able to transition to Bumex after ICU stay for bradycardia addressed above.  With continued diuresis with Bumex was able to transition off of oxygen to room air, was able to ambulate without any additional supplementation.  Home health physical therapy rolling walker provided on discharge.  Willcontinue Bumex on discharge  3. Hypertensive CKD stage V. Not interested in dialysis, normal output with Bumex.  Has outpatient nephrology appointment arranged.  4. Combined systolic and diastolic heart failure, stable.  TTE shows preserved EF (55-85% on 12/16/2018) with pseudonormalization of diastolic relaxation.  BNP 914.  Initially diuresed with Lasix with only minimal output, had  improved urinary output and improvement in dyspnea with Bumex dosing which she will continue on discharge   5. Hypertension, improving.  Discontinued previous regimen of carvedilol.  Instructed to hold on amlodipine until PCP evaluation (given less than 1% adverse reaction of bradycardia) Continue hydralazine 25 mg 3 times daily (uptitrate as needed pending PCP follow-up)  6. History of breast cancer continue anastrozole  7. Type 2 diabetes.  Hold home glipizide continue sliding scale, monitor CBG.   8. GERD, stable.  Continue Protonix  9. Hyperlipidemia, stable continue Zocor  10. Hypothyroidism.  TSH within normal limits   Consultations:  Nephrology, critical care  Procedures/Studies: TTE, 2/6, EF 50-55%    1. The left ventricle has low normal systolic function of 16-10%. The cavity size was mildly increased. There is mildly increased left ventricular wall thickness. Echo evidence of pseudonormalization in diastolic relaxation. Basal to mid inferolateral  akinesis and basal anterolateral hypokinesis.  2. The right ventricle has normal systolic function. The cavity was normal. There is no increase in right ventricular wall thickness.  3. Left atrial size was moderately dilated.  4. Right atrial size was mildly dilated.  5. Trivial pericardial effusion.  6. The mitral valve is normal in structure. There is mild mitral annular calcification present. Mitral valve regurgitation is moderate by color flow Doppler. No evidence of mitral valve stenosis. Suspect infarct-related MR with lateral wall motion  abnormality.  7. The aortic valve is tricuspid There is mild calcification of the aortic valve. No aortic stenosis.  8. The pulmonic valve was normal in structure.  9. The aortic root and ascending aorta are normal in size and structure. 10. The inferior vena cava is normal in size with greater than 50% respiratory variability. 11. PA systolic pressure 45 mmHg. 12. The tricuspid valve  is normal in structure  Discharge Exam: BP (!) 171/77 (BP Location: Left Arm)   Pulse 70   Temp 97.9 F (36.6 C) (Oral)   Resp (!) 36   Ht 5\' 3"  (1.6 m)   Wt 60.5 kg   SpO2 97%   BMI 23.63 kg/m    General:  Elderly female, no distress, lying in bed  Cardiovascular: SEM loudest in 2nd RICS, RRR, no peripheral edema  Respiratory: normal  respiratory effort on room air, minimal crackles at bases   Abdomen: soft, normal bowel sounds  Musculoskeletal: normal ROM   Skin no rashes or lesions  Neurologic no appreciable focal deficits, following commands   Discharge Instructions You were cared for by a hospitalist during your hospital stay. If you have any questions about your discharge medications or the care you received while you were in the hospital after you are discharged, you can call the unit and asked to speak with the hospitalist on call if the hospitalist that took care of you is not available. Once you are discharged, your primary care physician will handle any further medical issues. Please note that NO REFILLS for any discharge medications will be authorized once you are discharged, as it is imperative that you return to your primary care physician (or establish a relationship with a primary care physician if you do not have one) for your aftercare needs so that they can reassess your need for medications and monitor your lab values.  Discharge Instructions    Diet - low sodium heart healthy   Complete by:  As directed    Increase activity slowly  Complete by:  As directed      Allergies as of 12/21/2018   No Known Allergies     Medication List    STOP taking these medications   carvedilol 3.125 MG tablet Commonly known as:  COREG   dorzolamide-timolol 22.3-6.8 MG/ML ophthalmic solution Commonly known as:  COSOPT   furosemide 20 MG tablet Commonly known as:  LASIX   isosorbide mononitrate 30 MG 24 hr tablet Commonly known as:  IMDUR     TAKE these  medications   acetaminophen 325 MG tablet Commonly known as:  TYLENOL Take 325 mg by mouth every 6 (six) hours as needed for mild pain or headache.   amLODipine 10 MG tablet Commonly known as:  NORVASC DO NOT TAKE UNTIL SEEN BY PCP What changed:    how much to take  how to take this  when to take this  additional instructions   anastrozole 1 MG tablet Commonly known as:  ARIMIDEX Take 1 tablet (1 mg total) by mouth daily.   aspirin 81 MG chewable tablet Chew 81 mg by mouth daily.   bumetanide 2 MG tablet Commonly known as:  BUMEX Take 1 tablet (2 mg total) by mouth 2 (two) times daily.   CALTRATE 600+D 600-800 MG-UNIT Tabs Generic drug:  Calcium Carb-Cholecalciferol Take 1 tablet by mouth daily.   feeding supplement (GLUCERNA SHAKE) Liqd Take 237 mLs by mouth 2 (two) times daily between meals.   glipiZIDE 5 MG tablet Commonly known as:  GLUCOTROL Take 2.5 mg by mouth daily before breakfast.   hydrALAZINE 25 MG tablet Commonly known as:  APRESOLINE Take 1 tablet (25 mg total) by mouth every 8 (eight) hours.   latanoprost 0.005 % ophthalmic solution Commonly known as:  XALATAN Place 1 drop into both eyes at bedtime.   levothyroxine 50 MCG tablet Commonly known as:  SYNTHROID, LEVOTHROID Take 50 mcg by mouth daily.   magnesium oxide 400 (241.3 Mg) MG tablet Commonly known as:  MAG-OX Take 1 tablet (400 mg total) by mouth 2 (two) times daily for 4 days.   pantoprazole 40 MG tablet Commonly known as:  PROTONIX Take 1 tablet (40 mg total) by mouth daily.   polyethylene glycol packet Commonly known as:  MIRALAX / GLYCOLAX Take 17 g by mouth daily as needed.   potassium chloride 10 MEQ tablet Commonly known as:  K-DUR,KLOR-CON Take 10 mEq by mouth daily.   RESTASIS 0.05 % ophthalmic emulsion Generic drug:  cycloSPORINE Place 1 drop into both eyes every 12 (twelve) hours.   senna-docusate 8.6-50 MG tablet Commonly known as:  Senokot-S Take 1 tablet by  mouth at bedtime.   simvastatin 20 MG tablet Commonly known as:  ZOCOR Take 20 mg by mouth every evening.            Durable Medical Equipment  (From admission, onward)         Start     Ordered   12/21/18 1539  For home use only DME Walker rolling  Once    Question:  Patient needs a walker to treat with the following condition  Answer:  Dependent on walker for ambulation   12/21/18 1539         No Known Allergies Follow-up Information    [facility] Follow up.   Specialty:  Home Health Services Why:  Physical Therapy Contact information: [place] [address] [phone]        [facility] -  Dme Follow up.   Why:  Best boy information: [address] [place] [address] [phone]            The results of significant diagnostics from this hospitalization (including imaging, microbiology, ancillary and laboratory) are listed below for reference.    Significant Diagnostic Studies: Dg Chest Port 1 View  Result Date: 12/17/2018 CLINICAL DATA:  New acute respiratory EXAM: PORTABLE CHEST 1 VIEW COMPARISON:  To 12/16/2018 FINDINGS: Cardiomegaly as seen yesterday. Aortic atherosclerosis. Slight reduction in interstitial edema. Mild edema persists. Mild volume loss at the bases. There may be a small amount of pleural fluid on the left. IMPRESSION: Improved congestive heart failure pattern with less interstitial edema. Electronically Signed   By: Nelson Chimes M.D.   On: 12/17/2018 06:34   Dg Chest Portable 1 View  Result Date: 12/16/2018 CLINICAL DATA:  Respiratory distress. EXAM: PORTABLE CHEST 1 VIEW COMPARISON:  01/31/2018. FINDINGS: Cardiomegaly with diffuse increased bilateral from interstitial prominence. Cardiac B-lines noted. Bilateral pleural effusions. Findings consistent with CHF. Bilateral carotid vascular calcification. IMPRESSION: 1. Congestive heart failure with bilateral  pulmonary interstitial edema bilateral pleural effusions. 2.  Carotid vascular disease. Electronically Signed   By: Marcello Moores  Register   On: 12/16/2018 08:53    Microbiology: Recent Results (from the past 240 hour(s))  MRSA PCR Screening     Status: None   Collection Time: 12/16/18  4:19 PM  Result Value Ref Range Status   MRSA by PCR NEGATIVE NEGATIVE Final    Comment:        The GeneXpert MRSA Assay (FDA approved for NASAL specimens only), is one component of a comprehensive MRSA colonization surveillance program. It is not intended to diagnose MRSA infection nor to guide or monitor treatment for MRSA infections. Performed at [facility], [place] [address] [place], [place] [address]      Labs: Basic Metabolic Panel: Recent Labs  Lab 12/16/18 [phone]  12/16/18 1006 12/16/18 2100 12/17/18 0557 12/18/18 0519 12/19/18 0649 12/20/18 0753  NA  --   --  146* 144 146* 143 144 145  K  --   --  4.3 4.2 4.2 4.0 4.8 4.3  CL  --   --  108 109 106 108 105 105  CO2  --   --  20* 24 24 26 27 26   GLUCOSE  --   --  149* 135* 142* 107* 112* 119*  BUN  --   --  74* 77* 80* 83* 84* 81*  CREATININE  --    < > 5.28* 5.30* 5.37* 5.41* 5.38* 5.33*  CALCIUM  --   --  9.1 8.2* 8.6* 8.0* 8.5* 8.9  MG 1.8  --   --   --  1.8 1.6*  --   --   PHOS  --   --  5.9*  --  7.0* 5.1*  --   --    < > = values in this interval not displayed.   Liver Function Tests: Recent Labs  Lab 12/16/18 0829 12/16/18 1006  AST 26  --   ALT 20  --   ALKPHOS 60  --   BILITOT 0.6  --   PROT 7.4  --   ALBUMIN 3.8 3.6   No results for input(s): LIPASE, AMYLASE in the last 168 hours. No results for input(s): AMMONIA in the last 168 hours. CBC: Recent Labs  Lab 12/16/18 3212  12/16/18 2100 12/17/18 2482 12/18/18 5003 12/19/18 7048 12/20/18 8891  WBC 8.0  --  8.2 6.4 4.8 4.8 4.6  NEUTROABS 5.5  --   --   --   --   --   --   HGB 9.2*   < > 8.3* 7.9* 7.1* 7.5* 8.3*  HCT 31.6*   < > 28.5* 27.3* 23.8* 26.1*  27.9*  MCV 90.8  --  91.6 91.3 90.5 90.0 90.3  PLT 201  --  178 155 145* 145* 177   < > = values in this interval not displayed.   Cardiac Enzymes: Recent Labs  Lab 12/16/18 2100  TROPONINI 0.05*   BNP: BNP (last 3 results) Recent Labs    01/31/18 0837 12/16/18 0829  BNP 788.0* 914.0*    ProBNP (last 3 results) No results for input(s): PROBNP in the last 8760 hours.  CBG: Recent Labs  Lab 12/20/18 1136 12/20/18 1707 12/20/18 2119 12/21/18 0739 12/21/18 1140  GLUCAP 201* 107* 133* 123* 167*       Signed:  Desiree Hane, MD [facility] 12/21/2018, 3:43 PM

## 2018-12-22 ENCOUNTER — Inpatient Hospital Stay (HOSPITAL_COMMUNITY): Admission: RE | Admit: 2018-12-22 | Payer: Medicare HMO | Source: Ambulatory Visit

## 2019-01-03 NOTE — Progress Notes (Signed)
No show

## 2019-01-04 ENCOUNTER — Inpatient Hospital Stay: Payer: Medicare HMO | Attending: Oncology

## 2019-01-04 ENCOUNTER — Encounter: Payer: Self-pay | Admitting: Oncology

## 2019-01-04 ENCOUNTER — Inpatient Hospital Stay: Payer: Medicare HMO | Admitting: Oncology

## 2019-01-09 DEATH — deceased

## 2019-01-17 ENCOUNTER — Encounter (HOSPITAL_COMMUNITY): Payer: Self-pay

## 2019-01-17 ENCOUNTER — Ambulatory Visit: Payer: Medicare HMO | Admitting: Cardiovascular Disease

## 2019-03-25 ENCOUNTER — Ambulatory Visit: Payer: Medicare HMO | Admitting: Podiatry

## 2019-10-05 IMAGING — DX DG CHEST 1V PORT
1 series · 1 of 1 positions shown · non-contrast
Comparison: 01/31/2018.

CLINICAL DATA: Respiratory distress.

EXAM:
PORTABLE CHEST 1 VIEW

[chest ap]
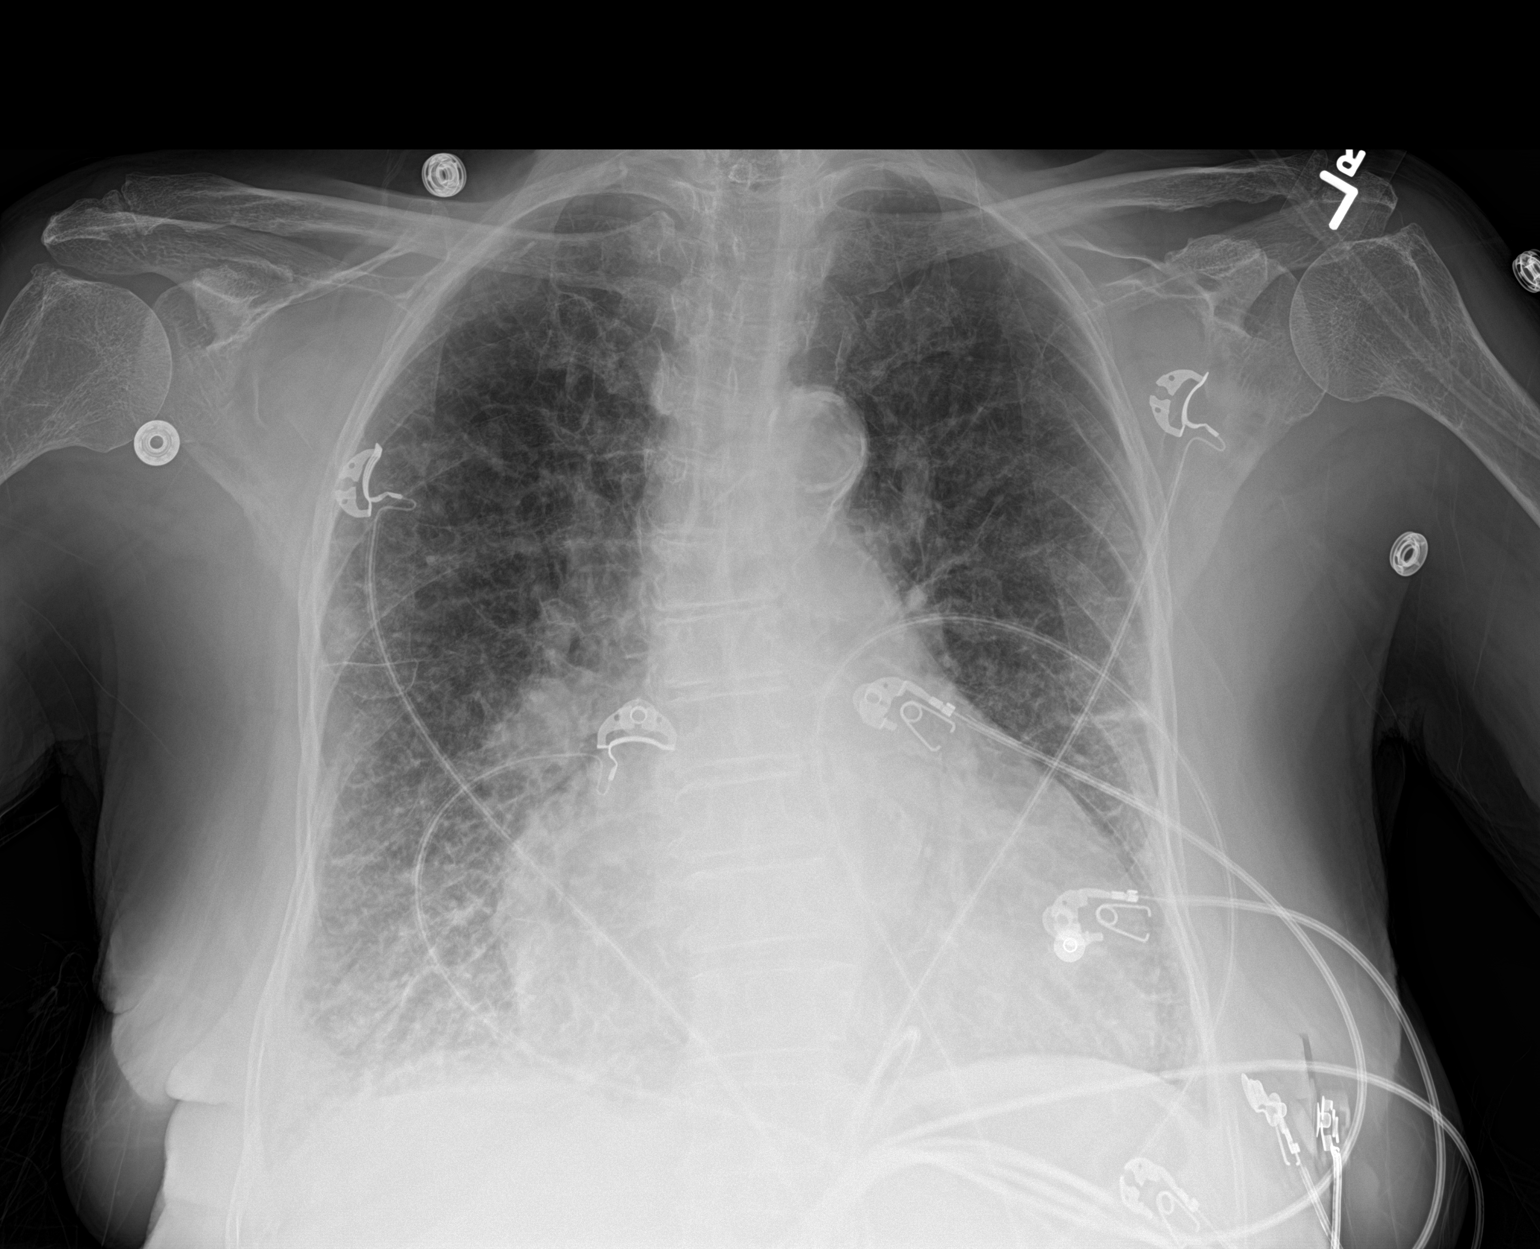

[1 of 1 positions shown; findings below may reference images not displayed]

FINDINGS: Cardiomegaly with diffuse increased bilateral from interstitial
prominence. Cardiac B-lines noted. Bilateral pleural effusions.
Findings consistent with CHF. Bilateral carotid vascular
calcification.
IMPRESSION: 1. Congestive heart failure with bilateral pulmonary interstitial
edema bilateral pleural effusions.

2.  Carotid vascular disease.

## 2019-10-06 IMAGING — DX DG CHEST 1V PORT
1 series · 1 of 1 positions shown · non-contrast
Comparison: To 12/16/2018

CLINICAL DATA: New acute respiratory

EXAM:
PORTABLE CHEST 1 VIEW

[chest ap]
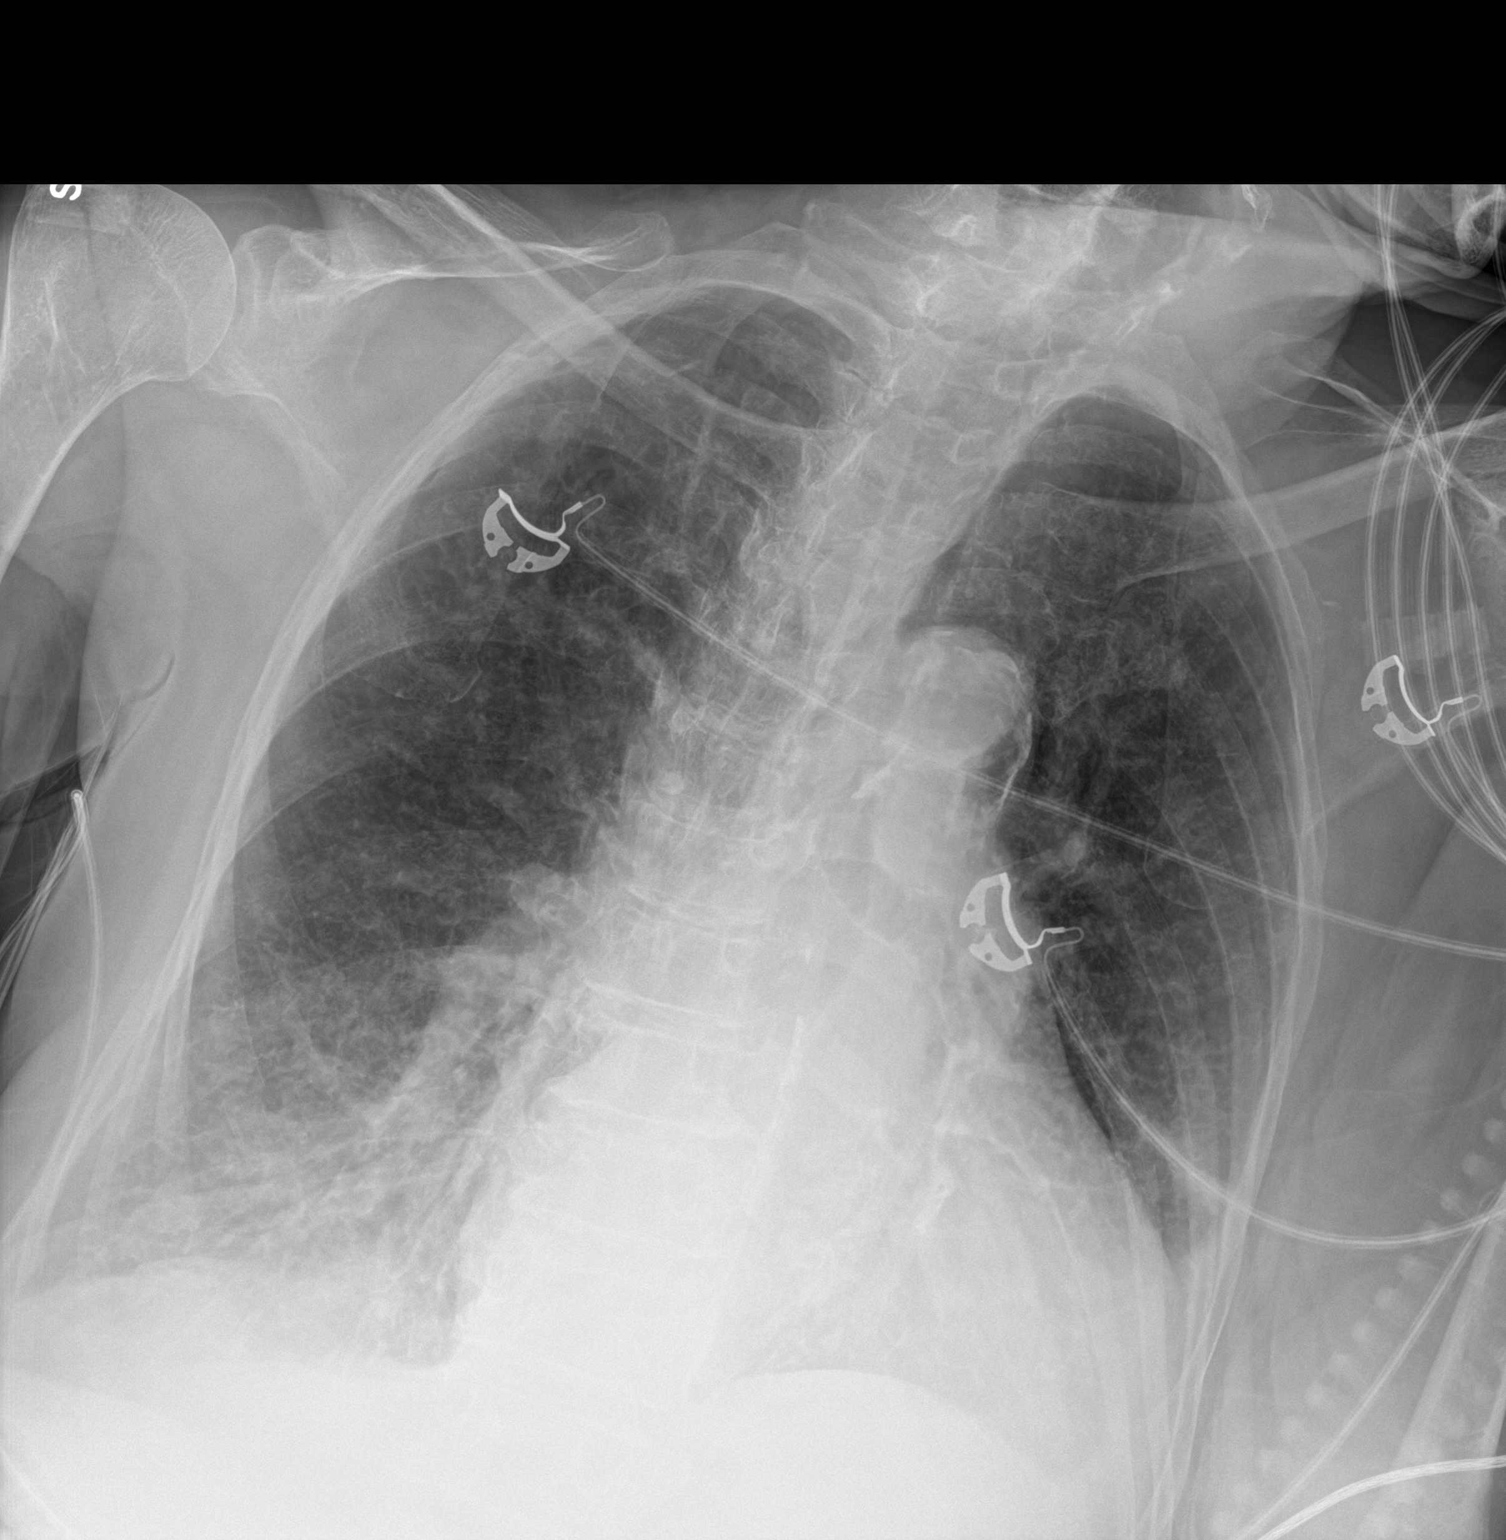

[1 of 1 positions shown; findings below may reference images not displayed]

FINDINGS: Cardiomegaly as seen yesterday. Aortic atherosclerosis. Slight
reduction in interstitial edema. Mild edema persists. Mild volume
loss at the bases. There may be a small amount of pleural fluid on
the left.
IMPRESSION: Improved congestive heart failure pattern with less interstitial
edema.
